# Patient Record
Sex: Female | Born: 1935 | Race: White | Hispanic: No | Smoking: Never smoker
Health system: Southern US, Academic
[De-identification: ages and names within clinical notes are randomized; demographics above are authoritative.]

## PROBLEM LIST (undated history)

## (undated) DIAGNOSIS — E669 Obesity, unspecified: Secondary | ICD-10-CM

## (undated) DIAGNOSIS — E119 Type 2 diabetes mellitus without complications: Secondary | ICD-10-CM

## (undated) DIAGNOSIS — I4891 Unspecified atrial fibrillation: Secondary | ICD-10-CM

## (undated) DIAGNOSIS — M129 Arthropathy, unspecified: Secondary | ICD-10-CM

## (undated) DIAGNOSIS — H409 Unspecified glaucoma: Secondary | ICD-10-CM

## (undated) DIAGNOSIS — M25561 Pain in right knee: Secondary | ICD-10-CM

## (undated) DIAGNOSIS — Z973 Presence of spectacles and contact lenses: Secondary | ICD-10-CM

## (undated) DIAGNOSIS — H9190 Unspecified hearing loss, unspecified ear: Secondary | ICD-10-CM

## (undated) DIAGNOSIS — M1990 Unspecified osteoarthritis, unspecified site: Secondary | ICD-10-CM

## (undated) DIAGNOSIS — I639 Cerebral infarction, unspecified: Secondary | ICD-10-CM

## (undated) DIAGNOSIS — I10 Essential (primary) hypertension: Secondary | ICD-10-CM

## (undated) DIAGNOSIS — IMO0002 Reserved for concepts with insufficient information to code with codable children: Secondary | ICD-10-CM

## (undated) DIAGNOSIS — Z96659 Presence of unspecified artificial knee joint: Secondary | ICD-10-CM

## (undated) DIAGNOSIS — T8459XA Infection and inflammatory reaction due to other internal joint prosthesis, initial encounter: Secondary | ICD-10-CM

## (undated) HISTORY — DX: Infection and inflammatory reaction due to other internal joint prosthesis, initial encounter: T84.59XA

## (undated) HISTORY — PX: JOINT REPLACEMENT: SHX530

## (undated) HISTORY — DX: Presence of unspecified artificial knee joint: Z96.659

## (undated) HISTORY — PX: FRACTURE SURGERY: SHX138

## (undated) HISTORY — PX: HX HYSTERECTOMY: SHX81

## (undated) HISTORY — PX: HX HIP REPLACEMENT: SHX124

## (undated) HISTORY — PX: HX KNEE REPLACMENT: SHX125

## (undated) HISTORY — PX: HX VEIN STRIPPING: SHX48

---

## 2012-07-08 HISTORY — PX: ORIF FEMUR DECOMPRESSION: SHX220

## 2013-05-31 ENCOUNTER — Emergency Department (HOSPITAL_BASED_OUTPATIENT_CLINIC_OR_DEPARTMENT_OTHER): Payer: Medicare Other

## 2013-05-31 ENCOUNTER — Inpatient Hospital Stay (HOSPITAL_BASED_OUTPATIENT_CLINIC_OR_DEPARTMENT_OTHER)
Admission: EM | Admit: 2013-05-31 | Discharge: 2013-06-09 | DRG: 481 | Disposition: A | Discharge status: Skilled Nursing Facility | Payer: Medicare Other | Attending: Orthopaedic Surgery | Admitting: Orthopaedic Surgery

## 2013-05-31 ENCOUNTER — Encounter (HOSPITAL_BASED_OUTPATIENT_CLINIC_OR_DEPARTMENT_OTHER): Payer: Self-pay

## 2013-05-31 DIAGNOSIS — D62 Acute posthemorrhagic anemia: Secondary | ICD-10-CM | POA: Diagnosis not present

## 2013-05-31 DIAGNOSIS — M9701XA Periprosthetic fracture around internal prosthetic right hip joint, initial encounter: Secondary | ICD-10-CM | POA: Diagnosis present

## 2013-05-31 DIAGNOSIS — I4891 Unspecified atrial fibrillation: Secondary | ICD-10-CM | POA: Diagnosis present

## 2013-05-31 DIAGNOSIS — Z96659 Presence of unspecified artificial knee joint: Secondary | ICD-10-CM

## 2013-05-31 DIAGNOSIS — Z96649 Presence of unspecified artificial hip joint: Secondary | ICD-10-CM

## 2013-05-31 DIAGNOSIS — Y9229 Other specified public building as the place of occurrence of the external cause: Secondary | ICD-10-CM

## 2013-05-31 DIAGNOSIS — I69998 Other sequelae following unspecified cerebrovascular disease: Secondary | ICD-10-CM

## 2013-05-31 DIAGNOSIS — T84029A Dislocation of unspecified internal joint prosthesis, initial encounter: Secondary | ICD-10-CM | POA: Diagnosis present

## 2013-05-31 DIAGNOSIS — H409 Unspecified glaucoma: Secondary | ICD-10-CM | POA: Diagnosis present

## 2013-05-31 DIAGNOSIS — R29898 Other symptoms and signs involving the musculoskeletal system: Secondary | ICD-10-CM | POA: Diagnosis present

## 2013-05-31 DIAGNOSIS — M129 Arthropathy, unspecified: Secondary | ICD-10-CM | POA: Diagnosis present

## 2013-05-31 DIAGNOSIS — Z6831 Body mass index (BMI) 31.0-31.9, adult: Secondary | ICD-10-CM

## 2013-05-31 DIAGNOSIS — Z23 Encounter for immunization: Secondary | ICD-10-CM

## 2013-05-31 DIAGNOSIS — E119 Type 2 diabetes mellitus without complications: Secondary | ICD-10-CM | POA: Diagnosis present

## 2013-05-31 DIAGNOSIS — Z794 Long term (current) use of insulin: Secondary | ICD-10-CM

## 2013-05-31 DIAGNOSIS — Z7901 Long term (current) use of anticoagulants: Secondary | ICD-10-CM

## 2013-05-31 HISTORY — DX: Obesity, unspecified: E66.9

## 2013-05-31 HISTORY — DX: Unspecified atrial fibrillation (CMS HCC): I48.91

## 2013-05-31 HISTORY — DX: Reserved for concepts with insufficient information to code with codable children: IMO0002

## 2013-05-31 HISTORY — DX: Presence of spectacles and contact lenses: Z97.3

## 2013-05-31 HISTORY — DX: Arthropathy, unspecified: M12.9

## 2013-05-31 HISTORY — DX: Cerebral infarction, unspecified (CMS HCC): I63.9

## 2013-05-31 HISTORY — DX: Unspecified glaucoma: H40.9

## 2013-05-31 HISTORY — DX: Unspecified hearing loss, unspecified ear: H91.90

## 2013-05-31 HISTORY — DX: Unspecified osteoarthritis, unspecified site: M19.90

## 2013-05-31 HISTORY — DX: Essential (primary) hypertension: I10

## 2013-05-31 HISTORY — DX: Type 2 diabetes mellitus without complications (CMS HCC): E11.9

## 2013-05-31 HISTORY — DX: Pain in right knee: M25.561

## 2013-05-31 MED ORDER — MORPHINE 4 MG/ML INJECTION SYRINGE
4.00 mg | INJECTION | INTRAMUSCULAR | Status: DC | PRN
Start: 2013-05-31 — End: 2013-06-09

## 2013-05-31 MED ORDER — SODIUM CHLORIDE 0.9 % (FLUSH) INJECTION SYRINGE
10.00 mL | INJECTION | Freq: Three times a day (TID) | INTRAMUSCULAR | Status: DC
Start: 2013-06-01 — End: 2013-06-09
  Administered 2013-06-01: 0 mL via INTRAVENOUS
  Administered 2013-06-01: 10 mL via INTRAVENOUS
  Administered 2013-06-01 – 2013-06-02 (×5): 0 mL via INTRAVENOUS
  Administered 2013-06-03 (×2): 10 mL via INTRAVENOUS
  Administered 2013-06-03 – 2013-06-04 (×2): 0 mL via INTRAVENOUS
  Administered 2013-06-04 – 2013-06-07 (×10): 10 mL via INTRAVENOUS
  Administered 2013-06-07: 0 mL via INTRAVENOUS
  Administered 2013-06-08 – 2013-06-09 (×4): 10 mL via INTRAVENOUS

## 2013-05-31 MED ORDER — SODIUM CHLORIDE 0.45 % INTRAVENOUS SOLUTION
INTRAVENOUS | Status: DC
Start: 2013-06-01 — End: 2013-06-04

## 2013-05-31 MED ORDER — OXYCODONE-ACETAMINOPHEN 5 MG-325 MG TABLET
1.00 | ORAL_TABLET | ORAL | Status: DC | PRN
Start: 2013-05-31 — End: 2013-06-09
  Administered 2013-06-01 – 2013-06-09 (×13): 1 via ORAL
  Filled 2013-05-31 (×13): qty 1

## 2013-05-31 MED ORDER — NITROGLYCERIN 0.4 MG SUBLINGUAL TABLET
0.40 mg | SUBLINGUAL_TABLET | SUBLINGUAL | Status: DC | PRN
Start: 2013-05-31 — End: 2013-06-09

## 2013-05-31 MED ORDER — TRAVOPROST 0.004 % EYE DROPS
1.00 [drp] | Freq: Every evening | OPHTHALMIC | Status: DC
Start: 2013-06-01 — End: 2013-06-03
  Administered 2013-06-01: 1 [drp] via OPHTHALMIC
  Administered 2013-06-02: 0 [drp] via OPHTHALMIC
  Filled 2013-05-31: qty 2.5

## 2013-05-31 MED ORDER — SODIUM CHLORIDE 0.9 % (FLUSH) INJECTION SYRINGE
10.0000 mL | INJECTION | Freq: Three times a day (TID) | INTRAMUSCULAR | Status: DC
Start: 2013-06-01 — End: 2013-06-01

## 2013-05-31 MED ORDER — ACETAMINOPHEN 325 MG TABLET
650.00 mg | ORAL_TABLET | Freq: Four times a day (QID) | ORAL | Status: DC | PRN
Start: 2013-05-31 — End: 2013-06-09

## 2013-05-31 MED ORDER — GLIPIZIDE 5 MG TABLET - EAST
5.00 mg | ORAL_TABLET | Freq: Every morning | ORAL | Status: DC
Start: 2013-06-01 — End: 2013-06-01

## 2013-05-31 MED ORDER — MORPHINE 4 MG/ML INJECTION SYRINGE
4.00 mg | INJECTION | INTRAMUSCULAR | Status: DC | PRN
Start: 2013-05-31 — End: 2013-06-01
  Administered 2013-06-01 (×2): 4 mg via INTRAVENOUS
  Filled 2013-05-31 (×3): qty 1

## 2013-05-31 MED ORDER — ATENOLOL 50 MG TABLET
50.0000 mg | ORAL_TABLET | Freq: Every day | ORAL | Status: DC
Start: 2013-06-01 — End: 2013-06-09
  Administered 2013-06-01 – 2013-06-09 (×9): 50 mg via ORAL
  Filled 2013-05-31 (×9): qty 1

## 2013-05-31 NOTE — ED Provider Notes (Signed)
 Janus Ozell RAMAN, MD  [facility]  Emergency Department Visit Note    Date:  05/31/2013  Primary care provider:  Pcp Not In System  Means of arrival:  ambulance  History obtained from: patient and husband   History limited by: none    Chief Complaint:  Fall     HISTORY OF PRESENT ILLNESS     Bridget Hall, date of birth 06/17/1925, is a 79 y.o. female who presents to the Emergency Department via EMS post ground level fall. The patient's husband states the patient slipped and fell when in their hotel bathroom. He states she had bilateral knee replacements and believes she may have dislocated her right knee. The patient states that when she fell her right leg, from the knee down, was at a 90 degree angle away from her body. She states she is now experiencing pain to her right knee that she rates as 10/10. She denies any hip pain, and states she did not hit her head and had no loss of consciousness.     REVIEW OF SYSTEMS     The pertinent positive and negative symptoms are as per HPI. All other systems reviewed and are negative.     PATIENT HISTORY     Past Medical History:  Past Medical History   Diagnosis Date   . A-fib    . Diabetes mellitus        Past Surgical History:  Past Surgical History   Procedure Laterality Date   . Hx hip replacement Right    . Hx knee replacment Bilateral    . Hx hysterectomy         Family History:  No family history of acute illness pertaining to the current visit given at this time.     Social History:  History   Substance Use Topics   . Smoking status: Never Smoker    . Smokeless tobacco: Not on file   . Alcohol Use: No     History   Drug Use No       Medications:  Previous Medications    No medications on file       Allergies:  No Known Allergies    PHYSICAL EXAM     Vitals:  Filed Vitals:    05/31/13 2231   BP: 126/88   Pulse: 82   Temp: 36.4 C (97.6 F)   Resp: 18   SpO2: 95%       Pulse ox  95% on None (Room Air) interpreted by me as: Normal    Constitutional:  The patient is alert and oriented to person, place, and time. Well-developed and well-nourished.  HENT: Atraumatic, normocephalic head. Mucous membranes moist. TM's clear, Nares unremarkable. Oropharynx shows no erythema or exudate.   Eyes: Pupils equal and round, reactive to light. No scleral icterus. Normal conjunctiva. Extraocular movements are intact.  Neck: Supple, non-tender, no nuchal rigidity, no adenopathy.   Lungs: Clear to auscultation bilaterally. Symmetric and equal expansion. No respiratory distress or retractions.  Cardiovascular: Heart is S1-S2 regular rate and regular rhythm without murmur click or rub.  Abdomen:  Soft, non-distended. No tenderness to palpation without evidence of rebound or guarding. No pulsatile masses. No organomegaly.   Genitourinary: No CVA tenderness.  Extremities: No clubbing, cyanosis, or edema. Pulses 2+, capillary refill <2 seconds. Right leg is shorter than the left with an obvious deformity at the knee.   Spine: No midline or paraspinal muscle tenderness to palpation. No step-off.  Skin: Warm and dry. No cyanosis, jaundice, rash or lesion.  Neurologic: Alert and oriented x3. Normal facial symmetry and speech, Normal upper and lower extremity strength, and grossly normal sensation.     DIFFERENTIAL DIAGNOSES     1. Knee dislocation   2. Knee fracture   3. Prosthetic malfunction   4. Knee contusion      DIAGNOSTIC STUDIES     Labs:    Results for orders placed during the hospital encounter of 05/31/13   CBC       Result Value Range    WBC 11.9 (*) 4.0 - 11.0 K/uL    RBC 4.92  4.00 - 5.10 M/uL    HGB 15.8 (*) 12.0 - 15.5 g/dL    HCT 50.3 (*) 63.9 - 45.0 %    MCV 101.0 (*) 82.0 - 97.0 fL    MCH 32.1  28.0 - 34.0 pg    MCHC 31.9 (*) 33.0 - 37.0 g/dL    RDW 87.2  88.9 - 86.9 %    PLATELET COUNT 284  150 - 400 K/uL    MPV 6.5 (*) 7.0 - 9.4 fL    PMN % 84.8 (*) 43.0 - 76.0 %    LYMPHOCYTE % 10.3 (*) 15.0 - 43.0 %    MONOCYTE % 3.9 (*) 4.8 - 12.0 %    EOSINOPHIL % 0.4  0.0 - 5.2  %    BASOPHILS % 0.5  0.0 - 1.4 %    PMN # 10.10 (*) 1.50 - 6.50 K/uL    LYMPHOCYTE # 1.23  0.70 - 3.20 K/uL    MONOCYTE # 0.47  0.20 - 0.90 K/uL    EOSINOPHIL # 0.05  0.00 - 0.50 K/uL    BASOPHIL # 0.06  0.00 - 0.10 K/uL   COMPREHENSIVE METABOLIC PROFILE - [facility] ONLY       Result Value Range    GLUCOSE 124 (*) 70 - 110 mg/dL    BUN 27 (*) 6 - 22 mg/dL    CREATININE 9.03  9.46 - 1.00 mg/dL    ESTIMATED GLOMERULAR FILTRATION RATE 55 (*) >60 ml/min    SODIUM 144  136 - 145 mmol/L    POTASSIUM 4.3  3.5 - 5.0 mmol/L    CHLORIDE 108  101 - 111 mmol/L    CARBON DIOXIDE 28  22 - 32 mmol/L    CALCIUM 9.4  8.5 - 10.5 mg/dL    TOTAL PROTEIN 5.9 (*) 6.0 - 8.0 g/dL    ALBUMIN 3.6  3.2 - 5.0 g/dL    BILIRUBIN, TOTAL 1.7 (*) 0.0 - 1.3 mg/dL    AST (SGOT) 20  0 - 45 IU/L    ALT (SGPT) 16  0 - 55 IU/L    ALKALINE PHOSPHATASE 102  35 - 120 IU/L   POC TROPONIN I BEDSIDE - [facility] ONLY       Result Value Range    TROPONIN I BEDSIDE - [facility] ONLY <0.05  <0.05 ng/mL   PT/INR       Result Value Range    PROTHROMBIN TIME 36.8 (*) 9.8 - 11.0 sec    INR NORMALIZED 3.36     APTT,THERAPEUTIC - [facility] ONLY       Result Value Range    THERAPEUTIC APTT 36.3 (*) 45.6 - 74.8 sec    HEPARIN DOSE UNK      HEPARIN-LAST DOSE DATE UNK      TIME HEPARIN UNK       Labs  reviewed and interpreted by me.     Radiology:    XR KNEE RIGHT AP, LAT & BOTH OBLIQUES, 4-VIEWS: Acute fractures.   Interpreted by me.    XR CHEST AP PORTABLE, 1+VIEW: No obvious infiltrate.   Interpreted by me.     EKG:  12 lead EKG interpreted by me shows atrial fibrillation rhythm, rate of 98 bpm, normal axis, normal interval, nonspecific ST and T wave abnormalities.     ED PROGRESS NOTE / MEDICAL DECISION MAKING     Orders Placed This Encounter   . XR KNEE RIGHT AP, LAT & BOTH OBLIQUES ([facility] ROUTINE KNEE EXAM)   . XR CHEST AP PORTABLE (If patient condition warrants)   . CBC   . COMPREHENSIVE METABOLIC PROFILE - [facility] ONLY   . POCT TROPONIN I BEDSIDE - [facility] ONLY   . PT/INR   . APTT,  Therapeutic   . ECG 12-LEAD (Take to provider with a brief history)   . INSERT & MAINTAIN PERIPHERAL IV ACCESS   . INSERT & MAINTAIN PERIPHERAL IV ACCESS   . NS flush syringe       Right knee x-ray ordered.    10:38 PM - Initial evaluation completed at this time. Above diagnostic studies ordered.     10:59 PM - I have reviewed the patient's x-ray. Dr. Arvell (Orthopedist) paged.     11:10 PM - I discussed the patient's case and above findings with Dr. Arvell (Orthopedist) who has agreed to admit the patient for further evaluation and treatment of her symptoms. Labs, EKG, and chest x-ray ordered.     11:18 PM - I explained the results of the patient's x-ray and my conversation with Dr. Arvell with the patient and her husband. They understood and are agreeable with the admission. All of their questions have been answered to their satisfaction.       Pre-Disposition Vitals:  Filed Vitals:    05/31/13 2231   BP: 126/88   Pulse: 82   Temp: 36.4 C (97.6 F)   Resp: 18   SpO2: 95%       CLINICAL IMPRESSION     Encounter Diagnosis   Name Primary?   . Femur fracture, right Yes     DISPOSITION/PLAN     Admitted          Condition at Disposition: Fair        SCRIBE ATTESTATION STATEMENT  I Ray Chandler, SCRIBE scribed for Janus Ozell RAMAN, MD on 05/31/2013 at 10:35 PM.     Documentation assistance provided for Janus Ozell RAMAN, MD  by Ray Chandler, SCRIBE. Information recorded by the scribe was done at my direction and has been reviewed and validated by me Janus Ozell RAMAN, MD.

## 2013-05-31 NOTE — ED Nurses Note (Signed)
Report to [facility] on ortho Pt to be transported to room

## 2013-05-31 NOTE — ED Nurses Note (Signed)
EMS states pt was GLF on tile floor in the bathroom, denies LOC. Pt has bilateral knee replacements and the R knee is the knee that is in pain and has some deformity compared to the L knee. Pt was given 150cc of NS and 50 mcg of Fentanyl en route. Pt denies any hip pain.

## 2013-06-01 ENCOUNTER — Encounter (HOSPITAL_BASED_OUTPATIENT_CLINIC_OR_DEPARTMENT_OTHER): Payer: Self-pay

## 2013-06-01 LAB — PT/INR
INR NORMALIZED: 1.77
INR NORMALIZED: 3.36
PROTHROMBIN TIME: 18.8 s (ref 9.8–11.0)
PROTHROMBIN TIME: 36.8 s — ABNORMAL HIGH (ref 9.8–11.0)

## 2013-06-01 LAB — CBC
BASOPHIL #: 0.06 K/uL (ref 0.00–0.10)
BASOPHILS %: 0.5 % (ref 0.0–1.4)
EOSINOPHIL #: 0.05 K/uL (ref 0.00–0.50)
EOSINOPHIL %: 0.4 % (ref 0.0–5.2)
HCT: 49.6 % — ABNORMAL HIGH (ref 36.0–45.0)
HGB: 15.8 g/dL — ABNORMAL HIGH (ref 12.0–15.5)
LYMPHOCYTE #: 1.23 K/uL (ref 0.70–3.20)
LYMPHOCYTE %: 10.3 % — ABNORMAL LOW (ref 15.0–43.0)
MCH: 32.1 pg (ref 28.0–34.0)
MCHC: 31.9 g/dL — ABNORMAL LOW (ref 33.0–37.0)
MCV: 101 fL — ABNORMAL HIGH (ref 82.0–97.0)
MONOCYTE #: 0.47 K/uL (ref 0.20–0.90)
MONOCYTE %: 3.9 % — ABNORMAL LOW (ref 4.8–12.0)
MPV: 6.5 fL — ABNORMAL LOW (ref 7.0–9.4)
PLATELET COUNT: 284 10*3/uL (ref 150–400)
PMN #: 10.1 K/uL — ABNORMAL HIGH (ref 1.50–6.50)
PMN %: 84.8 % — ABNORMAL HIGH (ref 43.0–76.0)
RBC: 4.92 M/uL (ref 4.00–5.10)
RDW: 12.7 % (ref 11.0–13.0)
WBC: 11.9 10*3/uL — ABNORMAL HIGH (ref 4.0–11.0)

## 2013-06-01 LAB — ZZAPTT, THERAPUTIC: THERAPEUTIC APTT: 36.3 s — ABNORMAL LOW (ref 45.6–74.8)

## 2013-06-01 LAB — COMPREHENSIVE METABOLIC PROFILE - BMC/JMC ONLY
ALBUMIN: 3.6 g/dL (ref 3.2–5.0)
ALKALINE PHOSPHATASE: 102 IU/L (ref 35–120)
ALT (SGPT): 16 IU/L (ref 0–55)
AST (SGOT): 20 IU/L (ref 0–45)
BILIRUBIN, TOTAL: 1.7 mg/dL — ABNORMAL HIGH (ref 0.0–1.3)
BUN: 27 mg/dL — ABNORMAL HIGH (ref 6–22)
CALCIUM: 9.4 mg/dL (ref 8.5–10.5)
CARBON DIOXIDE: 28 mmol/L (ref 22–32)
CHLORIDE: 108 mmol/L (ref 101–111)
CREATININE: 0.96 mg/dL (ref 0.53–1.00)
ESTIMATED GLOMERULAR FILTRATION RATE: 55 mL/min — ABNORMAL LOW (ref >60)
GLUCOSE: 124 mg/dL — ABNORMAL HIGH (ref 70–110)
POTASSIUM: 4.3 mmol/L (ref 3.5–5.0)
SODIUM: 144 mmol/L (ref 136–145)
TOTAL PROTEIN: 5.9 g/dL — ABNORMAL LOW (ref 6.0–8.0)

## 2013-06-01 LAB — PERFORM POC FINGERSTICK GLUCOSE
BLD GLUCOSE POCT: 129 mg/dL — ABNORMAL HIGH (ref 60–100)
BLD GLUCOSE POCT: 124 mg/dL — ABNORMAL HIGH (ref 60–100)

## 2013-06-01 LAB — HGA1C (HEMOGLOBIN A1C WITH EST AVG GLUCOSE)
ESTIMATED AVERAGE GLUCOSE: 114 mg/dL — ABNORMAL HIGH (ref 70–110)
GLYCOHEMOGLOBIN: 5.6 % (ref 4.0–6.0)

## 2013-06-01 LAB — POC TROPONIN I BEDSIDE - BMC ONLY: TROPONIN I BEDSIDE - CITY ONLY: <0.05 ng/mL (ref <0.05)

## 2013-06-01 LAB — APTT,THERAPEUTIC

## 2013-06-01 MED ORDER — PHYTONADIONE (VITAMIN K1) 10 MG/ML INJECTION SOLUTION
10.0000 mg | Freq: Once | INTRAVENOUS | Status: AC
Start: 2013-06-01 — End: 2013-06-01
  Administered 2013-06-01: 10 mg via INTRAVENOUS
  Filled 2013-06-01: qty 1

## 2013-06-01 MED ORDER — INSULIN GLULISINE (U-100) 100 UNIT/ML SUBCUTANEOUS SOLUTION
5.00 [IU] | Freq: Three times a day (TID) | SUBCUTANEOUS | Status: DC
Start: 2013-06-01 — End: 2013-06-09
  Administered 2013-06-01 (×2): 5 [IU] via SUBCUTANEOUS
  Administered 2013-06-02 – 2013-06-03 (×4): 0 [IU] via SUBCUTANEOUS
  Administered 2013-06-03: 5 [IU] via SUBCUTANEOUS
  Administered 2013-06-03: 0 [IU] via SUBCUTANEOUS
  Administered 2013-06-04 (×3): 5 [IU] via SUBCUTANEOUS
  Administered 2013-06-05 (×2): 0 [IU] via SUBCUTANEOUS
  Administered 2013-06-05 – 2013-06-06 (×2): 5 [IU] via SUBCUTANEOUS
  Administered 2013-06-06: 0 [IU] via SUBCUTANEOUS
  Administered 2013-06-06 – 2013-06-07 (×2): 5 [IU] via SUBCUTANEOUS
  Administered 2013-06-07: 0 [IU] via SUBCUTANEOUS
  Administered 2013-06-07 – 2013-06-08 (×2): 5 [IU] via SUBCUTANEOUS
  Administered 2013-06-08 (×2): 0 [IU] via SUBCUTANEOUS
  Administered 2013-06-09: 5 [IU] via SUBCUTANEOUS
  Filled 2013-06-01 (×27): qty 5

## 2013-06-01 MED ORDER — MORPHINE 4 MG/ML INJECTION SYRINGE
4.00 mg | INJECTION | INTRAMUSCULAR | Status: DC | PRN
Start: 2013-06-01 — End: 2013-06-09
  Administered 2013-06-01 – 2013-06-05 (×6): 4 mg via INTRAVENOUS
  Filled 2013-06-01 (×6): qty 1

## 2013-06-01 MED ORDER — INSULIN GLARGINE (U-100) 100 UNIT/ML SUBCUTANEOUS SOLUTION
15.00 [IU] | Freq: Two times a day (BID) | SUBCUTANEOUS | Status: DC
Start: 2013-06-01 — End: 2013-06-09
  Administered 2013-06-01: 15 [IU] via SUBCUTANEOUS
  Administered 2013-06-01 – 2013-06-02 (×2): 0 [IU] via SUBCUTANEOUS
  Administered 2013-06-02 – 2013-06-05 (×6): 15 [IU] via SUBCUTANEOUS
  Administered 2013-06-05 – 2013-06-06 (×2): 0 [IU] via SUBCUTANEOUS
  Administered 2013-06-06: 15 [IU] via SUBCUTANEOUS
  Administered 2013-06-07: 0 [IU] via SUBCUTANEOUS
  Administered 2013-06-07: 15 [IU] via SUBCUTANEOUS
  Administered 2013-06-08: 0 [IU] via SUBCUTANEOUS
  Administered 2013-06-08 – 2013-06-09 (×2): 15 [IU] via SUBCUTANEOUS
  Filled 2013-06-01 (×17): qty 15

## 2013-06-01 MED ADMIN — sodium chloride 0.9 % (flush) injection syringe: 0 mL | INTRAVENOUS

## 2013-06-01 NOTE — Ortho Tech (Signed)
 Inserted foley cath per doc order on 06/01/13 at 0230. Patient tolerated well.

## 2013-06-01 NOTE — CDI REVIEW (Signed)
[facility] - Initial Review     Working DRG 1:  561  Dx:  Displaced Knee Prosthesis [phone], Afib 42731, Coumadin Tox [id number], DM 25000    Bun 27, Creat 0.96, GFR 55

## 2013-06-01 NOTE — Nurses Notes (Signed)
 Pt arrived on floor via stretcher from ED. VSS.  Duwaine Gaskins, RN

## 2013-06-01 NOTE — Care Plan (Signed)
Problem: General Plan of Care(Adult,OB)  Goal: Plan of Care Review(Adult,OB)  The patient and/or their representative will communicate an understanding of their plan of care   Outcome: Ongoing (see interventions/notes)  Pt verbalizes understanding of plan of care thus far. Has been medicated for pain per MD order. IV infusing w/no difficulties. F/C patent. Bucks traction 7lbs placed to RLE. Neurovascularly intact. Possible surgical intervention today pending INR results. Pt resting comfortably at this time. Will continue to monitor.  Bobbye Riggs, RN

## 2013-06-01 NOTE — Nurses Notes (Signed)
 Pt home medications sent to pharmacy.  Duwaine Gaskins, RN

## 2013-06-01 NOTE — Consults (Signed)
[facility]  [place], [place] [address]    MEDICINE CONSULT    [place], [place], 79 y.o. female  Date of Admission:  05/31/2013  Date of service: 06/01/2013  Date of Birth:  23-Jun-1925    Hospital Day:  LOS: 1 day     Service: Orthopedics  Requesting MD: Dr. Margaretha Sheffield    Information Obtained from: patient  Chief Complaint:  Right knee pain    Assessment/Recommendations:     1.  Right knee pain:  Xray shows hardware with acute fracture.  INR > 3. Would hold surgical intervention until INR < 1.5.  No active cardiopulmonary disease.  Medium risk for intermediate procedure.    2.  Atrial fibrillation anticoagulated with coumadin:  Continue rate control with atenolol.  Hold coumadin.  Will give vitamin k 10 mg x 1     3.  DMII:  Follow accuchecks.  Hold glipizide will treat with insulin while inpatient    4.  Disposition:  Will follow this patient with you, thank you for allowing me to consult on this pleasant patient    HPI/Discussion:  Bridget Hall is a 79 y.o., White female who presents with fall in the bathroom.  The patient states that she was in the bathroom and the floor was slippery and she fell.  One leg, her right leg, went out from under her.   She states she has most of her pain in her right knee.  She denies any dizziness or loss of consciousness; she denies hitting her head.  She denies chest pain, shortness of breath, nausea, vomiting, denies fever or chills.  She states that she has never broken any bones in the past before, but has had 2 knee replacements and 1 hip replacement, and has had no adverse reactions to anesthesia.  Has past medical history significant for diabetes and atrial fibrillation; is anticoagulated with Coumadin; she has no other complaints at this time.          Past Medical History   Diagnosis Date    A-fib     Diabetes mellitus     HTN (hypertension)     CVA (cerebrovascular accident)     Arthropathy, unspecified, site unspecified      Past  Surgical History   Procedure Laterality Date    Hx hip replacement Right     Hx knee replacment Bilateral     Hx hysterectomy     Medications Prior to Admission    Outpatient Medications    atenolol (TENORMIN) 50 mg Oral Tablet    Take 50 mg by mouth Once a day    glipiZIDE (GLUCOTROL) 5 mg Oral Tablet    Take 5 mg by mouth Every morning before breakfast Take 30 minutes before meals    travoprost (TRAVATAN) 0.004 % Ophthalmic Drops    Instill 1 Drop into both eyes Every night    warfarin (COUMADIN) 1 mg Oral Tablet    Take 1 mg by mouth Every evening    Warfarin (COUMADIN) 4 mg Oral Tablet    Take 4 mg by mouth Every evening        Current Facility-Administered Medications:  1/2 NS premix infusion  Intravenous Continuous   acetaminophen (TYLENOL) tablet 650 mg Oral Q6H PRN   atenolol (TENORMIN) tablet 50 mg Oral Daily   glipiZIDE (GLUCOTROL) tablet 5 mg Oral Daily before Breakfast   morphine 4 mg/mL injection 4 mg Intravenous Q5 Min PRN   morphine 4 mg/mL injection  4 mg Intravenous Q4H PRN   nitroglycerin (NITROSTAT) sublingual tablet 0.4 mg Sublingual Q5 Min PRN   NS flush syringe 10 mL Intravenous Q8H   NS flush syringe 10 mL Intravenous Q8HRS   oxyCODONE-acetaminophen (PERCOCET) 5-325mg  per tablet 1 Tab Oral Q4H PRN   travoprost (TRAVATAN) 0.004% ophthalmic solution 1 Drop Both Eyes NIGHTLY   No Known Allergies  Family History  Family History   Problem Relation Age of Onset    No Known Problems Mother     No Known Problems Father        Social History  History   Substance Use Topics    Smoking status: Never Smoker     Smokeless tobacco: Not on file    Alcohol Use: No        ROS:   Other than ROS in the HPI, all other systems were negative.  10 systems reviewed    EXAM:    GENERAL:  The patient lying in bed in no acute distress.  Well-developed,  well-nourished.  Elderly WF    Eyes:  Pupils equal, round, reactive to light.   atraumatic.    NECK:  Supple.  No signs of jugular venous  distention.    LYMPHATICS:  No cervical or axillary lymphadenopathy palpated in either  respective chain.    Respiratory:  Clear to auscultation bilaterally.  Normal effort on inspiration.    Cardiovascular:  S1, S2, regular rate and rhythm, no murmurs, rubs, or gallops.   2+ pulses bilateral upper and lower extremities.    GI:  Obese, soft, nontender, nondistended, positive bowel sounds.    Musculoskeletal :  No clubbing, cyanosis, or edema.  Full ROM in all extremities    NEUROLOGICAL:  Cranial nerves II-XII grossly intact.  No focal deficits.    PSYCHIATRIC:  The patient is alert and oriented x 3, with normal affect.    SKIN:  Intact.  No signs of rash.      Labs:    Lab Results for Last 24 Hours:    Results for orders placed during the hospital encounter of 05/31/13 (from the past 24 hour(s))   CBC       Result Value Range    WBC 11.9 (*) 4.0 - 11.0 K/uL    RBC 4.92  4.00 - 5.10 M/uL    HGB 15.8 (*) 12.0 - 15.5 g/dL    HCT 16.1 (*) 09.6 - 45.0 %    MCV 101.0 (*) 82.0 - 97.0 fL    MCH 32.1  28.0 - 34.0 pg    MCHC 31.9 (*) 33.0 - 37.0 g/dL    RDW 04.5  40.9 - 81.1 %    PLATELET COUNT 284  150 - 400 K/uL    MPV 6.5 (*) 7.0 - 9.4 fL    PMN % 84.8 (*) 43.0 - 76.0 %    LYMPHOCYTE % 10.3 (*) 15.0 - 43.0 %    MONOCYTE % 3.9 (*) 4.8 - 12.0 %    EOSINOPHIL % 0.4  0.0 - 5.2 %    BASOPHILS % 0.5  0.0 - 1.4 %    PMN # 10.10 (*) 1.50 - 6.50 K/uL    LYMPHOCYTE # 1.23  0.70 - 3.20 K/uL    MONOCYTE # 0.47  0.20 - 0.90 K/uL    EOSINOPHIL # 0.05  0.00 - 0.50 K/uL    BASOPHIL # 0.06  0.00 - 0.10 K/uL   COMPREHENSIVE METABOLIC PROFILE - [facility] ONLY  Result Value Range    GLUCOSE 124 (*) 70 - 110 mg/dL    BUN 27 (*) 6 - 22 mg/dL    CREATININE 5.40  9.81 - 1.00 mg/dL    ESTIMATED GLOMERULAR FILTRATION RATE 55 (*) >60 ml/min    SODIUM 144  136 - 145 mmol/L    POTASSIUM 4.3  3.5 - 5.0 mmol/L    CHLORIDE 108  101 - 111 mmol/L    CARBON DIOXIDE 28  22 - 32 mmol/L    CALCIUM 9.4  8.5 - 10.5 mg/dL    TOTAL PROTEIN 5.9 (*) 6.0 - 8.0  g/dL    ALBUMIN 3.6  3.2 - 5.0 g/dL    BILIRUBIN, TOTAL 1.7 (*) 0.0 - 1.3 mg/dL    AST (SGOT) 20  0 - 45 IU/L    ALT (SGPT) 16  0 - 55 IU/L    ALKALINE PHOSPHATASE 102  35 - 120 IU/L   POC TROPONIN I BEDSIDE - [facility] ONLY       Result Value Range    TROPONIN I BEDSIDE - [facility] ONLY <0.05  <0.05 ng/mL   PT/INR       Result Value Range    PROTHROMBIN TIME 36.8 (*) 9.8 - 11.0 sec    INR NORMALIZED 3.36     APTT,THERAPEUTIC - [facility] ONLY       Result Value Range    THERAPEUTIC APTT 36.3 (*) 45.6 - 74.8 sec    HEPARIN DOSE UNK      HEPARIN-LAST DOSE DATE UNK      TIME HEPARIN UNK         Imaging Studies:  CXR:  Direct visualization of the image on 06/01/2013 on [facility] PACS showed  normal lung fields   Other:  Right knee, Direct visualization of the image on 06/01/2013 on [facility] PACS showed:  Acute fracture

## 2013-06-01 NOTE — Progress Notes (Signed)
Dr. Willa Rough evaluated patient this am.  For elevated INR, not ready for surgery today.  Vit K given. Likely will be ready for surgery for tomorrow.

## 2013-06-01 NOTE — Care Management Notes (Signed)
 SS note: PAS completed and faxed along with capacity to Dr. Arvell for possible SNF care.

## 2013-06-01 NOTE — Care Management Notes (Signed)
 06/01/13 1500   Assessment Detail   Assessment Type Admission   Date of Care Management Update 06/01/13   Care Management  Plan   Discharge Planning Status initial meeting   Discharge Needs Assessment   Equipment Currently Used at Home walker, standard   Transportation Available car;family or friend will provide   Referral Information   Admission Type inpatient   Arrived From emergency department   Address Verified verified-no changes   Insurance Verified verified-no change   Source of Information Family (specify)   Living Environment   Lives With child(ren), adult   Living Arrangements house   Quality of Family Relationships supportive   Home Safety   Feels Safe Living in Home yes   Home Assessment: No Problems Identified     The patient lives with her son in a one story home in [place] .  They were traveling to [place]  for the holidays, and the patient fell in the hotel room.  The patient has a wheelchair and a walker at her home in [place].  She also has a walker with her in the car.  She is unable to drive, but her son assists her with transportation.  Spoke with the patient's son, Bridget Hall re: discharge plans and at this time we are unsure what she is going to need.  We anticipate that she might need SNF after discharge prior to a long car ride back home.  Provided the patient's son with a list of local SNF that might be required.  He was appreciative, and plans to research them a little more.  Will continue to follow with discharge planning.

## 2013-06-01 NOTE — Nurses Notes (Signed)
Son, Earl Many, is staying at the [facility] and has requested to be called at this room if he can not be reached by cell.  Bobbye Riggs, RN

## 2013-06-02 ENCOUNTER — Inpatient Hospital Stay (HOSPITAL_BASED_OUTPATIENT_CLINIC_OR_DEPARTMENT_OTHER): Payer: Medicare Other

## 2013-06-02 ENCOUNTER — Encounter (HOSPITAL_BASED_OUTPATIENT_CLINIC_OR_DEPARTMENT_OTHER)
Admission: EM | Disposition: A | Discharge status: Skilled Nursing Facility | Payer: Self-pay | Source: Home / Self Care | Attending: Orthopaedic Surgery

## 2013-06-02 ENCOUNTER — Encounter (HOSPITAL_BASED_OUTPATIENT_CLINIC_OR_DEPARTMENT_OTHER): Payer: Self-pay

## 2013-06-02 DIAGNOSIS — M9701XA Periprosthetic fracture around internal prosthetic right hip joint, initial encounter: Secondary | ICD-10-CM | POA: Diagnosis present

## 2013-06-02 LAB — PT/INR
INR NORMALIZED: 1.15
PROTHROMBIN TIME: 12 s (ref 9.8–11.0)

## 2013-06-02 LAB — TYPE AND SCREEN - BMC/JMC ONLY
ABO/RH(D): O POS
ANTIBODY SCREEN: NEGATIVE

## 2013-06-02 LAB — PERFORM POC FINGERSTICK GLUCOSE
BLD GLUCOSE POCT: 106 mg/dL — ABNORMAL HIGH (ref 60–100)
BLD GLUCOSE POCT: 117 mg/dL — ABNORMAL HIGH (ref 60–100)
BLD GLUCOSE POCT: 110 mg/dL — ABNORMAL HIGH (ref 60–100)

## 2013-06-02 SURGERY — OPEN REDUCTION INTERNAL FIXATION FRACTURE FEMUR
Anesthesia: General | Laterality: Right | Wound class: Clean Wound: Uninfected operative wounds in which no inflammation occurred

## 2013-06-02 MED ORDER — TRAVOPROST 0.004 % EYE DROPS
1.00 [drp] | Freq: Every evening | OPHTHALMIC | Status: DC
Start: 2013-06-02 — End: 2013-06-09
  Administered 2013-06-02: 0 [drp] via OPHTHALMIC
  Administered 2013-06-03 – 2013-06-08 (×6): 1 [drp] via OPHTHALMIC
  Filled 2013-06-02: qty 2.5

## 2013-06-02 MED ORDER — LACTATED RINGERS INTRAVENOUS SOLUTION
INTRAVENOUS | Status: DC
Start: 2013-06-02 — End: 2013-06-02

## 2013-06-02 MED ORDER — CEFAZOLIN 1 GRAM/50 ML IN DEXTROSE (ISO-OSMOTIC) INTRAVENOUS PIGGYBACK
1.00 g | INJECTION | Freq: Three times a day (TID) | INTRAVENOUS | Status: AC
Start: 2013-06-03 — End: 2013-06-03
  Administered 2013-06-03 (×2): 1 g via INTRAVENOUS
  Filled 2013-06-02 (×2): qty 50

## 2013-06-02 MED ORDER — PHENYLEPHRINE 10 MG/ML INJECTION SOLUTION
INTRAMUSCULAR | Status: AC
Start: 2013-06-02 — End: 2013-06-02
  Filled 2013-06-02: qty 1

## 2013-06-02 MED ORDER — ONDANSETRON HCL (PF) 4 MG/2 ML INJECTION SOLUTION
4.00 mg | Freq: Four times a day (QID) | INTRAMUSCULAR | Status: DC | PRN
Start: 2013-06-02 — End: 2013-06-09

## 2013-06-02 MED ORDER — ACETAMINOPHEN 1,000 MG/100 ML (10 MG/ML) INTRAVENOUS SOLUTION
1000.0000 mg | Freq: Once | INTRAVENOUS | Status: DC | PRN
Start: 2013-06-02 — End: 2013-06-02

## 2013-06-02 MED ORDER — SODIUM CHLORIDE 0.9 % IRRIGATION SOLUTION
500.00 mL | Freq: Once | Status: DC
Start: 2013-06-02 — End: 2013-06-02
  Administered 2013-06-02: 500 mL

## 2013-06-02 MED ORDER — HYDROMORPHONE 1 MG/ML INJECTION WRAPPER
0.5000 mg | INJECTION | INTRAMUSCULAR | Status: DC | PRN
Start: 2013-06-02 — End: 2013-06-02

## 2013-06-02 MED ORDER — PROPOFOL 10 MG/ML IV BOLUS
INJECTION | INTRAVENOUS | Status: AC
Start: 2013-06-02 — End: 2013-06-02
  Filled 2013-06-02: qty 50

## 2013-06-02 MED ORDER — ATENOLOL 50 MG TABLET
50.0000 mg | ORAL_TABLET | Freq: Every day | ORAL | Status: DC
Start: 2013-06-02 — End: 2013-06-02
  Filled 2013-06-02 (×3): qty 1

## 2013-06-02 MED ORDER — GLIPIZIDE 5 MG TABLET - EAST
5.00 mg | ORAL_TABLET | Freq: Every morning | ORAL | Status: DC
Start: 2013-06-03 — End: 2013-06-03
  Administered 2013-06-03: 5 mg via ORAL
  Filled 2013-06-02 (×3): qty 1

## 2013-06-02 MED ORDER — ONDANSETRON HCL (PF) 4 MG/2 ML INJECTION SOLUTION
4.0000 mg | Freq: Once | INTRAMUSCULAR | Status: DC | PRN
Start: 2013-06-02 — End: 2013-06-02

## 2013-06-02 SURGICAL SUPPLY — 29 items
4.5 CORTICAL SCREW (Screw) ×4 IMPLANT
BIT DRILL 5.5IN 3.2MM QUICK RELEASE STRL LF  DISP (SURGICAL CUTTING SUPPLIES) ×1 IMPLANT
COBALT CHROME CABLE (Cable) ×2 IMPLANT
CONV USE 65308 - DRAPE REINF FAN FOLD 77X56IN CNVRT ASTND LF  STRL DISP SURG TBRN 42X25IN (PROTECTIVE PRODUCTS/GARMENTS) ×1 IMPLANT
CONV USE ITEM 337902 - KIT SURG LRG STUP NONST DISP LF (CUSTOM TRAYS & PACK) ×1 IMPLANT
COVER REINF BCK LF  DISP 90X44IN TBL CNVRT STRL POLY (EQUIPMENT MINOR) ×1 IMPLANT
DRAPE ADH INCS FLXB FILM 13X13IN MED STRDRP LF  STRL DISP SURG PLASTIC CLR (PROTECTIVE PRODUCTS/GARMENTS) ×1 IMPLANT
DRAPE CARM POLY STRAP MBL XRY 72X42IN LF  EQP (DRAPE/PACKS/SHEETS/OR TOWEL) ×1 IMPLANT
DRAPE CORD HLD TAB ABS REINF F ENESTRATE 128.5X87IN XTRMT 2.5 (PROTECTIVE PRODUCTS/GARMENTS) ×1 IMPLANT
DRAPE REINF FAN FOLD 77X56IN CNVRT ASTND LF  STRL DISP SURG TBRN 42X25IN (PROTECTIVE PRODUCTS/GARMENTS) ×1
DRESSING XEROFOAM 4 X 4 25/BX 8884433500 ST FOIL PK (WOUND CARE SUPPLY) ×1 IMPLANT
DRESSING XEROFORM 5X9 50/BX 8884431605 (WOUND CARE SUPPLY) ×1 IMPLANT
Dynamic Comprpression Cable Plate (Plate) ×1 IMPLANT
ELECTRODE ESURG BLADE PNCL 10FT VLAB STRL SS DISP RCKR SWH HEX LOCK CORD HLSTR LF  ACPT 3/32IN STD (CAUTERY SUPPLIES) ×1 IMPLANT
GLOVE SURG 7.5 LTX ORTHO PWDR BEAD CUF ANSLP STRL BRN 12IN (GLOVES AND ACCESSORIES) ×3 IMPLANT
GLOVE SURG 8 LTX 3 PLN MLD PWD R BEAD CUF ANSLP STRL BRN 12IN (GLOVES AND ACCESSORIES) ×5 IMPLANT
GLOVE SURG TRIFLEX LATEX 8.0 2D7255 40PR/BX STRL PWD (GLOVES AND ACCESSORIES) ×1 IMPLANT
GOWN SURG XL AAMI L4 IMPRV REI NF BRTHBL STRL LF DISP CNVRT (PROTECTIVE PRODUCTS/GARMENTS) ×3 IMPLANT
PAD ABD CURITY 7 1/2 X 8 7197D 18EA/BX 12BX/CS (WOUND CARE SUPPLY) ×2 IMPLANT
SOL IRRG 0.9% NACL 500ML PLASTIC PR BTL ISTNC N-PYRG STRL LF (SOLUTIONS) ×2 IMPLANT
SPONGE GAUZE STRL 4 X 4IN TUB_6939 1280/CS (WOUND CARE SUPPLY) ×1 IMPLANT
SPONGE LAP 18X18IN STRL (WOUND CARE SUPPLY) ×1 IMPLANT
STAPLER SKIN SIGNET 35W 054006 DISP 6EA/BX (ENDOSCOPIC SUPPLIES) ×1 IMPLANT
STKNT ORTHO 48X12IN IMPRV PUL TAB HOLLOW LF  STRL (ORTHOPEDICS (NOT IMPLANTS)) ×1 IMPLANT
SUTURE 0 CT1 COAT VICRYL 27IN VIOL BRD ABS (SUTURE/WOUND CLOSURE) ×4 IMPLANT
SUTURE 2-0 CT1 VICRYL 36IN VIOL BRD COAT ABS (SUTURE/WOUND CLOSURE) ×3 IMPLANT
SUTURE 3-0 PS-1 ETHILON 18.0I_N BLK NYLON MONOF NYL N/ABSB (SUTURE/WOUND CLOSURE) ×2 IMPLANT
TRAY SKIN SCRUB 8IN VNYL COTTON 6 WNG 6 SPONGE STICK 2 TIP APPL DRY STRL LF (KITS & TRAYS (DISPOSABLE)) IMPLANT
cobalt chrome cable (Cable) ×3 IMPLANT

## 2013-06-02 NOTE — Nurses Notes (Signed)
Pt taken down for surgery at this time.

## 2013-06-02 NOTE — Care Management Notes (Signed)
SS: Spoke with Michelle Piper, patient's son, concerning possible placement. Michelle Piper and his brother are still exploring options for their mother due to one residing in [place] and the other in [place]. He understands that it will be beneficial for his mother but he wants to make a thoughtful decision since no one resides in [place]. I related that myself or Shauna, CM would make contact with him to follow up.

## 2013-06-02 NOTE — Care Management Notes (Signed)
SS: Spoke with patient concerning possible nursing home. Patient related that she had not previously met with Shauna, CM concerning possible placements. Patient expressed that she'd like to have her surgery, be here a few days afterwards, and then return to [place]. Informed patient that I would consult with Brayton Caves, LSW since patient seemed confused of possibly doing SNF rehab.

## 2013-06-02 NOTE — Care Management Notes (Signed)
SS note: PAS submitted.

## 2013-06-02 NOTE — Nurses Notes (Signed)
Anesthesia start time: 1352  Pre op antibiotic: Ancef 1gm IVPB @ 1352  Anesthesia end time: 1740  Pt transferred to PACU @ 1735. Pt alert, awake and oriented. Denies pain. Ice pack to right outer thigh.   Report called to [facility], RN @ [phone].

## 2013-06-02 NOTE — Brief Op Note (Signed)
[facility]  [place], [place] [address]                                                                   BRIEF OPERATIVE NOTE    Patient Name: [facility] Number: [id number]  Date of Service: 06/02/2013   Date of Birth: Nov 07, 1924      Pre-Operative Diagnosis : PERIPROSTHETIC  R FEMUR FX    Post-Operative Diagnosis: SAME    Procedure(s)/Description:  Procedure(s):  OPEN REDUCTION INTERNAL FIXATION FRACTURE FEMUR    Findings: BADLY COMMINUTED    Attending Surgeon: Alveta Heimlich, MD    Estimated Blood Loss:  900 CC    Drains:  0                Complications:  0           Disposition: PACU - hemodynamically stable.           Condition: stable    Patient is at increased risk for surgical bleeding:  No    Patient is on postop antibiotic regimen greater than 24 hours:  No

## 2013-06-02 NOTE — Care Plan (Signed)
 Problem: Fractured Hip (Adult)  Prevent and manage potential problems including:1. acute cognitive dysfunction leading to delirium/acute confusion2. acute pain3. bleeding/hematoma4. constipation5. embolism leading to tissue ischemia/infarction6. fluid imbalance7. functional deficit/self-care deficit8. infection leading to sepsis9. pneumonia10. situational response11. skin breakdown12. undernutrition13. voiding dysfunction   Goal: Potential Problems (Fractured Hip)  Signs and symptoms of listed potential problems will be absent or manageable (reference Fractured Hip (Adult) CPG)   Outcome: Ongoing (see interventions/notes)  Pt has full sensation in all extremities; Neurovascular checks intact; lungs clear, HR irregular, BP stable; No c/o NV, HA, SOB or pain at this time. Large bulky dressing to the right knee is clean/dry/intact. Knee immobilizer in place. SCD to the LLE. IV fluids infusing; foley catheter in place, patent and secured. Will monitor.

## 2013-06-02 NOTE — Care Plan (Signed)
Problem: General Plan of Care(Adult,OB)  Goal: Plan of Care Review(Adult,OB)  The patient and/or their representative will communicate an understanding of their plan of care   Outcome: Ongoing (see interventions/notes)  Right femur fracture, 7 lbs bucks traction removed and reapplied, skin dry and intact. Pain a 5/10 medicated with Morphine IV. LFA IV #22 infusing 1/2 NS @ 30 ml/hr. Foley to drainage clear/yellow. Confused to place but able to be reoriented.

## 2013-06-02 NOTE — Nurses Notes (Signed)
Informed Dr. Tomi Likens of pt's BS of 110, no new orders.

## 2013-06-02 NOTE — Progress Notes (Signed)
[facility]  [place], [place] [address]    IP PROGRESS NOTE      Elner, Seifert  Date of Admission:  05/31/2013  Date of Birth:  Oct 27, 1924  Date of Service:  06/02/2013    Chief Complaint: feels ok.  Subjective: denies pain now.  Confused. Not oriented to time.  No fever.    Vital Signs:  Temp (24hrs) Max:36.8 C (98.2 F)      Temperature: 36.8 C (98.2 F)  BP (Non-Invasive): 102/65 mmHg  Heart Rate: 83  Respiratory Rate: 16  Pain Score (Numeric, Faces): 5  SpO2-1: 96 %    Current Medications:    Current Facility-Administered Medications:  1/2 NS premix infusion  Intravenous Continuous   acetaminophen (TYLENOL) tablet 650 mg Oral Q6H PRN   atenolol (TENORMIN) tablet 50 mg Oral Daily   insulin glargine (LANTUS) 100 units/mL injection 15 Units Subcutaneous 2x/day   insulin glulisine (APIDRA) 100 units/mL injection 5 Units Subcutaneous 3x/day AC   morphine 4 mg/mL injection 4 mg Intravenous Q5 Min PRN   morphine 4 mg/mL injection 4 mg Intravenous Q2H PRN   nitroglycerin (NITROSTAT) sublingual tablet 0.4 mg Sublingual Q5 Min PRN   NS flush syringe 10 mL Intravenous Q8HRS   oxyCODONE-acetaminophen (PERCOCET) 5-325mg  per tablet 1 Tab Oral Q4H PRN   travoprost (TRAVATAN) 0.004% ophthalmic solution 1 Drop Both Eyes NIGHTLY       Today's Physical Exam:  General: appears in good health. No distress.   Eyes: Pupils equal and round, reactive to light and accomodation.   HENT:Head atraumatic and normocephalic   Neck: No JVD or thyromegaly or lymphadenopathy   Lungs: Clear to auscultation bilaterally.   Cardiovascular: Iregular rate and rhythm, no murmur,   Abdomen: Soft, non-tender, Bowel sounds normal, No hepatosplenomegaly   Extremities: extremities normal, atraumatic, no cyanosis or edema   Skin: Skin warm and dry   Neurologic: Grossly normal   Lymphatics: No lymphadenopathy   Psychiatric:  Unable to assess.        I/O:  I/O last 24 hours:    Intake/Output Summary (Last 24 hours) at  06/02/13 1011  Last data filed at 06/02/13 0600   Gross per 24 hour   Intake  902.1 ml   Output    600 ml   Net  302.1 ml     I/O current shift:         Labs  Please indicate ordered or reviewed)  Reviewed:   Lab Results for Last 24 Hours:    Results for orders placed during the hospital encounter of 05/31/13 (from the past 24 hour(s))   POCT FINGERSTICK GLUCOSE       Result Value Range    BLD GLUCOSE POCT 125 (*) 60 - 100 mg/dL   UEA5W (HEMOGLOBIN U9W WITH EST AVG GLUCOSE)       Result Value Range    GLYCOHEMOGLOBIN 5.6  4.0 - 6.0 %    ESTIMATED AVERAGE GLUCOSE 114 (*) 70 - 110 mg/dL   PT/INR       Result Value Range    PROTHROMBIN TIME 18.8 (*) 9.8 - 11.0 sec    INR NORMALIZED 1.77     POCT FINGERSTICK GLUCOSE       Result Value Range    BLD GLUCOSE POCT 115 (*) 60 - 100 mg/dL   POCT FINGERSTICK GLUCOSE       Result Value Range    BLD GLUCOSE POCT 124 (*) 60 - 100 mg/dL  PT/INR       Result Value Range    PROTHROMBIN TIME 12.0 (*) 9.8 - 11.0 sec    INR NORMALIZED 1.15     POCT FINGERSTICK GLUCOSE       Result Value Range    BLD GLUCOSE POCT 106 (*) 60 - 100 mg/dL       Radiology Tests (Please indicate ordered or reviewed)  Reviewed: N/A    Problem List:  Active Hospital Problems   (*Primary Problem)    Diagnosis    *Displacement of internal right knee prosthesis    Atrial fibrillation    Type II or unspecified type diabetes mellitus without mention of complication, not stated as uncontrolled    Coumadin toxicity       Assessment/ Plan:   1. Right knee pain: Xray shows hardware with acute fracture. INR 1.1 today. Medically stable for surgical intervention. No active cardiopulmonary disease. Medium risk for intermediate procedure.   2. Atrial fibrillation anticoagulated with coumadin:  Rate controlled with atenolol. Hold coumadin. Restart coumadin tomorrow.   3. DMII: Follow accuchecks. Cont Lantus with pre meal apidra.   4. Glaucoma. Cont travatan eye drops.  Cont current care.  Disposition: rehab placement in  2-3 days.

## 2013-06-02 NOTE — Consults (Signed)
 [facility]  [place], [place] [address]      ANESTHESIA PRE-OP EVALUATION    MRN:  [id number]  Bridget Hall  79 y.o.  Sex: female     Date of Service: 06/02/2013    Surgeon: Clotilde):  Arvell Norleen LABOR, MD    Scheduled Procedure:  Procedure(s):  OPEN REDUCTION INTERNAL FIXATION FRACTURE FEMUR    Diagnosis/Pertinant HPI: R FEMUR FX     Weight: 92.1 kg (203 lb 0.7 oz)  Height: 170.2 cm (5' 7)  Pain Score (Numeric, Faces): 5  Filed Vitals:    06/01/13 2017 06/02/13 0029 06/02/13 0406 06/02/13 0750   BP: 105/62 142/82 104/65 102/65   Pulse: 88 85 97 83   Temp: 36.4 C (97.5 F) 36.4 C (97.5 F) 36.7 C (98.1 F) 36.8 C (98.2 F)   Resp: 16 16 16 16    SpO2: 96% 95% 97% 96%       ALLERGY:  No Known Allergies    Medications Prior to Admission    Outpatient Medications    atenolol  (TENORMIN ) 50 mg Oral Tablet    Take 50 mg by mouth Once a day    glipiZIDE  (GLUCOTROL ) 5 mg Oral Tablet    Take 5 mg by mouth Every morning before breakfast Take 30 minutes before meals    travoprost  (TRAVATAN ) 0.004 % Ophthalmic Drops    Instill 1 Drop into both eyes Every night    warfarin (COUMADIN) 1 mg Oral Tablet    Take 1 mg by mouth Every evening Take 1.5mg  every evening for a total of 5.5mg  every evening    Warfarin (COUMADIN) 4 mg Oral Tablet    Take 4 mg by mouth Every evening    warfarin (COUMADIN) 1 mg Oral Tablet    Take 1 mg by mouth Every evening          Current Facility-Administered Medications:  1/2 NS premix infusion  Intravenous Continuous   acetaminophen  (TYLENOL ) tablet 650 mg Oral Q6H PRN   atenolol  (TENORMIN ) tablet 50 mg Oral Daily   insulin  glargine (LANTUS ) 100 units/mL injection 15 Units Subcutaneous 2x/day   insulin  glulisine (APIDRA ) 100 units/mL injection 5 Units Subcutaneous 3x/day AC   morphine  4 mg/mL injection 4 mg Intravenous Q5 Min PRN   morphine  4 mg/mL injection 4 mg Intravenous Q2H PRN   nitroglycerin  (NITROSTAT ) sublingual tablet 0.4 mg Sublingual Q5 Min PRN   NS  flush syringe 10 mL Intravenous Q8HRS   oxyCODONE -acetaminophen  (PERCOCET) 5-325mg  per tablet 1 Tab Oral Q4H PRN   travoprost  (TRAVATAN ) 0.004% ophthalmic solution 1 Drop Both Eyes NIGHTLY       Past Medical History   Diagnosis Date   . A-fib    . Diabetes mellitus    . HTN (hypertension)    . Arthropathy, unspecified, site unspecified    . Atrial fibrillation      chronic anti coagulant therapy   . Arthritis    . Glaucoma    . Hypertension    . CVA (cerebrovascular accident) Past     no residua deficits   . Hearing loss    . Wears glasses    . Obesity    . Type 2 diabetes mellitus      NIDDM   . Knee fracture, right    . Knee pain, right      S/P ground level fall-dislacement of internal right knee prosthesis       Past Surgical History   Procedure Laterality Date   .  Hx knee replacment Bilateral Past   . Hx hysterectomy     . Hx hip replacement Right Past   . Hx vein stripping Bilateral Past     Legs           Prior Anesthesia Difficulties:  no    Familial Anesthesia Difficulties: no    Social History     Occupational History   . Not on file.     Social History Main Topics   . Smoking status: Never Smoker    . Smokeless tobacco: Never Used   . Alcohol Use: No   . Drug Use: No   . Sexual Activity: Not on file     Other Topics Concern   . Uses Walker Yes       Labs:   BMP:    Lab Results   Component Value Date    SODIUM 144 05/31/2013    POTASSIUM 4.3 05/31/2013    CHLORIDE 108 05/31/2013    CO2 28 05/31/2013    BUN 27* 05/31/2013    CREATININE 0.96 05/31/2013    GLUCOSECJ 124* 05/31/2013    GFR 55* 05/31/2013    CALCIUM 9.4 05/31/2013     CBC:    Lab Results   Component Value Date    WBC 11.9* 05/31/2013    HGB 15.8* 05/31/2013    HCT 49.6* 05/31/2013    PLTCNT 284 05/31/2013      Platelets:    Lab Results   Component Value Date    PLTCNT 284 05/31/2013     Coags:  Lab Results   Component Value Date    PROTHROMTME 12.0* 06/02/2013    INRNORM 1.15 06/02/2013           EKG: Afib rate 98 nsst  Medicine note reviewed  cleared  Exercise Tolerance:greater than 4 mets no    ANESTHESIA DAY OF SURGERY EVALUATION      NPO:took beta blocker.  Clear liquid breakfast  Airway: II (soft palate, uvula, fauces visible)  Dentition: ok    Cardiovascular: regular rate and rhythm    Respiratory: Clear to auscultation bilaterally.        ASA Status: 3  Proposed Anesthesia: SAB GA backup .  Risk heart problems discussed        Pre-Induction Evaluation and informed consent including risks, benefits, and alternatives. Patient was seen and evaluated immediately prior to the induction of anesthesia.

## 2013-06-02 NOTE — Nurses Notes (Signed)
Pt arrived from PACU to room 413B. Pt alert and oriented. Sensation intact. VSS. Family present, call bell in reach. Will assess and monitor.   Milus Height, RN

## 2013-06-02 NOTE — Care Management Notes (Signed)
SS: Attempted contact with patient's son, Michelle Piper. Contact information was left for a return call.

## 2013-06-03 LAB — PERFORM POC FINGERSTICK GLUCOSE
BLD GLUCOSE POCT: 121 mg/dL — ABNORMAL HIGH (ref 60–100)
BLD GLUCOSE POCT: 96 mg/dL (ref 60–100)
BLD GLUCOSE POCT: 104 mg/dL — ABNORMAL HIGH (ref 60–100)

## 2013-06-03 LAB — CBC
BASOPHILS %: 0.2 % (ref 0.0–1.4)
EOSINOPHIL #: 0.05 K/uL (ref 0.00–0.50)
EOSINOPHIL %: 0.6 % (ref 0.0–5.2)
HCT: 33.2 % — AB (ref 36.0–45.0)
HGB: 11.1 g/dL — AB (ref 12.0–15.5)
LYMPHOCYTE #: 1.45 10*3/uL (ref 0.70–3.20)
MCH: 34 pg (ref 28.0–34.0)
MCHC: 33.3 g/dL (ref 33.0–37.0)
MCV: 102 fL — ABNORMAL HIGH (ref 82.0–97.0)
MONOCYTE #: 0.56 K/uL (ref 0.20–0.90)
MONOCYTE %: 5.8 % (ref 4.8–12.0)
MPV: 6.6 fL — ABNORMAL LOW (ref 7.0–9.4)
PLATELET COUNT: 224 K/uL (ref 150–400)
PMN #: 7.56 K/uL — ABNORMAL HIGH (ref 1.50–6.50)
RBC: 3.26 M/uL — ABNORMAL LOW (ref 4.00–5.10)
RDW: 12.3 % (ref 11.0–13.0)
WBC: 9.6 10*3/uL (ref 4.0–11.0)

## 2013-06-03 LAB — PT/INR
INR NORMALIZED: 1.05
PROTHROMBIN TIME: 10.9 s (ref 9.8–11.0)

## 2013-06-03 MED ORDER — WARFARIN 5 MG TABLET
5.00 mg | ORAL_TABLET | Freq: Every evening | ORAL | Status: DC
Start: 2013-06-03 — End: 2013-06-07
  Administered 2013-06-03 – 2013-06-06 (×4): 5 mg via ORAL
  Filled 2013-06-03 (×4): qty 1

## 2013-06-03 MED ORDER — ENOXAPARIN 40 MG/0.4 ML SUB-Q SYRINGE - EAST
40.0000 mg | INJECTION | Freq: Every day | SUBCUTANEOUS | Status: DC
Start: 2013-06-03 — End: 2013-06-09
  Administered 2013-06-03 – 2013-06-09 (×7): 40 mg via SUBCUTANEOUS
  Filled 2013-06-03 (×7): qty 0.4

## 2013-06-03 MED ORDER — CYCLOBENZAPRINE 10 MG TABLET
10.00 mg | ORAL_TABLET | Freq: Three times a day (TID) | ORAL | Status: DC | PRN
Start: 2013-06-03 — End: 2013-06-09
  Filled 2013-06-03: qty 1

## 2013-06-03 NOTE — Progress Notes (Signed)
Afebrile. Moderate pain when moved. Right foot NV intact.  Hct 33.2. PT  10.9. Will start on Lovenox until PT is in therapeutic range.  Will begin rehab tomorrow when pain is a little better and the therapists are back.

## 2013-06-03 NOTE — Care Plan (Signed)
Problem: General Plan of Care(Adult,OB)  Goal: Plan of Care Review(Adult,OB)  The patient and/or their representative will communicate an understanding of their plan of care   Outcome: Ongoing (see interventions/notes)  Pt seemed to rest comfortably for majority of shift. Pt reports pain level of 0/10. Pt on bedrest, knee immobilizer in place. PT/OT has not evaluated yet to provide nursing with recommendations. Pt is alert and oriented to person and place but is disoriented to time and situation and was having some confusion as to why she was in the hospital. Neurovascularly intact. Family at bedside at this time. Call bell within reach. Will continue to monitor for remainder of shift.   Harland Dingwall, RN        Problem: Fall/Trauma/Injury Risk (Adult, Obstetrics)  Goal: Absence of Trauma/Injury/Falls  Patient will demonstrate the desired outcomes.   Outcome: Ongoing (see interventions/notes)  Fall risk protocol in place at this time. Pt aware to call for assistance when needing to ambulate.        Problem: Fractured Hip (Adult)  Prevent and manage potential problems including:1. acute cognitive dysfunction leading to delirium/acute confusion2. acute pain3. bleeding/hematoma4. constipation5. embolism leading to tissue ischemia/infarction6. fluid imbalance7. functional deficit/self-care deficit8. infection leading to sepsis9. pneumonia10. situational response11. skin breakdown12. undernutrition13. voiding dysfunction   Goal: Potential Problems (Fractured Hip)  Signs and symptoms of listed potential problems will be absent or manageable (reference Fractured Hip (Adult) CPG)   Outcome: Ongoing (see interventions/notes)  Pt reports pain level of 0 today. Pt on bedrest. Right lower extremity neurovascularly intact. Ice pack in place. Knee immobilizer in place with ACE bandage, 4x4 and ABD dressing.    Problem: Pressure Ulcer Risk (Using Braden Scale) (Adult, Obstetrics, Pediatric)  Goal: Skin Integrity  Patient will  demonstrate the desired outcomes.   Outcome: Ongoing (see interventions/notes)  No evidence of skin breakdown upon assessment.

## 2013-06-03 NOTE — Progress Notes (Signed)
[facility]  [place], [place] [address]    IP PROGRESS NOTE      Bridget Hall, Bridget Hall  Date of Admission:  05/31/2013  Date of Birth:  Oct 29, 1924  Date of Service:  06/03/2013    Chief Complaint: feels ok.  Subjective: denies pain now.  No confusion today.  No fever.    Vital Signs:  Temp (24hrs) Max:37 C (98.6 F)      Temperature: 36.9 C (98.4 F)  BP (Non-Invasive): 90/43 mmHg  MAP (Non-Invasive): 71 mmHG  Heart Rate: 97  Respiratory Rate: 16  Pain Score (Numeric, Faces): 0  SpO2-1: 96 %    Current Medications:    Current Facility-Administered Medications:  1/2 NS premix infusion  Intravenous Continuous   acetaminophen (TYLENOL) tablet 650 mg Oral Q6H PRN   atenolol (TENORMIN) tablet 50 mg Oral Daily   insulin glargine (LANTUS) 100 units/mL injection 15 Units Subcutaneous 2x/day   insulin glulisine (APIDRA) 100 units/mL injection 5 Units Subcutaneous 3x/day AC   morphine 4 mg/mL injection 4 mg Intravenous Q5 Min PRN   morphine 4 mg/mL injection 4 mg Intravenous Q2H PRN   nitroglycerin (NITROSTAT) sublingual tablet 0.4 mg Sublingual Q5 Min PRN   NS flush syringe 10 mL Intravenous Q8HRS   ondansetron (ZOFRAN) 2 mg/mL injection 4 mg Intravenous Q6H PRN   oxyCODONE-acetaminophen (PERCOCET) 5-325mg  per tablet 1 Tab Oral Q4H PRN   travoprost (TRAVATAN) 0.004% ophthalmic solution 1 Drop Both Eyes NIGHTLY   warfarin (COUMADIN) tablet 5 mg Oral NIGHTLY       Today's Physical Exam:  General: appears in good health. No distress.   Eyes: Pupils equal and round, reactive to light and accomodation.   HENT:Head atraumatic and normocephalic   Neck: No JVD or thyromegaly or lymphadenopathy   Lungs: Clear to auscultation bilaterally.   Cardiovascular: Iregular rate and rhythm, no murmur,   Abdomen: Soft, non-tender, Bowel sounds normal, No hepatosplenomegaly   Extremities:  no cyanosis or edema. Lt knee dressing+   Skin: Skin warm and dry   Neurologic: Grossly normal   Lymphatics: No lymphadenopathy      Psychiatric:  Mood and affect normal.        I/O:  I/O last 24 hours:      Intake/Output Summary (Last 24 hours) at 06/03/13 1342  Last data filed at 06/03/13 1300   Gross per 24 hour   Intake 4138.4 ml   Output   2075 ml   Net 2063.4 ml     I/O current shift:         Labs  Please indicate ordered or reviewed)  Reviewed:   Lab Results for Last 24 Hours:    Results for orders placed during the hospital encounter of 05/31/13 (from the past 24 hour(s))   POCT FINGERSTICK GLUCOSE       Result Value Range    BLD GLUCOSE POCT 113 (*) 60 - 100 mg/dL   PT/INR       Result Value Range    PROTHROMBIN TIME 10.9  9.8 - 11.0 sec    INR NORMALIZED 1.05     CBC       Result Value Range    WBC 9.6  4.0 - 11.0 K/uL    RBC 3.26 (*) 4.00 - 5.10 M/uL    HGB 11.1 (*) 12.0 - 15.5 g/dL    HCT 60.4 (*) 54.0 - 45.0 %    MCV 102.0 (*) 82.0 - 97.0 fL    MCH  34.0  28.0 - 34.0 pg    MCHC 33.3  33.0 - 37.0 g/dL    RDW 16.1  09.6 - 04.5 %    PLATELET COUNT 224 (*) 150 - 400 K/uL    MPV 6.6 (*) 7.0 - 9.4 fL    PMN % 78.5 (*) 43.0 - 76.0 %    LYMPHOCYTE % 15.0  15.0 - 43.0 %    MONOCYTE % 5.8  4.8 - 12.0 %    EOSINOPHIL % 0.6  0.0 - 5.2 %    BASOPHILS % 0.2  0.0 - 1.4 %    PMN # 7.56 (*) 1.50 - 6.50 K/uL    LYMPHOCYTE # 1.45  0.70 - 3.20 K/uL    MONOCYTE # 0.56  0.20 - 0.90 K/uL    EOSINOPHIL # 0.05  0.00 - 0.50 K/uL    BASOPHIL # 0.02  0.00 - 0.10 K/uL   POCT FINGERSTICK GLUCOSE       Result Value Range    BLD GLUCOSE POCT 121 (*) 60 - 100 mg/dL   POCT FINGERSTICK GLUCOSE       Result Value Range    BLD GLUCOSE POCT 68  60 - 100 mg/dL   POCT FINGERSTICK GLUCOSE       Result Value Range    BLD GLUCOSE POCT 96  60 - 100 mg/dL       Radiology Tests (Please indicate ordered or reviewed)  Reviewed: N/A    Problem List:  Active Hospital Problems   (*Primary Problem)    Diagnosis    *Displacement of internal right knee prosthesis    Periprosthetic fracture around internal prosthetic right hip joint    Atrial fibrillation    Type II or unspecified  type diabetes mellitus without mention of complication, not stated as uncontrolled    Coumadin toxicity       Assessment/ Plan:   1. Right knee pain: S/p ORIF fracture femur 11/26 by Dr. Margaretha Sheffield.   2. Atrial fibrillation.  Coumadin restarted. Rate controlled with atenolol.   3. DMII: controlled with Lantus with pre meal apidra.   4. Glaucoma. Cont travatan eye drops.  Cont current care.  Disposition: rehab placement in 2-3 days.

## 2013-06-03 NOTE — Care Plan (Signed)
 Problem: General Plan of Care(Adult,OB)  Goal: Plan of Care Review(Adult,OB)  The patient and/or their representative will communicate an understanding of their plan of care   Outcome: Ongoing (see interventions/notes)  S/p right femur ORIF 4x4, zeroform, ABDs, ace wrap dressing with knee immobilizer. Neurovascularly intact.  Medicated for pain of a 5/10 on the numeric scale with Morphine  IV. Orientated to person.

## 2013-06-04 LAB — CBC
BASOPHIL #: 0.02 10*3/uL (ref 0.00–0.10)
BASOPHILS %: 0.2 % (ref 0.0–1.4)
EOSINOPHIL #: 0.11 K/uL (ref 0.00–0.50)
EOSINOPHIL %: 1.3 % (ref 0.0–5.2)
HCT: 30 % — ABNORMAL LOW (ref 36.0–45.0)
HGB: 10.2 g/dL — ABNORMAL LOW (ref 12.0–15.5)
LYMPHOCYTE #: 1.37 K/uL (ref 0.70–3.20)
LYMPHOCYTE %: 16.3 % (ref 15.0–43.0)
MCH: 34.6 pg — ABNORMAL HIGH (ref 28.0–34.0)
MCHC: 34 g/dL (ref 33.0–37.0)
MCV: 102 fL — ABNORMAL HIGH (ref 82.0–97.0)
MONOCYTE #: 0.64 K/uL (ref 0.20–0.90)
MONOCYTE %: 7.6 % (ref 4.8–12.0)
MPV: 7 fL (ref 7.0–9.4)
PLATELET COUNT: 212 10*3/uL (ref 150–400)
PMN #: 6.27 K/uL (ref 1.50–6.50)
PMN %: 74.6 % (ref 43.0–76.0)
RBC: 2.95 M/uL — ABNORMAL LOW (ref 4.00–5.10)
RDW: 12.1 % (ref 11.0–13.0)
WBC: 8.4 K/uL (ref 4.0–11.0)

## 2013-06-04 LAB — BASIC METABOLIC PROFILE - BMC/JMC ONLY
BUN: 14 mg/dL — AB (ref 6–22)
CALCIUM: 8.5 mg/dL (ref 8.5–10.5)
CARBON DIOXIDE: 29 mmol/L (ref 22–32)
CHLORIDE: 101 mmol/L (ref 101–111)
CREATININE: 0.71 mg/dL (ref 0.53–1.00)
ESTIMATED GLOMERULAR FILTRATION RATE: >60 mL/min (ref >60)
GLUCOSE: 93 mg/dL (ref 70–110)
POTASSIUM: 4 mmol/L (ref 3.5–5.0)
SODIUM: 135 mmol/L — ABNORMAL LOW (ref 136–145)

## 2013-06-04 LAB — PT/INR
INR NORMALIZED: 1.06
PROTHROMBIN TIME: 11.1 s — ABNORMAL HIGH (ref 9.8–11.0)

## 2013-06-04 LAB — PERFORM POC FINGERSTICK GLUCOSE
BLD GLUCOSE POCT: 104 mg/dL — ABNORMAL HIGH (ref 60–100)
BLD GLUCOSE POCT: 125 mg/dL — ABNORMAL HIGH (ref 60–100)

## 2013-06-04 MED ORDER — POLYSACCHARIDE IRON COMPLEX 150 MG IRON CAPSULE
150.0000 mg | ORAL_CAPSULE | Freq: Every day | ORAL | Status: DC
  Administered 2013-06-04 – 2013-06-09 (×6): 150 mg via ORAL
  Filled 2013-06-04 (×7): qty 1

## 2013-06-04 NOTE — Progress Notes (Signed)
[facility]  [place], [place] [address]    IP PROGRESS NOTE      Bridget Hall, Bridget Hall  Date of Admission:  05/31/2013  Date of Birth:  September 22, 1924  Date of Service:  06/04/2013    Chief Complaint: feels fine.  Subjective:  No knee pain at rest.  No new complaints.      Vital Signs:  Temp (24hrs) Max:36.9 C (98.4 F)      Temperature: 36.5 C (97.7 F)  BP (Non-Invasive): 104/54 mmHg  MAP (Non-Invasive): 71 mmHG  Heart Rate: 95  Respiratory Rate: 16  Pain Score (Numeric, Faces): 0  SpO2-1: 95 %    Current Medications:    Current Facility-Administered Medications:  1/2 NS premix infusion  Intravenous Continuous   acetaminophen (TYLENOL) tablet 650 mg Oral Q6H PRN   atenolol (TENORMIN) tablet 50 mg Oral Daily   cyclobenzaprine (FLEXERIL) tablet 10 mg Oral 3x/day PRN   enoxaparin (LOVENOX) 40 mg/0.4 mL SubQ injection 40 mg Subcutaneous Daily   insulin glargine (LANTUS) 100 units/mL injection 15 Units Subcutaneous 2x/day   insulin glulisine (APIDRA) 100 units/mL injection 5 Units Subcutaneous 3x/day AC   morphine 4 mg/mL injection 4 mg Intravenous Q5 Min PRN   morphine 4 mg/mL injection 4 mg Intravenous Q2H PRN   nitroglycerin (NITROSTAT) sublingual tablet 0.4 mg Sublingual Q5 Min PRN   NS flush syringe 10 mL Intravenous Q8HRS   ondansetron (ZOFRAN) 2 mg/mL injection 4 mg Intravenous Q6H PRN   oxyCODONE-acetaminophen (PERCOCET) 5-325mg  per tablet 1 Tab Oral Q4H PRN   travoprost (TRAVATAN) 0.004% ophthalmic solution 1 Drop Both Eyes NIGHTLY   warfarin (COUMADIN) tablet 5 mg Oral NIGHTLY       Today's Physical Exam:  General: appears in good health. No distress.   Eyes: Pupils equal and round, reactive to light and accomodation.   HENT:Head atraumatic and normocephalic   Neck: No JVD or thyromegaly or lymphadenopathy   Lungs: Clear to auscultation bilaterally.   Cardiovascular: Iregular rate and rhythm, no murmur,   Abdomen: Soft, non-tender, Bowel sounds normal, No hepatosplenomegaly      Extremities:  no cyanosis or edema. Lt knee dressing+   Skin: Skin warm and dry   Neurologic: Grossly normal   Lymphatics: No lymphadenopathy   Psychiatric:  Mood and affect normal.        I/O:  I/O last 24 hours:      Intake/Output Summary (Last 24 hours) at 06/04/13 1232  Last data filed at 06/04/13 1104   Gross per 24 hour   Intake  537.3 ml   Output   2050 ml   Net -1512.7 ml     I/O current shift:  11/28 0800 - 11/28 1559  In: 0   Out: 1400 [Urine:1400]      Labs  Please indicate ordered or reviewed)  Reviewed:   Lab Results for Last 24 Hours:    Results for orders placed during the hospital encounter of 05/31/13 (from the past 24 hour(s))   POCT FINGERSTICK GLUCOSE       Result Value Range    BLD GLUCOSE POCT 100  60 - 100 mg/dL   POCT FINGERSTICK GLUCOSE       Result Value Range    BLD GLUCOSE POCT 104 (*) 60 - 100 mg/dL   PT/INR       Result Value Range    PROTHROMBIN TIME 11.1 (*) 9.8 - 11.0 sec    INR NORMALIZED 1.06     CBC  Result Value Range    WBC 8.4  4.0 - 11.0 K/uL    RBC 2.95 (*) 4.00 - 5.10 M/uL    HGB 10.2 (*) 12.0 - 15.5 g/dL    HCT 16.1 (*) 09.6 - 45.0 %    MCV 102.0 (*) 82.0 - 97.0 fL    MCH 34.6 (*) 28.0 - 34.0 pg    MCHC 34.0  33.0 - 37.0 g/dL    RDW 04.5  40.9 - 81.1 %    PLATELET COUNT 212  150 - 400 K/uL    MPV 7.0  7.0 - 9.4 fL    PMN % 74.6  43.0 - 76.0 %    LYMPHOCYTE % 16.3  15.0 - 43.0 %    MONOCYTE % 7.6  4.8 - 12.0 %    EOSINOPHIL % 1.3  0.0 - 5.2 %    BASOPHILS % 0.2  0.0 - 1.4 %    PMN # 6.27  1.50 - 6.50 K/uL    LYMPHOCYTE # 1.37  0.70 - 3.20 K/uL    MONOCYTE # 0.64  0.20 - 0.90 K/uL    EOSINOPHIL # 0.11  0.00 - 0.50 K/uL    BASOPHIL # 0.02  0.00 - 0.10 K/uL   BASIC METABOLIC PROFILE - [facility] ONLY       Result Value Range    GLUCOSE 93  70 - 110 mg/dL    BUN 14 (*) 6 - 22 mg/dL    CREATININE 9.14  7.82 - 1.00 mg/dL    ESTIMATED GLOMERULAR FILTRATION RATE >60  >60 ml/min    SODIUM 135 (*) 136 - 145 mmol/L    POTASSIUM 4.0  3.5 - 5.0 mmol/L    CHLORIDE 101  101 - 111  mmol/L    CARBON DIOXIDE 29  22 - 32 mmol/L    CALCIUM 8.5  8.5 - 10.5 mg/dL   POCT FINGERSTICK GLUCOSE       Result Value Range    BLD GLUCOSE POCT 97  60 - 100 mg/dL   POCT FINGERSTICK GLUCOSE       Result Value Range    BLD GLUCOSE POCT 104 (*) 60 - 100 mg/dL       Radiology Tests (Please indicate ordered or reviewed)  Reviewed: N/A    Problem List:  Active Hospital Problems   (*Primary Problem)    Diagnosis    *Displacement of internal right knee prosthesis    Periprosthetic fracture around internal prosthetic right hip joint    Atrial fibrillation    Type II or unspecified type diabetes mellitus without mention of complication, not stated as uncontrolled    Coumadin toxicity       Assessment/ Plan:   1. Right knee pain: S/p ORIF fracture femur 11/26 by Dr. Margaretha Sheffield.   2. Atrial fibrillation.  Cont coumadin. Rate controlled with atenolol.   3. DMII: controlled with Lantus with pre meal apidra.   4. Glaucoma. Cont travatan eye drops.  5. Post op acute blood loss anemia. Expected. Start iron.  Cont current care.  Disposition: rehab placement in 2-3 days.

## 2013-06-04 NOTE — Care Management Notes (Signed)
SS: Myself and Tabitha, LSW met with patient and her son Michelle Piper. Michelle Piper prefers to go by [facility] and will be referenced to as [employer] throughout notes. Mick provided a list of 5 facilities in [place] that the family would like to have SNF referrals sent to. Mick related that his sister in law, Yamili Lichtenwalner, has family members that have a conversion [employer] Niece that has a seat that turns into a bed and that the family will be transporting the patient to [facility]. Patient and Fara Olden both understand that a projected release date of Monday is expected and that they need to make appropriate arrangements to have patient picked up that day. Mick related he understood and would be in contact with his brother and sister in law this evening. Mick provided a contact number of [phone] for himself and a number of [phone] for his sister in law Deb.

## 2013-06-04 NOTE — Care Plan (Signed)
Problem: General Plan of Care(Adult,OB)  Goal: Plan of Care Review(Adult,OB)  The patient and/or their representative will communicate an understanding of their plan of care   Outcome: Ongoing (see interventions/notes)  Plan of Care discussed with patient and patients son, Discussed medications, diet, activity level and use of call light for assistance. Rails up x2, call light in reach, see previous shift assessment and notes.

## 2013-06-04 NOTE — PT Treatment (Signed)
 [facility]  [place], [place] [address]    Physical Therapy Progress Note      Subjective:  Patient complains of pain in the operative leg this afternoon.  Willing to get up with us .  Very fearful of falling .  Did not remember being up in the morning.   Numeric pain scale:  8out of 10      Areas of functional mobility addressed:25 minutes        Supine - Sit:  Moderate assist, 1 person assist and 2 person assist     Sit - Supine:  Maximum assist and 2 person assist     Sit - stand:  Maximum assist and 2 person         Balance while sitting:  Unsteady - no physical help     Balance while standing: Partial physical support     Comments:  Patient required mod assist of 1-2 to get to the side of the bed.  Stood with the FWW and max assist of 2.  Patient was able to bear some weight ton the operative leg but reported that it was very painful.  Patient was able to take a step forwards and backwards with max assist of 2 and the FWW.  Patient reported great pain with this.  Again she is fearful of falling.  Required max assist of 2 to return to supine.      Bed alarm reset:  Yes    Assessment:  Patient continues to require mod to max assist of 2 for all activities.  Patient is fearful of falling as well in a good bit of pain.  She will benefit from continued PT.  Recommend a SNF stay post DC    Plan:  Continue PT per plan of care    PT Recommendations for Nursing:  No new reocommendations  PT Recommendations for Patient/Family::  No new recommendations      Nena Polka, PT

## 2013-06-04 NOTE — Care Management Notes (Signed)
SS: Per Mick's request, I attempted contact with Bridget Hall at [phone]. 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 I related that the patient is interested in placement in [place] following her discharge from [facility]. I provided contact information for a return call. I related that referrals have been sent to several facilities in [place] at this time.

## 2013-06-04 NOTE — Care Management Notes (Signed)
SS: [facility]idget [facility] (unable to accept patient).

## 2013-06-04 NOTE — Care Management Notes (Signed)
SS: [facility] (unable to accept patient).

## 2013-06-04 NOTE — Care Plan (Signed)
 Problem: General Plan of Care(Adult,OB)  Goal: Plan of Care Review(Adult,OB)  The patient and/or their representative will communicate an understanding of their plan of care   Outcome: Ongoing (see interventions/notes)  S/p ORIF of right femur displaced knee replacement. Ace wrap dry and intact with knee immobilizer. Neurovascularly intact. SCD on left leg.

## 2013-06-04 NOTE — Progress Notes (Signed)
Afebrile. In good spirits. Much more comfortable. Able to stand with PT.  Hct 30. Will remove splint tomorrow and try to begin some knee motion. Discharge plans discussed with family.

## 2013-06-04 NOTE — Care Plan (Signed)
Problem: General Plan of Care(Adult,OB)  Goal: Plan of Care Review(Adult,OB)  The patient and/or their representative will communicate an understanding of their plan of care   Outcome: Ongoing (see interventions/notes)  Assessment   Assessment: Patient presents with impaired mobility. Patient required max assist of 1-2 for bed mobility and transfers were with max assist of 2. She was not able to take any steps this date. Dizziness was not an issue. Very fatigue post gait. Recommend continued PT. Also recommend a SNF stay post DC   Rehab potential: good   Goals:   Learning goals: Exercise, Gait and Transfers/mobility   Physical therapy SHORT TERM goals:   1. Supine <> sit mod assist of 1   2. Sit to stand mod assist of 1   3. Bed to chair mod assist of 1   4. Gait atleast 50 feet with mod assist of 1 and FWW   The above is to be completed by 06/12/13   Physical therapy LONG TERM goals:   1. Supine <> sit min assist of 1   2. Sit to stand min assist of 1   3. Bed to chair min assist of 1   4. Gait atleast 100 feet with min assist of 1 and FWW   The above is to be completed by 06/19/13   Patient and/or family Goals/Expectations: Patient wants to go to a nursing home for rehab closer to [place]:   Plan:   Patient to be seen: PT 1-2 times per day   PT to see patient ZOX:WRUE training, Transfer training, Therapeutic exercises and Assistive devices training   PT Recommendations for Nursing: Encourage patient to pump her ankles   PT Recommendations for Patient/Family:: instructed patient to perform ankle pumps.  Hedy Jacob, PT

## 2013-06-04 NOTE — Care Management Notes (Signed)
SS: SNF referral was sent to [facility] ([place], [place]), [facility] ([place], [place]), [facility] ([place], [place]), [facility] ([place], [place]), and [facility] [place], [place]) per patient and son, Mick's request.

## 2013-06-04 NOTE — PT Evaluation (Signed)
 [facility]  [place], [place] [address]  Rehabilitation Services  Physical Therapy Initial Evaluation        Patient Name: Bridget Hall  Date of Birth: 1925/03/21  Height: 170.2 cm (5' 7)  Weight: 92.1 kg (203 lb 0.7 oz)  Room/Bed: 413/B  Payor: MEDICARE / Plan: MEDICARE PART A AND B / Product Type: Medicare /     Date/Time of Admission: 05/31/2013 10:26 PM  Admitting Diagnosis:  R FEMUR FX    Bridget Hall is a 79 y.o., female, admitted to the hospital on 05/31/13 with right femur fracture.  Underwent ORIF of the fracture on 06/02/13    Past Medical Hx:    Past Medical History   Diagnosis Date   . A-fib    . Diabetes mellitus    . HTN (hypertension)    . Arthropathy, unspecified, site unspecified    . Atrial fibrillation      chronic anti coagulant therapy   . Arthritis    . Glaucoma    . Hypertension    . CVA (cerebrovascular accident) Past     no residua deficits   . Hearing loss    . Wears glasses    . Obesity    . Type 2 diabetes mellitus      NIDDM   . Knee fracture, right    . Knee pain, right      S/P ground level fall-dislacement of internal right knee prosthesis     Past Surgical Hx:    Past Surgical History   Procedure Laterality Date   . Hx knee replacment Bilateral Past   . Hx hysterectomy     . Hx hip replacement Right Past   . Hx vein stripping Bilateral Past     Legs     Past Social Hx:       History     Social History Narrative   . No narrative on file         Patient Information     Precautions:  None noted      Barriers to learning:  None        Weight bearing status;      Location:  Right lower extremity      Weight bearing status:  Weight bearing as tolerated      Prior level of function     Usual living arrangement:lives with her son     Type of dwellling:  One story home     Physical barriers in the home environment:  2-4 stairs     Previously independent with:  Mobility/ambulation       Assistive devices used at home:  rollator     Comments:  Patient  lives with her son in a 1 story home with 2 steps to enter.  Patient uses a rollator for gait activities.  She has assistance as necessary for her ADL  Subjective:     Numeric pain scale:  6out of 10     Comments:  Patient agreeable to work with us .   Complains of pain in the operative leg/  She did not report any dizziness with being upright.  Did report fatigue post gait activities.    Objective:     Cognition:  Person, Place, Time and Situation     Communication ability:  good     Range of Motion     RUE ROM WNL?  yes     LUE ROM WNL?  yes  RLE ROM WNL?  Knee immobilizer in place--good ankle motion     Strength     LLE ROM WNL?  yes     RUE strength WNL?  Grossly 4 out of 5     LUE strength WNL?grossly 4 out of 5     RLE strength WNL?  Quads grossly 3- out of 5--good ankle strength     LLE strength WNL?  Grossly 4 out of 5          Sensation:  Intact      Functional Ability       Supine - Sit:  Maximum assist and 1 person assist     Sit - Supine:  Maximum assist and 2 person assist     Sitting balance:  Good      Sit - stand:  Maximum assist and 2 person     Stand balance:  Fair         Comments:  Patient required max assist of 1 to get to the side of the bed.  Stood with max assist of 2 and the FWW.  Patient was able to bear only minimal weight on the operative leg.  She was able to stand x 2.  Patient did not take any steps at this time.  Returned to bed with max assist of 2.    Assessment     Assessment:  Patient presents with impaired mobility.  Patient required max assist of 1-2 for bed mobility and transfers were with max assist of 2.  She was not able to take any steps this date.  Dizziness was not an issue.  Very fatigue post gait.  Recommend continued PT.  Also recommend a SNF stay post DC       Rehab potential:  good    Goals:     Learning goals:  Exercise, Gait and Transfers/mobility     Physical therapy SHORT TERM goals:    1. Supine <> sit mod assist of 1  2. Sit to stand mod assist of 1  3. Bed  to chair mod assist of 1  4. Gait atleast 50 feet with mod assist of 1 and FWW  The above is to be completed by 06/12/13     Physical therapy LONG TERM goals:    1. Supine <> sit min assist of 1  2. Sit to stand min assist of 1  3. Bed to chair min assist of 1  4. Gait atleast 100 feet with min assist of 1 and FWW  The above is to be completed by 06/19/13  Patient and/or family Goals/Expectations:  Patient wants to go to a nursing home for rehab closer to [place]:     Plan:       Patient to be seen: PT 1-2 times per day     PT to see patient qnm:Hjpu training, Transfer training, Therapeutic exercises and Assistive devices training    PT Recommendations for Nursing:  Encourage patient to pump her ankles  PT Recommendations for Patient/Family::  instructed patient to perform ankle pumps.    Additional information:     Anticipated rehab needs at discharge:  Skilled nursing facility     Patient has been advised of PT diagnosis, goals and plan:  Yes     Patient/family has given verbal consent for eval, treatment: and plan of care Yes    Is the patient appropriate for skilled PT at this time?  yes    Bridget Hall, PT

## 2013-06-05 LAB — CBC
BASOPHIL #: 0.01 10*3/uL (ref 0.00–0.10)
EOSINOPHIL #: 0.15 K/uL (ref 0.00–0.50)
HCT: 29 % — ABNORMAL LOW (ref 36.0–45.0)
HGB: 10.2 g/dL — ABNORMAL LOW (ref 12.0–15.5)
LYMPHOCYTE #: 1.39 K/uL (ref 0.70–3.20)
MCH: 35.9 pg — ABNORMAL HIGH (ref 28.0–34.0)
MCHC: 35.4 g/dL (ref 33.0–37.0)
MCV: 102 fL — ABNORMAL HIGH (ref 82.0–97.0)
MONOCYTE #: 0.56 K/uL (ref 0.20–0.90)
MPV: 6.6 fL — ABNORMAL LOW (ref 7.0–9.4)
PLATELET COUNT: 228 10*3/uL (ref 150–400)
RBC: 2.85 M/uL — ABNORMAL LOW (ref 4.00–5.10)
RDW: 12.1 % (ref 11.0–13.0)
WBC: 7.5 10*3/uL (ref 4.0–11.0)

## 2013-06-05 LAB — PT/INR
INR NORMALIZED: 1.03
PROTHROMBIN TIME: 10.7 s (ref 9.8–11.0)

## 2013-06-05 LAB — PERFORM POC FINGERSTICK GLUCOSE: BLD GLUCOSE POCT: 160 mg/dL — ABNORMAL HIGH (ref 60–100)

## 2013-06-05 MED ORDER — PNEUMOCOCCAL 23 POLYVALENT VACCINE 25 MCG/0.5 ML INJECTION SOLUTION
0.5000 mL | Freq: Once | INTRAMUSCULAR | Status: AC
Start: 2013-06-06 — End: 2013-06-06
  Administered 2013-06-06: 0.5 mL via INTRAMUSCULAR
  Filled 2013-06-05: qty 0.5

## 2013-06-05 MED ORDER — DOCUSATE SODIUM 100 MG CAPSULE
100.00 mg | ORAL_CAPSULE | Freq: Two times a day (BID) | ORAL | Status: DC
Start: 2013-06-05 — End: 2013-06-09
  Administered 2013-06-05 – 2013-06-08 (×7): 100 mg via ORAL
  Administered 2013-06-08: 0 mg via ORAL
  Administered 2013-06-09: 100 mg via ORAL
  Filled 2013-06-05 (×9): qty 1

## 2013-06-05 MED ORDER — BISACODYL 5 MG TABLET,DELAYED RELEASE
10.00 mg | DELAYED_RELEASE_TABLET | Freq: Every evening | ORAL | Status: DC | PRN
Start: 2013-06-05 — End: 2013-06-09
  Administered 2013-06-05: 10 mg via ORAL
  Filled 2013-06-05 (×2): qty 2

## 2013-06-05 MED ORDER — BISACODYL 10 MG RECTAL SUPPOSITORY
10.00 mg | Freq: Once | RECTAL | Status: AC | PRN
Start: 2013-06-05 — End: 2013-06-06
  Administered 2013-06-06: 10 mg via RECTAL
  Filled 2013-06-05: qty 1

## 2013-06-05 NOTE — PT Treatment (Signed)
[facility]  [place], [place] [address]    Physical Therapy Progress Note      Subjective: Pt. agreeable to return back to bed. New orders received from Dr. Margaretha Sheffield to initiate CPM at 0-30 degrees flexion.        Areas of functional mobility addressed: 15 minutes     Sit - Supine:  Maximum assist, 2 person assist, Requires assist with trunk and Requires assist with lower extremities     Sit - stand:  Maximum assist and 2 person     Bed - Chair/ Chair - bed:  Maximum assist and 2 person     Balance while sitting:  Able to maintain position     Balance while standing: Full physical support     Comments:  Knee immobilizer in place RLE. Pt. sitting up in chair when PT arrived. Sit to stand performed at rw with Max A of 2. Pt. able to take 2-3 short and very labored steps back to bed with Max A of 2 with WBAT RLE. Pt. returned back to supine with Max A of 2. Knee immobilizer removed for CPM, which was placed on pt. at 0-30 degrees, per MD orders. Good tolerance noted.       Assessment: Max A of 2 required for all mobility attempts. CPM set at 0-30 degrees flexion tolerated well. Pt. will continue to be progressed as tolerated.     Plan:  Continue PT per plan of care    PT Recommendations for Nursing: Assist of 2 required for transfers and gait using rw   PT Recommendations for Patient/Family: Pt. to request assist from nursing staff when OOB activities are attempted.     Annabell Sabal, PT

## 2013-06-05 NOTE — Progress Notes (Signed)
 [facility]  [place], [place] [address]    IP PROGRESS NOTE      Shericka, Johnstone  Date of Admission:  05/31/2013  Date of Birth:  Oct 23, 1924  Date of Service:  06/05/2013    Chief Complaint: Patient was admitted for Peri-prosthetic femoral fracture of right knee S/P Surgery.      Subjective:  Patient reports feeling better today. Denies any CP or SOB. She also reports that her pain is better controlled S/P surgery. Her son is at bedside.     Vital Signs:  Temp (24hrs) Max:36.9 C (98.4 F)      Temperature: 36.6 C (97.9 F)  BP (Non-Invasive): 125/69 mmHg  MAP (Non-Invasive): 71 mmHG  Heart Rate: 79  Respiratory Rate: 18  Pain Score (Numeric, Faces): 0  SpO2-1: 95 %    Current Medications:    Current Facility-Administered Medications:  acetaminophen  (TYLENOL ) tablet 650 mg Oral Q6H PRN   atenolol  (TENORMIN ) tablet 50 mg Oral Daily   bisacodyl  (DULCOLAX) enteric coated tablet 10 mg Oral HS PRN   bisacodyl  (DULCOLAX) rectal suppository 10 mg Rectal Once PRN   cyclobenzaprine  (FLEXERIL ) tablet 10 mg Oral 3x/day PRN   docusate sodium  (COLACE) capsule 100 mg Oral 2x/day   enoxaparin  (LOVENOX ) 40 mg/0.4 mL SubQ injection 40 mg Subcutaneous Daily   insulin  glargine (LANTUS ) 100 units/mL injection 15 Units Subcutaneous 2x/day   insulin  glulisine (APIDRA ) 100 units/mL injection 5 Units Subcutaneous 3x/day AC   morphine  4 mg/mL injection 4 mg Intravenous Q5 Min PRN   morphine  4 mg/mL injection 4 mg Intravenous Q2H PRN   nitroglycerin  (NITROSTAT ) sublingual tablet 0.4 mg Sublingual Q5 Min PRN   NS flush syringe 10 mL Intravenous Q8HRS   ondansetron  (ZOFRAN ) 2 mg/mL injection 4 mg Intravenous Q6H PRN   oxyCODONE -acetaminophen  (PERCOCET) 5-325mg  per tablet 1 Tab Oral Q4H PRN   polysaccharide iron  complex capsule 150 mg Oral Daily   travoprost  (TRAVATAN ) 0.004% ophthalmic solution 1 Drop Both Eyes NIGHTLY   warfarin (COUMADIN) tablet 5 mg Oral NIGHTLY       Today's Physical Exam:  GENERAL:  Patient is alert, awake and oriented X 3. In no cardio respiratory distress. Elderly WF.  HEENT: PERRLA, EOMI.   NECK: Supple, NO JVD.   HEART: S1+, S2+, rate controlled and rhythm irregular. No murmurs appreciated.   LUNGS: Bilateral breath sounds fair, no crackles or wheezing.   ABDOMEN: Soft, NT, ND with NABS.   EXTREMITIES: Right knee in a cast. No dema, pedal pulses palpable.   NEURO: Motor exam is non focal.   SKIN: No rashes or bruises.   PSYCH: Pleasant with no signs of depression.      I/O:  I/O last 24 hours:    Intake/Output Summary (Last 24 hours) at 06/05/13 1343  Last data filed at 06/05/13 1300   Gross per 24 hour   Intake    240 ml   Output   1350 ml   Net  -1110 ml     I/O current shift:  11/29 0800 - 11/29 1559  In: 120 [P.O.:120]  Out: -     Nutrition/Residuals:       Labs  -  reviewed  Reviewed:   Lab Results for Last 24 Hours:    Results for orders placed during the hospital encounter of 05/31/13 (from the past 24 hour(s))   POCT FINGERSTICK GLUCOSE       Result Value Range    BLD GLUCOSE POCT 104 (*) 60 -  100 mg/dL   POCT FINGERSTICK GLUCOSE       Result Value Range    BLD GLUCOSE POCT 125 (*) 60 - 100 mg/dL   PT/INR       Result Value Range    PROTHROMBIN TIME 10.7  9.8 - 11.0 sec    INR NORMALIZED 1.03     CBC       Result Value Range    WBC 7.5  4.0 - 11.0 K/uL    RBC 2.85 (*) 4.00 - 5.10 M/uL    HGB 10.2 (*) 12.0 - 15.5 g/dL    HCT 70.9 (*) 63.9 - 45.0 %    MCV 102.0 (*) 82.0 - 97.0 fL    MCH 35.9 (*) 28.0 - 34.0 pg    MCHC 35.4  33.0 - 37.0 g/dL    RDW 87.8  88.9 - 86.9 %    PLATELET COUNT 228  150 - 400 K/uL    MPV 6.6 (*) 7.0 - 9.4 fL    PMN % 71.7  43.0 - 76.0 %    LYMPHOCYTE % 18.6  15.0 - 43.0 %    MONOCYTE % 7.5  4.8 - 12.0 %    EOSINOPHIL % 2.1  0.0 - 5.2 %    BASOPHILS % 0.1  0.0 - 1.4 %    PMN # 5.34  1.50 - 6.50 K/uL    LYMPHOCYTE # 1.39  0.70 - 3.20 K/uL    MONOCYTE # 0.56  0.20 - 0.90 K/uL    EOSINOPHIL # 0.15  0.00 - 0.50 K/uL    BASOPHIL # 0.01  0.00 - 0.10 K/uL   POCT  FINGERSTICK GLUCOSE       Result Value Range    BLD GLUCOSE POCT 107 (*) 60 - 100 mg/dL   POCT FINGERSTICK GLUCOSE       Result Value Range    BLD GLUCOSE POCT 177 (*) 60 - 100 mg/dL     Ordered:      Diagnostic Tests -  Reviewed    Radiology Tests -  Reviewed    Problem List:  Active Hospital Problems   (*Primary Problem)    Diagnosis   . *Displacement of internal right knee prosthesis   . Periprosthetic fracture around internal prosthetic right hip joint   . Atrial fibrillation   . Type II or unspecified type diabetes mellitus without mention of complication, not stated as uncontrolled   . Coumadin toxicity       Assessment/ Plan:     Right knee pain from Peri-prosthetic fracture - S/P ORIF for fracture of the femur on 11/26 by Dr. Arvell.   Atrial fibrillation. Cont coumadin - rate controlled on Atenolol .   Type II Diabetes Mellitus - blood sugars are now controlled with Lantus  with pre meal Apidra  coverage.   Glaucoma - on Travatan  eye drops.  Acute blood loss anemia - post-operatively - follow Serial labs.  PLAN:  Disposition: To Rehab a few days.  Patient is from [place], [place] and son is also not from here - son reports that his brother's wife is a Engineer, civil (consulting) in [place]  - CM may need to co-ordinate her care.  Patient and son were updated at bedside.

## 2013-06-05 NOTE — Care Plan (Signed)
Problem: General Plan of Care(Adult,OB)  Goal: Plan of Care Review(Adult,OB)  The patient and/or their representative will communicate an understanding of their plan of care   Outcome: Ongoing (see interventions/notes)  Assessment: Max A of 2 required for all mobility attempts. CPM set at 0-30 degrees flexion tolerated well. Pt. will continue to be progressed as tolerated.   Plan: Continue PT per plan of care   PT Recommendations for Nursing: Assist of 2 required for transfers and gait using rw   PT Recommendations for Patient/Family: Pt. to request assist from nursing staff when OOB activities are attempted.

## 2013-06-05 NOTE — Care Plan (Signed)
Problem: General Plan of Care(Adult,OB)  Goal: Plan of Care Review(Adult,OB)  The patient and/or their representative will communicate an understanding of their plan of care   Outcome: Ongoing (see interventions/notes)  Pt needs reinforcement concerning plan of care. Has intermittent confusion. Awaiting snf placement in [place] near family members.  Bobbye Riggs, RN        Problem: Fall/Trauma/Injury Risk (Adult, Obstetrics)  Goal: Absence of Trauma/Injury/Falls  Patient will demonstrate the desired outcomes.   Outcome: Ongoing (see interventions/notes)  No falls this shift. Bed alarm set. No attempts on getting up alone.  Bobbye Riggs, RN        Problem: Fractured Hip (Adult)  Prevent and manage potential problems including:1. acute cognitive dysfunction leading to delirium/acute confusion2. acute pain3. bleeding/hematoma4. constipation5. embolism leading to tissue ischemia/infarction6. fluid imbalance7. functional deficit/self-care deficit8. infection leading to sepsis9. pneumonia10. situational response11. skin breakdown12. undernutrition13. voiding dysfunction   Goal: Potential Problems (Fractured Hip)  Signs and symptoms of listed potential problems will be absent or manageable (reference Fractured Hip (Adult) CPG)   Outcome: Ongoing (see interventions/notes)  RLE fx repaired 11/26. Dressing dry&intact. Knee immobilizer in place. Neurovascularly intact, no numbness/tingling present. Medicated for pain per MD order. For continued PT in am.  Bobbye Riggs, RN        Problem: Pressure Ulcer Risk (Using Braden Scale) (Adult, Obstetrics, Pediatric)  Goal: Skin Integrity  Patient will demonstrate the desired outcomes.   Outcome: Ongoing (see interventions/notes)  No skin breakdown noted at this time. Turned & repositioned for comfort. Educated on importance of repositioning. Bilateral heels floating.   Bobbye Riggs, RN

## 2013-06-05 NOTE — Care Plan (Signed)
Problem: General Plan of Care(Adult,OB)  Goal: Plan of Care Review(Adult,OB)  The patient and/or their representative will communicate an understanding of their plan of care   Outcome: Ongoing (see interventions/notes)  Assessment: Pt. reported increased pain through RLE with WB'ing activities, but was able to take 3-4 short and labored steps with rw to bedside chair with Mod A of 2. SNF recommended for continued rehab. post d/c.   Plan: Continue PT per plan of care   PT Recommendations for Nursing: Assist of 2 required for transfers and gait using rw   PT Recommendations for Patient/Family: Pt. to request assist from nursing staff when OOB activities are attempted.

## 2013-06-05 NOTE — Progress Notes (Signed)
Afebrile. Cheerful. Walking still very difficult and still painful.  Wound looks good. Hct 29.0. This is postoperative blood loss anemia.  Will try CPM.

## 2013-06-05 NOTE — Care Plan (Signed)
Problem: Fall/Trauma/Injury Risk (Adult, Obstetrics)  Goal: Absence of Trauma/Injury/Falls  Patient will demonstrate the desired outcomes.   Outcome: Ongoing (see interventions/notes)  Patient has not attempted to get out of bed without assistance; call bell within reach; patient aware of how to use call bell; no falls noted.    Problem: Fractured Hip (Adult)  Prevent and manage potential problems including:1. acute cognitive dysfunction leading to delirium/acute confusion2. acute pain3. bleeding/hematoma4. constipation5. embolism leading to tissue ischemia/infarction6. fluid imbalance7. functional deficit/self-care deficit8. infection leading to sepsis9. pneumonia10. situational response11. skin breakdown12. undernutrition13. voiding dysfunction   Goal: Potential Problems (Fractured Hip)  Signs and symptoms of listed potential problems will be absent or manageable (reference Fractured Hip (Adult) CPG)   Outcome: Ongoing (see interventions/notes)  Patient with CPM on this afternoon and removed at 1700; patient tolerating without difficulty.

## 2013-06-05 NOTE — PT Treatment (Signed)
[facility]  [place], [place] [address]    Physical Therapy Progress Note      Subjective: "I don't think I can take that brace off until the doctor comes."  PT staff spoke with RN who stated that MD wants knee immobilizer on RLE when pt. is OOB.       Areas of functional mobility addressed: 15 minutes     Assistive devices used with bed mobility:  Sheet     Supine - Sit:  Maximum assist, 2 person assist, Requires assist with trunk and Requires assist with lower extremities     Sit - stand:  Maximum assist and 2 person     Bed - Chair/ Chair - bed:  Moderate assist and 2 person     Balance while sitting:  Able to maintain position     Balance while standing: Partial physical support     Comments:  Knee immobilizer in place RLE. Supine to sit performed with Max A of 2 using sheet to assist pt. to edge of bed. Pt. denied any dizziness in sitting position. Sit to stand performed at rw with Max A of 2 and pt. was able to take 3-4 short and labored steps to bedside chair with WBAT RLE with Mod A of 2. Pt. left up in chair with call bell placed within pt's reach.         Bed alarm reset:  No- Pt. left up in chair with call bell placed within pt's reach.      Assessment: Pt. reported increased pain through RLE with WB'ing activities, but was able to take 3-4 short and labored steps with rw to bedside chair with Mod A of 2. SNF recommended for continued rehab. post d/c.    Plan:  Continue PT per plan of care    PT Recommendations for Nursing: Assist of 2 required for transfers and gait using rw  PT Recommendations for Patient/Family: Pt. to request assist from nursing staff when OOB activities are attempted.     Annabell Sabal, PT

## 2013-06-05 NOTE — Care Plan (Signed)
 Problem: General Plan of Care(Adult,OB)  Goal: Plan of Care Review(Adult,OB)  The patient and/or their representative will communicate an understanding of their plan of care   Outcome: Ongoing (see interventions/notes)  Pt cheerful and doing well, reports no pain today. Working with PT/OT with mild difficulty. See fractured hip notes regarding neurovascular assessments. Plan is to be discharged to a skilled facility in [place] , unsure of when at this point. Pt verbalizes understanding of plan of care. Resting comfortably at this time with family at bedside. Call bell within reach, will continue to monitor for remainder of shift.  Joesph Glatter, RN        Problem: Fall/Trauma/Injury Risk (Adult, Obstetrics)  Goal: Absence of Trauma/Injury/Falls  Patient will demonstrate the desired outcomes.   Outcome: Ongoing (see interventions/notes)  Fall risk protocol in place at this time. Pt aware to call for assistance when needing to ambulate.        Problem: Fractured Hip (Adult)  Prevent and manage potential problems including:1. acute cognitive dysfunction leading to delirium/acute confusion2. acute pain3. bleeding/hematoma4. constipation5. embolism leading to tissue ischemia/infarction6. fluid imbalance7. functional deficit/self-care deficit8. infection leading to sepsis9. pneumonia10. situational response11. skin breakdown12. undernutrition13. voiding dysfunction   Goal: Potential Problems (Fractured Hip)  Signs and symptoms of listed potential problems will be absent or manageable (reference Fractured Hip (Adult) CPG)   Outcome: Ongoing (see interventions/notes)  Pt ambulating/working with PT/OT today with mild difficulty. FWW in pt room. Pt reports no pain today. Will use 2 assists for pt transfer and ambulation with walker per PT/OT recommendations. Neurovascularly intact.  R dorsal pulse weak but palpable. SCD's placed on left leg. MD in today and changed dressing. Knee immobilizer off at this time but can  be used prn. Pt using CPM at this time.     Problem: Pressure Ulcer Risk (Using Braden Scale) (Adult, Obstetrics, Pediatric)  Goal: Skin Integrity  Patient will demonstrate the desired outcomes.   Outcome: Ongoing (see interventions/notes)  No evidence of skin breakdown upon assessment.

## 2013-06-06 LAB — PERFORM POC FINGERSTICK GLUCOSE
BLD GLUCOSE POCT: 104 mg/dL — ABNORMAL HIGH (ref 60–100)
BLD GLUCOSE POCT: 107 mg/dL — ABNORMAL HIGH (ref 60–100)

## 2013-06-06 LAB — COMPREHENSIVE METABOLIC PROFILE - BMC/JMC ONLY
ALBUMIN: 2.2 g/dL — AB (ref 3.2–5.0)
ALKALINE PHOSPHATASE: 60 IU/L (ref 35–120)
ALT (SGPT): 10 IU/L (ref 0–55)
AST (SGOT): 16 IU/L (ref 0–45)
BILIRUBIN, TOTAL: 1.5 mg/dL — ABNORMAL HIGH (ref 0.0–1.3)
BUN: 14 mg/dL (ref 6–22)
CALCIUM: 8.7 mg/dL (ref 8.5–10.5)
CARBON DIOXIDE: 31 mmol/L (ref 22–32)
CHLORIDE: 108 mmol/L (ref 101–111)
CREATININE: 0.57 mg/dL (ref 0.53–1.00)
ESTIMATED GLOMERULAR FILTRATION RATE: >60 mL/min (ref >60)
GLUCOSE: 102 mg/dL (ref 70–110)
POTASSIUM: 3.7 mmol/L (ref 3.5–5.0)
SODIUM: 142 mmol/L (ref 136–145)
TOTAL PROTEIN: 4.6 g/dL — ABNORMAL LOW (ref 6.0–8.0)

## 2013-06-06 LAB — CBC
EOSINOPHIL %: 2.4 % (ref 0.0–5.2)
HCT: 29.3 % — ABNORMAL LOW (ref 36.0–45.0)
HGB: 9.9 g/dL — ABNORMAL LOW (ref 12.0–15.5)
LYMPHOCYTE %: 16.9 % (ref 15.0–43.0)
MCH: 32.2 pg (ref 28.0–34.0)
MCHC: 33.8 g/dL (ref 33.0–37.0)
MCV: 95.3 fL — AB (ref 82.0–97.0)
MONOCYTE #: 0.55 K/uL (ref 0.20–0.90)
MPV: 5.6 fL — ABNORMAL LOW (ref 7.0–9.4)
PLATELET COUNT: 251 10*3/uL (ref 150–400)
PMN #: 6.18 K/uL (ref 1.50–6.50)
PMN %: 73.1 % (ref 43.0–76.0)
RBC: 3.07 M/uL — ABNORMAL LOW (ref 4.00–5.10)
RDW: 12.5 % (ref 11.0–13.0)
WBC: 8.5 10*3/uL (ref 4.0–11.0)

## 2013-06-06 LAB — PT/INR
INR NORMALIZED: 1.06
PROTHROMBIN TIME: 11.1 s — ABNORMAL HIGH (ref 9.8–11.0)

## 2013-06-06 MED ADMIN — omeprazole 20 mg capsule,delayed release: 20 mg | ORAL | NDC 00904568461

## 2013-06-06 MED FILL — omeprazole 20 mg capsule,delayed release: 20.0000 mg | ORAL | Qty: 1 | Status: AC

## 2013-06-06 NOTE — Care Plan (Signed)
 Problem: General Plan of Care(Adult,OB)  Goal: Plan of Care Review(Adult,OB)  The patient and/or their representative will communicate an understanding of their plan of care   Outcome: Ongoing (see interventions/notes)  Pt A&O x 2. Intermittent confusion with place and situation. VSS. IV to R wrist, intact, flushes without difficulty, saline locked. Lungs clear in all fields, Heart rhythm is irregular r/t a-fib. BS positive x 4 quads. LBM today. Dressing to R knee dry and intact. Cap refill in RLE > 3 seconds, R pedal pulse weak but palpable. R pedal pulse strong. Skin cool, dry and flaky. Pt is 2 max assist with FWW when ambulating. Pt resting comfortably in between care. No complaints at this time. Plan is for pt to be D/C to SNF in [place] where she has family awaiting approval. Call bell within reach, will continue to monitor.  Joesph Glatter, RN        Problem: Fall/Trauma/Injury Risk (Adult, Obstetrics)  Goal: Absence of Trauma/Injury/Falls  Patient will demonstrate the desired outcomes.   Outcome: Ongoing (see interventions/notes)  Fall risk protocol in place at this time. Pt aware to call for assistance when needing to ambulate.        Problem: Fractured Hip (Adult)  Prevent and manage potential problems including:1. acute cognitive dysfunction leading to delirium/acute confusion2. acute pain3. bleeding/hematoma4. constipation5. embolism leading to tissue ischemia/infarction6. fluid imbalance7. functional deficit/self-care deficit8. infection leading to sepsis9. pneumonia10. situational response11. skin breakdown12. undernutrition13. voiding dysfunction   Goal: Potential Problems (Fractured Hip)  Signs and symptoms of listed potential problems will be absent or manageable (reference Fractured Hip (Adult) CPG)   Outcome: Ongoing (see interventions/notes)  Pt does not complain of any pain this shift. Resting comfortably throughout shift. Knee immobilizer not on at this time but available prn per pt comfort.  Nursing will use 2-assist with FWW when needing to ambulate/reposition per PT recommendation. See general plan of care note regarding neurovascular assessment.    Problem: Pressure Ulcer Risk (Using Braden Scale) (Adult, Obstetrics, Pediatric)  Goal: Skin Integrity  Patient will demonstrate the desired outcomes.   Outcome: Ongoing (see interventions/notes)  No evidence of skin breakdown upon assessment.

## 2013-06-06 NOTE — Progress Notes (Signed)
 Afebrile. In good spirits. Foley out. Tolerating CPM.  Hct29.3.Progress slow because of age and severity of fracture.   Family and case management working on finding SNF close to her son's home in [place] .  Continue rehab.SABRA

## 2013-06-06 NOTE — Nurses Notes (Signed)
 D/C foley catheter. Pt tolerated well. Emptied 200 cc of tea-colored, clear urine.

## 2013-06-06 NOTE — Progress Notes (Signed)
 [facility]  [place], [place] [address]    IP PROGRESS NOTE      Bridget Hall, Bridget Hall  Date of Admission:  05/31/2013  Date of Birth:  Mar 05, 1925  Date of Service:  06/06/2013    Chief Complaint: Patient was admitted for Peri-prosthetic femoral fracture of right knee S/P Surgery.      Subjective:  Patient reports feeling better today. Denies any CP or SOB. She also reports that her pain is better controlled S/P surgery. Her son is at bedside.     Vital Signs:  Temp (24hrs) Max:36.9 C (98.5 F)      Temperature: 36.4 C (97.5 F)  BP (Non-Invasive): 93/70 mmHg  MAP (Non-Invasive): 71 mmHG  Heart Rate: 84  Respiratory Rate: 18  Pain Score (Numeric, Faces): 8  SpO2-1: 97 %    Current Medications:    Current Facility-Administered Medications:  acetaminophen  (TYLENOL ) tablet 650 mg Oral Q6H PRN   atenolol  (TENORMIN ) tablet 50 mg Oral Daily   bisacodyl  (DULCOLAX) enteric coated tablet 10 mg Oral HS PRN   cyclobenzaprine  (FLEXERIL ) tablet 10 mg Oral 3x/day PRN   docusate sodium  (COLACE) capsule 100 mg Oral 2x/day   enoxaparin  (LOVENOX ) 40 mg/0.4 mL SubQ injection 40 mg Subcutaneous Daily   insulin  glargine (LANTUS ) 100 units/mL injection 15 Units Subcutaneous 2x/day   insulin  glulisine (APIDRA ) 100 units/mL injection 5 Units Subcutaneous 3x/day AC   morphine  4 mg/mL injection 4 mg Intravenous Q5 Min PRN   morphine  4 mg/mL injection 4 mg Intravenous Q2H PRN   nitroglycerin  (NITROSTAT ) sublingual tablet 0.4 mg Sublingual Q5 Min PRN   NS flush syringe 10 mL Intravenous Q8HRS   ondansetron  (ZOFRAN ) 2 mg/mL injection 4 mg Intravenous Q6H PRN   oxyCODONE -acetaminophen  (PERCOCET) 5-325mg  per tablet 1 Tab Oral Q4H PRN   polysaccharide iron  complex capsule 150 mg Oral Daily   travoprost  (TRAVATAN ) 0.004% ophthalmic solution 1 Drop Both Eyes NIGHTLY   warfarin (COUMADIN) tablet 5 mg Oral NIGHTLY       Today's Physical Exam:  GENERAL: Patient is alert, awake and oriented X 3. In no cardio respiratory  distress. Elderly WF.  HEENT: PERRLA, EOMI.   NECK: Supple, NO JVD.   HEART: S1+, S2+, rate controlled and rhythm irregular. No murmurs appreciated.   LUNGS: Bilateral breath sounds fair, no crackles or wheezing.   ABDOMEN: Soft, NT, ND with NABS.   EXTREMITIES: Right knee in a cast. No dema, pedal pulses palpable.   NEURO: Motor exam is non focal.   SKIN: No rashes or bruises.   PSYCH: Pleasant with no signs of depression.      I/O:  I/O last 24 hours:      Intake/Output Summary (Last 24 hours) at 06/06/13 1848  Last data filed at 06/06/13 1253   Gross per 24 hour   Intake    120 ml   Output    550 ml   Net   -430 ml     I/O current shift:       Nutrition/Residuals:       Labs  -  reviewed  Reviewed:   Lab Results for Last 24 Hours:    Results for orders placed during the hospital encounter of 05/31/13 (from the past 24 hour(s))   POCT FINGERSTICK GLUCOSE       Result Value Range    BLD GLUCOSE POCT 160 (*) 60 - 100 mg/dL   PT/INR       Result Value Range  PROTHROMBIN TIME 11.1 (*) 9.8 - 11.0 sec    INR NORMALIZED 1.06     CBC       Result Value Range    WBC 8.5  4.0 - 11.0 K/uL    RBC 3.07 (*) 4.00 - 5.10 M/uL    HGB 9.9 (*) 12.0 - 15.5 g/dL    HCT 70.6 (*) 63.9 - 45.0 %    MCV 95.3 (*) 82.0 - 97.0 fL    MCH 32.2  28.0 - 34.0 pg    MCHC 33.8  33.0 - 37.0 g/dL    RDW 87.4  88.9 - 86.9 %    PLATELET COUNT 251  150 - 400 K/uL    MPV 5.6 (*) 7.0 - 9.4 fL    PMN % 73.1  43.0 - 76.0 %    LYMPHOCYTE % 16.9  15.0 - 43.0 %    MONOCYTE % 6.6  4.8 - 12.0 %    EOSINOPHIL % 2.4  0.0 - 5.2 %    BASOPHILS % 1.1  0.0 - 1.4 %    PMN # 6.18  1.50 - 6.50 K/uL    LYMPHOCYTE # 1.43  0.70 - 3.20 K/uL    MONOCYTE # 0.55  0.20 - 0.90 K/uL    EOSINOPHIL # 0.20  0.00 - 0.50 K/uL    BASOPHIL # 0.09  0.00 - 0.10 K/uL   COMPREHENSIVE METABOLIC PROFILE - [facility] ONLY       Result Value Range    GLUCOSE 102  70 - 110 mg/dL    BUN 14  6 - 22 mg/dL    CREATININE 9.42  9.46 - 1.00 mg/dL    ESTIMATED GLOMERULAR FILTRATION RATE >60  >60 ml/min     SODIUM 142  136 - 145 mmol/L    POTASSIUM 3.7  3.5 - 5.0 mmol/L    CHLORIDE 108  101 - 111 mmol/L    CARBON DIOXIDE 31  22 - 32 mmol/L    CALCIUM 8.7  8.5 - 10.5 mg/dL    TOTAL PROTEIN 4.6 (*) 6.0 - 8.0 g/dL    ALBUMIN 2.2 (*) 3.2 - 5.0 g/dL    BILIRUBIN, TOTAL 1.5 (*) 0.0 - 1.3 mg/dL    AST (SGOT) 16  0 - 45 IU/L    ALT (SGPT) 10 (*) 0 - 55 IU/L    ALKALINE PHOSPHATASE 60  35 - 120 IU/L   POCT FINGERSTICK GLUCOSE       Result Value Range    BLD GLUCOSE POCT 104 (*) 60 - 100 mg/dL   POCT FINGERSTICK GLUCOSE       Result Value Range    BLD GLUCOSE POCT 107 (*) 60 - 100 mg/dL   POCT FINGERSTICK GLUCOSE       Result Value Range    BLD GLUCOSE POCT 74  60 - 100 mg/dL     Ordered:      Diagnostic Tests -  Reviewed    Radiology Tests -  Reviewed    Problem List:  Active Hospital Problems   (*Primary Problem)    Diagnosis   . *Displacement of internal right knee prosthesis   . Periprosthetic fracture around internal prosthetic right hip joint   . Atrial fibrillation   . Type II or unspecified type diabetes mellitus without mention of complication, not stated as uncontrolled   . Coumadin toxicity       Assessment/ Plan:     Right knee pain from Peri-prosthetic fracture - S/P  ORIF for fracture of the femur on 11/26 by Dr. Arvell.   Atrial fibrillation. Continue coumadin - rate controlled on Atenolol . INR is lower.   Type II Diabetes Mellitus - blood sugars are now controlled with Lantus  with pre meal Apidra  coverage.   Glaucoma - on Travatan  eye drops.  Acute blood loss anemia - post-operatively - follow Serial labs.  PLAN:  Disposition: To Rehab a few days.  Patient is from [place], [place] and son is also not from here - son reports that his brother's wife is a Engineer, civil (consulting) in [place]  - CM to co-ordinate her care.  Patient and son were updated at bedside.   On Lovenox  for DVT prophylaxis - as INR is not therapeutic yet.   Will add Omeprazole  for GI prophylaxis.

## 2013-06-06 NOTE — Nurses Notes (Signed)
Patient assisted to [facility] with 2 person assist.. Patient voided 350cc urine and had a BM... Assisted back to bed, rails up, call light in reach.Marland KitchenMarland Kitchen

## 2013-06-07 ENCOUNTER — Inpatient Hospital Stay (HOSPITAL_BASED_OUTPATIENT_CLINIC_OR_DEPARTMENT_OTHER): Payer: Medicare Other

## 2013-06-07 LAB — PERFORM POC FINGERSTICK GLUCOSE
BLD GLUCOSE POCT: 90 mg/dL (ref 60–100)
BLD GLUCOSE POCT: 119 mg/dL — ABNORMAL HIGH (ref 60–100)

## 2013-06-07 LAB — PT/INR
INR NORMALIZED: 1.09
PROTHROMBIN TIME: 11.4 s — ABNORMAL HIGH (ref 9.8–11.0)

## 2013-06-07 LAB — COMPREHENSIVE METABOLIC PROFILE - BMC/JMC ONLY
ALBUMIN: 2.3 g/dL — ABNORMAL LOW (ref 3.2–5.0)
ALKALINE PHOSPHATASE: 70 IU/L (ref 35–120)
ALT (SGPT): 12 IU/L (ref 0–55)
AST (SGOT): 15 IU/L (ref 0–45)
BILIRUBIN, TOTAL: 1.8 mg/dL — ABNORMAL HIGH (ref 0.0–1.3)
BUN: 14 mg/dL (ref 6–22)
CALCIUM: 9 mg/dL (ref 8.5–10.5)
CARBON DIOXIDE: 28 mmol/L (ref 22–32)
CHLORIDE: 102 mmol/L (ref 101–111)
CREATININE: 0.65 mg/dL (ref 0.53–1.00)
ESTIMATED GLOMERULAR FILTRATION RATE: >60 mL/min (ref >60)
GLUCOSE: 94 mg/dL (ref 70–110)
POTASSIUM: 4.1 mmol/L (ref 3.5–5.0)
SODIUM: 135 mmol/L — ABNORMAL LOW (ref 136–145)
TOTAL PROTEIN: 4.5 g/dL — ABNORMAL LOW (ref 6.0–8.0)

## 2013-06-07 LAB — CBC
BASOPHIL #: 0.05 K/uL (ref 0.00–0.10)
BASOPHILS %: 0.6 % (ref 0.0–1.4)
EOSINOPHIL #: 0.18 K/uL (ref 0.00–0.50)
EOSINOPHIL %: 2.1 % (ref 0.0–5.2)
HCT: 28.6 % — ABNORMAL LOW (ref 36.0–45.0)
HGB: 9.8 g/dL — ABNORMAL LOW (ref 12.0–15.5)
LYMPHOCYTE #: 1.22 10*3/uL (ref 0.70–3.20)
LYMPHOCYTE %: 14 % — ABNORMAL LOW (ref 15.0–43.0)
MCH: 34.8 pg — ABNORMAL HIGH (ref 28.0–34.0)
MCHC: 34.4 g/dL (ref 33.0–37.0)
MCV: 101 fL (ref 82.0–97.0)
MONOCYTE #: 0.6 10*3/uL (ref 0.20–0.90)
MONOCYTE %: 6.9 % (ref 4.8–12.0)
MPV: 6 fL — ABNORMAL LOW (ref 7.0–9.4)
PLATELET COUNT: 307 10*3/uL (ref 150–400)
PMN #: 6.64 K/uL — ABNORMAL HIGH (ref 1.50–6.50)
PMN %: 76.4 % — ABNORMAL HIGH (ref 43.0–76.0)
RBC: 2.82 M/uL — ABNORMAL LOW (ref 4.00–5.10)
RDW: 12 % (ref 11.0–13.0)
WBC: 8.7 10*3/uL (ref 4.0–11.0)

## 2013-06-07 MED ORDER — WARFARIN 7.5 MG TABLET
7.50 mg | ORAL_TABLET | Freq: Every evening | ORAL | Status: DC
Start: 2013-06-07 — End: 2013-06-09
  Administered 2013-06-07 – 2013-06-08 (×2): 7.5 mg via ORAL
  Filled 2013-06-07 (×2): qty 1

## 2013-06-07 NOTE — PT Treatment (Signed)
[facility]  [place], [place] [address]    Physical Therapy Progress Note      Subjective:  Patient willing to work with [place].  States that she is very fearful of falling.  Does report pain in the operative knee with mobility.   Numeric pain scale:  7out of 10      Areas of gait training assessed:  15 minutes      Ambulation surface:  Level and In patient's room     Ambulation distance:  5 feet     Ambulation ability:  Minimum assist and 2 person     Assistive devices:  Rolling walker       Ambulation tolerance:  Fair     Comments: patient was able to ambulate the above distance with min assist of 2.  Patient did not bear much weight on the operative leg.  Did require constant cueing to for the proper progression of her feet and the proper use of the walker.  Brace was used for gait.  However, the brace is not fitting well. It slid down during gait as I could not get the straps tight enough.  I would recommend a TROM if she must continue to wear it.  Nursing was notified of this    Areas of functional mobility addressed: 10 minutes        Supine - Sit:  Maximum assist and 1 person assist     Sit - Supine:  Up in chair post gait     Sit - stand:  Minimum assist, Moderate assist and 2 person           Balance while sitting:  Unsteady - no physical help     Balance while standing: Partial physical support     Comments:  Patient was able to get to the side of the bed with max assist of 1.  Stood with mod to max assist of 2 and the FWW.  Patient up in a chair post gait.          Assessment:  Patient is making progress  She was able to walk this date a short distance.  Patient is very fearful of falling.  Recommend continued PT as well as a SNF stay post DC.    Plan:  Continue PT per plan of care    PT Recommendations for Nursing:  Use the bedside commode with assist of 2  PT Recommendations for Patient/Family::   Call for help to use the bedside commode      Hedy Jacob, PT

## 2013-06-07 NOTE — Nurses Notes (Signed)
 ASSUMED CARE OF PATIENT - NO SIGNIFICANT CHANGES SINCE PREVIOUS ASSESSMENT. CURRENTLY RESTING COMFORTABLY IN BED AT THIS TIME.     NO C/O OF PAIN OR DISCOMFORT AT THIS TIME - WILL CONTINUE TO MONITOR AND FOLLOW TREATMENT PLAN.    VSS - THAT WERE TAKEN BY [facility]

## 2013-06-07 NOTE — Care Management Notes (Signed)
SS: Updates sent to 3 facilities ([facility], [facility], and [facility]).

## 2013-06-07 NOTE — CDI REVIEW (Signed)
 [facility] - Follow Up Review     Working DRG:  481  Dx:  Displaced Knee Prosthesis [phone], Afib [id number], Coumadin Tox [id number], DM 25000. Fx [phone], PostOp Blood Loss Anemia 2851    Proc:  ORIF Femur 7935

## 2013-06-07 NOTE — Nurses Notes (Signed)
Patient stated feet were cold and starting to hurt. Applied grip socks, another blanket and propped feet with pillow.

## 2013-06-07 NOTE — Care Plan (Signed)
Problem: General Plan of Care(Adult,OB)  Goal: Plan of Care Review(Adult,OB)  The patient and/or their representative will communicate an understanding of their plan of care   Outcome: Ongoing (see interventions/notes)  Assessment: Patient is making progress. She was actually able to ambulate this afternoon with the FWW. Patient does become very fatigued and is very fearful of falling. recommend continued PT as well as a SNF stay post DC   Plan: Continue PT per plan of care   PT Recommendations for Nursing: No new recommendations   Recommendations for Patient/Family:: No new recommendations   Hedy Jacob, PT

## 2013-06-07 NOTE — Care Management Notes (Signed)
 SS: [facility] (unable to accept patient).

## 2013-06-07 NOTE — Progress Notes (Signed)
Still cheerful. Afebrile. Making a little progress with PT.   Hct 28.6. Prothrombin coming up very slowly. Continue rehab.

## 2013-06-07 NOTE — Care Plan (Signed)
Problem: General Plan of Care(Adult,OB)  Goal: Plan of Care Review(Adult,OB)  The patient and/or their representative will communicate an understanding of their plan of care   Outcome: Ongoing (see interventions/notes)  Patient has some confusion, but is also alert and cooperative.  She used her call bell to call for bedpan assist, however PT recommends she use the bedside commode with 2 person assist.  Brace in room is not fitting or sliding with activity.  Medicated with pill for #9 pain, with relief.  Dressing is dry and intact with only a scant drainage.

## 2013-06-07 NOTE — Care Management Notes (Signed)
 SS: Updates to [facility].

## 2013-06-07 NOTE — Nurses Notes (Signed)
Patient requested bed pan. Voided in bed due to bed pan placement complications. Immediately followed with bed bath and linen change.

## 2013-06-07 NOTE — Care Management Notes (Signed)
Contacted Vee with [facility].  She indicated they have a facility in [place], [place], which has a female bed opening.  Referral has been sent and she is reviewing. Family has been advised accordingly.

## 2013-06-07 NOTE — Care Management Notes (Signed)
SS: Frederico Hamman contacted me concerning my phone call on Friday. She is from [facility], [place] and related that she may be able to assist in getting the patient to a [facility] facility. She had been contacted through the family to assist. Delice Bison will contact [facility] in hopes of finding out if patient can be accepted at their facility. I will send updates in [employer] for [facility].

## 2013-06-07 NOTE — PT Treatment (Signed)
[facility]  [place], [place] [address]    Physical Therapy Progress Note      Subjective:  Patient was willing to work with [place].  Does become slightly anxious about being upright.  Fearful of falling.  Complained of being very fatigue post gait activities.  "That's hard work!"  I did speak with Dr. Margaretha Sheffield about a TROM.  He will be checking the x ray and then let us know if we are to put one on.   Numeric pain scale:  7out of 10      Areas of gait training assessed:  15 minutes      Ambulation surface:  Level and In patient's room     Ambulation distance:  20     Ambulation ability:  Minimum assist, Moderate assist and 2 person     Assistive devices:  Rolling walker       Ambulation tolerance:  Fair     Comments: patient was able to ambulate the above distance.  Initially she required min assist of 2 for gait activities.  However about 5 feet before reaching the bed she was requiring mod assist of 2 as she was very fatigued.  She did also require cueing for the correct progression of her feet and for proper use of the walker during the session.  Able to bear weight on the operative leg.  The knee immobilizer was used but it did slip when upright.  I did speak with Dr. Margaretha Sheffield about it.    Areas of functional mobility addressed: 15 minutes         Supine - Sit:  Moderate assist and 1 person assist     Sit - Supine:  Moderate assist and 2 person assist     Sit - stand:  Minimum assist, Moderate assist and 2 person         Balance while sitting:  Unsteady - no physical help     Balance while standing: Partial physical support     Comments:  Patient was able to get to the side of the bed with mod assist of 1.  Stood with the FWW and min to mod assist of 2.  Returned to bed post gait with mod assist of 2.  Patient complained of pain with getting in and OOB.    CPM was placed on patient 0-45 degrees as per Dr. Magdalene Molly.  Tolerated this very well.        Assessment:  Patient is making progress.   She was actually able to ambulate this afternoon with the FWW.  Patient does become very fatigued and is very fearful of falling.  recommend continued PT as well as a SNF stay post DC    Plan:  Continue PT per plan of care    PT Recommendations for Nursing:  No new recommendations  Recommendations for Patient/Family::  No new recommendations    Hedy Jacob, PT

## 2013-06-07 NOTE — Progress Notes (Signed)
[facility]  [place], [place] [address]    IP PROGRESS NOTE      Cing, [place]  Date of Admission:  05/31/2013  Date of Birth:  [birth year]  Date of Service:  06/07/2013    Chief Complaint: no fever, cough.  I feel ok.  Subjective:  Knee pain better now.  No new complaints.      Vital Signs:  Temp (24hrs) Max:36.8 C (98.2 F)      Temperature: 36.8 C (98.2 F)  BP (Non-Invasive): 120/70 mmHg  MAP (Non-Invasive): 71 mmHG  Heart Rate: 92  Respiratory Rate: 20  Pain Score (Numeric, Faces): 4  SpO2-1: 96 %    Current Medications:    Current Facility-Administered Medications:  acetaminophen (TYLENOL) tablet 650 mg Oral Q6H PRN   atenolol (TENORMIN) tablet 50 mg Oral Daily   bisacodyl (DULCOLAX) enteric coated tablet 10 mg Oral HS PRN   cyclobenzaprine (FLEXERIL) tablet 10 mg Oral 3x/day PRN   docusate sodium (COLACE) capsule 100 mg Oral 2x/day   enoxaparin (LOVENOX) 40 mg/0.4 mL SubQ injection 40 mg Subcutaneous Daily   insulin glargine (LANTUS) 100 units/mL injection 15 Units Subcutaneous 2x/day   insulin glulisine (APIDRA) 100 units/mL injection 5 Units Subcutaneous 3x/day AC   morphine 4 mg/mL injection 4 mg Intravenous Q5 Min PRN   morphine 4 mg/mL injection 4 mg Intravenous Q2H PRN   nitroglycerin (NITROSTAT) sublingual tablet 0.4 mg Sublingual Q5 Min PRN   NS flush syringe 10 mL Intravenous Q8HRS   omeprazole (PRILOSEC) capsule 20 mg Oral Daily before Breakfast   ondansetron (ZOFRAN) 2 mg/mL injection 4 mg Intravenous Q6H PRN   oxyCODONE-acetaminophen (PERCOCET) 5-325mg  per tablet 1 Tab Oral Q4H PRN   polysaccharide iron complex capsule 150 mg Oral Daily   travoprost (TRAVATAN) 0.004% ophthalmic solution 1 Drop Both Eyes NIGHTLY   warfarin (COUMADIN) tablet 7.5 mg Oral NIGHTLY       Today's Physical Exam:  General: appears in good health. No distress.   Eyes: Pupils equal and round, reactive to light and accomodation.   HENT:Head atraumatic and normocephalic   Neck: No JVD or  thyromegaly or lymphadenopathy   Lungs: Clear to auscultation bilaterally.   Cardiovascular: Iregular rate and rhythm, no murmur,   Abdomen: Soft, non-tender, Bowel sounds normal, No hepatosplenomegaly   Extremities:  no cyanosis or edema. Lt knee dressing+   Skin: Skin warm and dry   Neurologic: Grossly normal   Lymphatics: No lymphadenopathy   Psychiatric:  Mood and affect normal.        I/O:  I/O last 24 hours:      Intake/Output Summary (Last 24 hours) at 06/07/13 1035  Last data filed at 06/07/13 0900   Gross per 24 hour   Intake    480 ml   Output    800 ml   Net   -320 ml     I/O current shift:  12/01 0800 - 12/01 1559  In: 360 [P.O.:360]  Out: -       Labs  Please indicate ordered or reviewed)  Reviewed:   Lab Results for Last 24 Hours:    Results for orders placed during the hospital encounter of 05/31/13 (from the past 24 hour(s))   POCT FINGERSTICK GLUCOSE       Result Value Range    BLD GLUCOSE POCT 107 (*) 60 - 100 mg/dL   POCT FINGERSTICK GLUCOSE       Result Value Range    BLD GLUCOSE  POCT 74  60 - 100 mg/dL   POCT FINGERSTICK GLUCOSE       Result Value Range    BLD GLUCOSE POCT 106 (*) 60 - 100 mg/dL   PT/INR       Result Value Range    PROTHROMBIN TIME 11.4 (*) 9.8 - 11.0 sec    INR NORMALIZED 1.09     CBC       Result Value Range    WBC 8.7  4.0 - 11.0 K/uL    RBC 2.82 (*) 4.00 - 5.10 M/uL    HGB 9.8 (*) 12.0 - 15.5 g/dL    HCT 16.1 (*) 09.6 - 45.0 %    MCV 101.0 (*) 82.0 - 97.0 fL    MCH 34.8 (*) 28.0 - 34.0 pg    MCHC 34.4  33.0 - 37.0 g/dL    RDW 04.5  40.9 - 81.1 %    PLATELET COUNT 307 (*) 150 - 400 K/uL    MPV 6.0 (*) 7.0 - 9.4 fL    PMN % 76.4 (*) 43.0 - 76.0 %    LYMPHOCYTE % 14.0 (*) 15.0 - 43.0 %    MONOCYTE % 6.9  4.8 - 12.0 %    EOSINOPHIL % 2.1  0.0 - 5.2 %    BASOPHILS % 0.6  0.0 - 1.4 %    PMN # 6.64 (*) 1.50 - 6.50 K/uL    LYMPHOCYTE # 1.22  0.70 - 3.20 K/uL    MONOCYTE # 0.60  0.20 - 0.90 K/uL    EOSINOPHIL # 0.18  0.00 - 0.50 K/uL    BASOPHIL # 0.05  0.00 - 0.10 K/uL      COMPREHENSIVE METABOLIC PROFILE - [facility] ONLY       Result Value Range    GLUCOSE 94  70 - 110 mg/dL    BUN 14  6 - 22 mg/dL    CREATININE 9.14  7.82 - 1.00 mg/dL    ESTIMATED GLOMERULAR FILTRATION RATE >60  >60 ml/min    SODIUM 135 (*) 136 - 145 mmol/L    POTASSIUM 4.1  3.5 - 5.0 mmol/L    CHLORIDE 102  101 - 111 mmol/L    CARBON DIOXIDE 28  22 - 32 mmol/L    CALCIUM 9.0  8.5 - 10.5 mg/dL    TOTAL PROTEIN 4.5 (*) 6.0 - 8.0 g/dL    ALBUMIN 2.3 (*) 3.2 - 5.0 g/dL    BILIRUBIN, TOTAL 1.8 (*) 0.0 - 1.3 mg/dL    AST (SGOT) 15  0 - 45 IU/L    ALT (SGPT) 12  0 - 55 IU/L    ALKALINE PHOSPHATASE 70  35 - 120 IU/L   POCT FINGERSTICK GLUCOSE       Result Value Range    BLD GLUCOSE POCT 110 (*) 60 - 100 mg/dL       Radiology Tests (Please indicate ordered or reviewed)  Reviewed: N/A    Problem List:  Active Hospital Problems   (*Primary Problem)    Diagnosis    *Displacement of internal right knee prosthesis    Periprosthetic fracture around internal prosthetic right hip joint    Atrial fibrillation    Type II or unspecified type diabetes mellitus without mention of complication, not stated as uncontrolled    Coumadin toxicity       Assessment/ Plan:   1. Right knee pain: S/p ORIF fracture femur 11/26 by Dr. Margaretha Sheffield.   2. Atrial fibrillation.  Cont  coumadin. Rate controlled with atenolol. INR still low. Increase coumadin dose to 7.5 mg daily.  3. DMII: controlled with Lantus with pre meal apidra.   4. Glaucoma. Cont travatan eye drops.  5. Post op acute blood loss anemia. Expected. Cont iron.  Cont current care.  Await rehab placement in [place].

## 2013-06-07 NOTE — Care Management Notes (Signed)
Advised pts. Son we need to have transportation available. As [facility] has offered a bed.

## 2013-06-07 NOTE — Care Management Notes (Signed)
 SS: Left a message for [place], CM concerning [facility] making contact.

## 2013-06-07 NOTE — Care Management Notes (Signed)
I have spoken with the son, Fara Olden.  He was advised we would need to look locally for a SNF facility if we do not receive any offers from the facilities of his choice in [place].  He was also reminded the expenses to [facility] for transport would be OOP expenses, that Medicare would not pay for the transport.  He is to telephone a relative and get back to me within a couple of hours.

## 2013-06-07 NOTE — Care Plan (Signed)
Problem: General Plan of Care(Adult,OB)  Goal: Plan of Care Review(Adult,OB)  The patient and/or their representative will communicate an understanding of their plan of care   Outcome: Ongoing (see interventions/notes)  Assessment: Patient is making progress She was able to walk this date a short distance. Patient is very fearful of falling. Recommend continued PT as well as a SNF stay post DC.   Plan: Continue PT per plan of care   PT Recommendations for Nursing: Use the bedside commode with assist of 2   PT Recommendations for Patient/Family:: Call for help to use the bedside commode   Hedy Jacob, PT

## 2013-06-07 NOTE — Care Management Notes (Signed)
 SS: Tammie from Admissions at [facility] contacted me to relate that patient's information has been re-evaluated and they would be willing to accept patient for placement. Tammie [phone], requested some follow up information. I forwarded message to [place], [place]. Albino and I also discussed this and [place], CM has been working with the family on placement.

## 2013-06-07 NOTE — Nurses Notes (Addendum)
 Patient stated leg was starting to hurt from sitting in chair. Helped her back to bed.Note was done under my supervision.  CANDIE Harsh, RN  Clinical [employer] JRTI

## 2013-06-08 LAB — PERFORM POC FINGERSTICK GLUCOSE: BLD GLUCOSE POCT: 130 mg/dL — ABNORMAL HIGH (ref 60–100)

## 2013-06-08 LAB — PT/INR
INR NORMALIZED: 1.25
PROTHROMBIN TIME: 13.1 s — ABNORMAL HIGH (ref 9.8–11.0)

## 2013-06-08 MED ADMIN — omeprazole 20 mg capsule,delayed release: 20 mg | ORAL | NDC 00904568461

## 2013-06-08 NOTE — PT Treatment (Signed)
[facility]  [place], [place] [address]    Physical Therapy Progress Note      Subjective:  Patient up in a chair by nursing.  New order was received for a TROM brace 0-30.  States she is fatigued from sitting up in the chair.   Numeric pain scale:  7out of 10    Areas of functional mobility addressed: 30 minutes          Supine - Sit:  Up in the chair by nursing     Sit - Supine:  Moderate assist and 2 person assist     Sit - stand:  Moderate assist and 2 person     Bed - Chair/ Chair - bed:  Minimum assist, Moderate assist and 2 person       Balance while sitting:  Able to maintain position     Balance while standing: Unsteady - no physical help     Comments:  Patient presented up in the chair with her knee immobilizer on.  Stood from the chair with mod assist of 2 and the FWW.    Transferred back to the bed with min to mod assist of 2 and the FWW.  Patient became very anxious with being upright and required verbal encouragement .  Once in bed she was fitted with the TROM brace 0-30 degrees flexion.  The fit was good.  Patient did report pain with movement of the leg during the fit.  She had a moderate amount of bloody drainage--nursing was able to observe it.  The area was reinforced with an ABD pad.  Patient stated that the brace felt more comfortable than the knee immobilizer.       Assessment:  Patient was very fatigued from being up in the chair.  The brace was a good fit and should work better than the knee immobilizer.  Recommend continued PT.  Feel she will need a SNF placement post DC.  There is some concern about how she will get to [place].  If the Doctor approves of going by car I do believe we would be able to transfer her into the car with only minimal difficulty.    Plan:  Continue PT per plan of care    PT Recommendations for Nursing:  No further recommendations  PT Recommendations for Patient/Family::  No further recommendations      Hedy Jacob, PT

## 2013-06-08 NOTE — OR Surgeon (Signed)
 [facility]                                  [place], [place] [address]                                     [phone]                           OPERATIVE SUMMARY    PATIENT NAME: Bridget Hall, Bridget Hall [facility] [id number]:[id number]  DATE OF SERVICE:06/02/2013  DATE OF BIRTH: Jul 04, 1925    PREOPERATIVE DIAGNOSIS:  Comminuted periprosthetic fracture of the distal right femur.    POSTOPERATIVE DIAGNOSIS:  Comminuted periprosthetic fracture of the distal right femur.    NAME OF PROCEDURE:  Open reduction and internal fixation of the periprosthetic fracture of the right distal femur with a cable plate.    SURGEON:  Dr. Norleen Chang    ANESTHESIA:  Spinal.    INDICATIONS FOR PROCEDURE:  The patient is an 79 year old lady who had, within the last 2-3 years, a right total hip arthroplasty performed followed by exchange of the femoral component to treat a periprosthetic fracture of the femur followed by a right total knee arthroplasty.  There was only about a 4-inch segment of the femur that was not either covered by the femoral component or the hip replacement or below the long stem of the femoral component of the total hip arthroplasty.  The patient had fallen in the bathroom of a hotel and sustained a comminuted fracture between the components of the 2 joints.    DESCRIPTION OF PROCEDURE:  After a satisfactory level of spinal anesthesia had been achieved, the patient's right leg was gently scrubbed, prepped and draped in a sterile fashion.  No tourniquet was used.  An incision was made from the lateral aspect of the knee, up the lateral side of the right thigh and hemostasis was achieved using the Bovie.  The incision was carried through the very thick subcutaneous fat layer.  The iliotibial band was divided and the muscles were elevated off of the intermuscular septum to expose the fracture.  There was a high degree of comminution.  The  knee joint was not opened.  A long cable plate produced by [employer] was measured and bent to conform to the lateral femoral cortex.  The major fragments were reduced and held while the plate was fixed to the distal fragment and the proximal fragment with screws and this achieved lengthening and stabilization of the fracture.  Next, 3 cables were applied around the proximal fragment where screws could not be used because of the stem of the femoral component.  This gave good fixation to the distal fragment and 2 other cables were wrapped around the interval between the femoral stem and the femoral component of the hip arthroplasty.  The major fragments had been secured to the plate by screws.  This was reinforced by the cables, helping pull the comminuted fragments together.  For uncertain reasons, only one end of the cable could be inserted into the portion of the plate and so that the  most distal cable was fixed at one level, wrapped around the bone and inserted through one hole of the next point of attachment, where only one end of the cable could be inserted in these 2 areas.  This gave good fixation.  When it was felt that everything possible had been done to secure the plate, the knee was flexed and extended and it moved well.  Varus and valgus stress was applied and this looked stable as well.  The tourniquet was deflated and the wound was irrigated with a large quantity of sterile saline and hemostasis was achieved using the Bovie.  The iliotibial band was closed with 0-Vicryl.  The subcutaneous tissue was closed in 2 layers with 2-0 Vicryl.  The skin was closed with surgical staples.  A sterile compressive dressing was applied from the ankle to the thigh and a knee immobilizer was applied to the knee.  The patient was then gently moved back to an orthopedic bed and taken to the recovery room in satisfactory condition.  Sponge and needle counts were correct and estimated blood loss was 900 mL.      Norleen Chang,  MD      GI/xf/[id number]; D: 06/07/2013 21:40:40; T: 06/08/2013 15:27:04

## 2013-06-08 NOTE — Care Management Notes (Signed)
I have spoken with the pts son who has been advised discharge will likely be in the a.m.  He asked for a quote for his Bridget Hall to go to [facility] via ambulance and I contacted [employer] and obtained a quote of $2427.50  He was advised accordingly.  He wants to ask Dr. Margaretha Sheffield if his Bridget Hall can travel by private auto.

## 2013-06-08 NOTE — Care Plan (Signed)
 Problem: General Plan of Care(Adult,OB)  Goal: Plan of Care Review(Adult,OB)  The patient and/or their representative will communicate an understanding of their plan of care   Outcome: Ongoing (see interventions/notes)  Patient slept all night, was medicated with pain med x1  Was effective. Patient is alert with period of confusion. Call light within reach, able to make needs known. Bed alarm activated. Void in bedpan. Plan of care reviewed with patient, verbalized understanding. Will continue to monitor.    Problem: Fall/Trauma/Injury Risk (Adult, Obstetrics)  Goal: Absence of Trauma/Injury/Falls  Patient will demonstrate the desired outcomes.   Outcome: Ongoing (see interventions/notes)  Call light within reach, bed alarm activated. Round on frequently.    Problem: Fractured Hip (Adult)  Prevent and manage potential problems including:1. acute cognitive dysfunction leading to delirium/acute confusion2. acute pain3. bleeding/hematoma4. constipation5. embolism leading to tissue ischemia/infarction6. fluid imbalance7. functional deficit/self-care deficit8. infection leading to sepsis9. pneumonia10. situational response11. skin breakdown12. undernutrition13. voiding dysfunction   Goal: Potential Problems (Fractured Hip)  Signs and symptoms of listed potential problems will be absent or manageable (reference Fractured Hip (Adult) CPG)   Outcome: Ongoing (see interventions/notes)  Patient was medicated with pain meds X3 so far in this shift, for sharp pain in left hip surgical site. Sleeping at this time.

## 2013-06-08 NOTE — Care Plan (Signed)
 Problem: General Plan of Care(Adult,OB)  Goal: Plan of Care Review(Adult,OB)  The patient and/or their representative will communicate an understanding of their plan of care   Outcome: Ongoing (see interventions/notes)  Patient alert and oriented to person and situation.  She has confusion at times.  Patient without c/o pain most of the day yet when doing therapy, she does state pain.  Encouraged to use BSC.  Has been up in chair for bath with 1 person assist and right leg brace and walker.  Right leg dressing with newer bloody drainage, reinforced dressing.

## 2013-06-08 NOTE — Care Management Notes (Signed)
SS note: Bridget Hall from [facility] in [place] called ([phone]/fax [phone]). She requested several things which I have faxed to her. It sounds promising that they will be able to take [facility].  She is to call me back with confirmation after receiving the information I have faxed.   The address of the facility is [address], [place], [place].

## 2013-06-08 NOTE — Care Plan (Signed)
Problem: General Plan of Care(Adult,OB)  Goal: Plan of Care Review(Adult,OB)  The patient and/or their representative will communicate an understanding of their plan of care   Outcome: Ongoing (see interventions/notes)  Assessment: Patient was very fatigued from being up in the chair. The brace was a good fit and should work better than the knee immobilizer. Recommend continued PT. Feel she will need a SNF placement post DC. There is some concern about how she will get to [place]. If the Doctor approves of going by car I do believe we would be able to transfer her into the car with only minimal difficulty.   Plan: Continue PT per plan of care   PT Recommendations for Nursing: No further recommendations   PT Recommendations for Patient/Family:: No further recommendations   Hedy Jacob, PT

## 2013-06-08 NOTE — PT Treatment (Signed)
[facility]  [place], [place] [address]    Physical Therapy Progress Note      Subjective:  Patient was willing to work with [place].  Patient becomes quite anxious with gait attempts.  Also reported feeling as if both of her legs would give out during gait.   Numeric pain scale:  5out of 10      Areas of gait training assessed:  15  Minutes      Ambulation surface:  Level and In patient's room     Ambulation distance:  40     Ambulation ability:  Minimum assist, Moderate assist and 2 person     Assistive devices:  Rolling walker       Ambulation tolerance:  Fair     Comments: patient was able to ambulate the above distance with min assist of 2 and the FWW.  When she got about 5 feet from the bed she required mod assist of 2 to assist.  Patient became quite panicked--almost tearful. Required increased assistance as well as verbal encouragement that she could make it.  She did have difficulty moving the walker forward as the brace would catch on it and stop it from moving.    Areas of functional mobility addressed: 15 minutes          Supine - Sit:  Moderate assist and 1 person assist     Sit - Supine:  Moderate assist and 2 person assist     Sit - stand:  Moderate assist and 2 person         Balance while sitting:  Able to maintain position     Balance while standing: Unsteady - no physical help     Comments:  Patient required mod assist of 1 to get to the side of the bed.  Stood with mod assist of 2 and the FWW.  After gait she returned to bed with mod assist of 2.  Brace was left on patient in bed.    Assessment:  Patient was able to walk this afternoon with the TROM brace on.  She had difficulty moving the walker as the brace would block it.  Patient will benefit from continued PT.  Recommend a SNF stay post DC    Plan:  Continue PT per plan of care      Hedy Jacob, PT

## 2013-06-08 NOTE — Progress Notes (Signed)
Afebrile. In good spirits. Wound looks good.  Xray shows a little medial displacement of distal fragment but length and alignment is still good . I explained to Bridget Hall and  Her son that this could displace enough that more surgery would be required but I don't think that that will be necessary.  PT 13.1.     Aplace has been found in a SNF in [place] but apparently all of the necessary paper work has not been completed.  Transport by ambulance is quite expensive and we discussed the feasability of transport by her son's car and I think that this can be done safelt and comfortably. If the SNF will accept Bridget Hall, Bridget Hall will be ready to go tomorrow.

## 2013-06-08 NOTE — Progress Notes (Signed)
 [facility]  [place], [place] [address]    IP PROGRESS NOTE      Bridget Hall, Bridget Hall  Date of Admission:  05/31/2013  Date of Birth:  Jun 30, 1925  Date of Service:  06/08/2013    Chief Complaint:  Feels fine.  Subjective:  Knee pain better now.  No new complaints.      Vital Signs:  Temp (24hrs) Max:36.7 C (98.1 F)      Temperature: 36.6 C (97.9 F)  BP (Non-Invasive): 103/69 mmHg  MAP (Non-Invasive): 71 mmHG  Heart Rate: 85  Respiratory Rate: 18  Pain Score (Numeric, Faces): 6  SpO2-1: 97 %    Current Medications:    Current Facility-Administered Medications:  acetaminophen  (TYLENOL ) tablet 650 mg Oral Q6H PRN   atenolol  (TENORMIN ) tablet 50 mg Oral Daily   bisacodyl  (DULCOLAX) enteric coated tablet 10 mg Oral HS PRN   cyclobenzaprine  (FLEXERIL ) tablet 10 mg Oral 3x/day PRN   docusate sodium  (COLACE) capsule 100 mg Oral 2x/day   enoxaparin  (LOVENOX ) 40 mg/0.4 mL SubQ injection 40 mg Subcutaneous Daily   insulin  glargine (LANTUS ) 100 units/mL injection 15 Units Subcutaneous 2x/day   insulin  glulisine (APIDRA ) 100 units/mL injection 5 Units Subcutaneous 3x/day AC   morphine  4 mg/mL injection 4 mg Intravenous Q5 Min PRN   morphine  4 mg/mL injection 4 mg Intravenous Q2H PRN   nitroglycerin  (NITROSTAT ) sublingual tablet 0.4 mg Sublingual Q5 Min PRN   NS flush syringe 10 mL Intravenous Q8HRS   omeprazole  (PRILOSEC) capsule 20 mg Oral Daily before Breakfast   ondansetron  (ZOFRAN ) 2 mg/mL injection 4 mg Intravenous Q6H PRN   oxyCODONE -acetaminophen  (PERCOCET) 5-325mg  per tablet 1 Tab Oral Q4H PRN   polysaccharide iron  complex capsule 150 mg Oral Daily   travoprost  (TRAVATAN ) 0.004% ophthalmic solution 1 Drop Both Eyes NIGHTLY   warfarin (COUMADIN) tablet 7.5 mg Oral NIGHTLY       Today's Physical Exam:  General: appears in good health. No distress.   Eyes: Pupils equal and round, reactive to light and accomodation.   HENT:Head atraumatic and normocephalic   Neck: No JVD or thyromegaly or  lymphadenopathy   Lungs: Clear to auscultation bilaterally.   Cardiovascular: Iregular rate and rhythm, no murmur,   Abdomen: Soft, non-tender, Bowel sounds normal, No hepatosplenomegaly   Extremities:  no cyanosis or edema. Lt knee dressing+   Skin: Skin warm and dry   Neurologic: Grossly normal   Lymphatics: No lymphadenopathy   Psychiatric:  Mood and affect normal.        I/O:  I/O last 24 hours:      Intake/Output Summary (Last 24 hours) at 06/08/13 1333  Last data filed at 06/08/13 1000   Gross per 24 hour   Intake     60 ml   Output      0 ml   Net     60 ml     I/O current shift:  12/02 0800 - 12/02 1559  In: 60 [P.O.:60]  Out: -       Labs  Please indicate ordered or reviewed)  Reviewed:   Lab Results for Last 24 Hours:    Results for orders placed during the hospital encounter of 05/31/13 (from the past 24 hour(s))   POCT FINGERSTICK GLUCOSE       Result Value Range    BLD GLUCOSE POCT 118 (*) 60 - 100 mg/dL   POCT FINGERSTICK GLUCOSE       Result Value Range    BLD  GLUCOSE POCT 119 (*) 60 - 100 mg/dL   PT/INR       Result Value Range    PROTHROMBIN TIME 13.1 (*) 9.8 - 11.0 sec    INR NORMALIZED 1.25     POCT FINGERSTICK GLUCOSE       Result Value Range    BLD GLUCOSE POCT 97  60 - 100 mg/dL   POCT FINGERSTICK GLUCOSE       Result Value Range    BLD GLUCOSE POCT 72  60 - 100 mg/dL       Radiology Tests (Please indicate ordered or reviewed)  Reviewed: N/A    Problem List:  Active Hospital Problems   (*Primary Problem)    Diagnosis   . *Displacement of internal right knee prosthesis   . Periprosthetic fracture around internal prosthetic right hip joint   . Atrial fibrillation   . Type II or unspecified type diabetes mellitus without mention of complication, not stated as uncontrolled   . Coumadin toxicity       Assessment/ Plan:   1. Right knee pain: S/p ORIF fracture femur 11/26 by Dr. Arvell.   2. Atrial fibrillation.  Rate controlled with atenolol . INR still low. Increased coumadin dose to 7.5 mg daily  yesterday.  3. DMII: controlled with Lantus  with pre meal apidra .   4. Glaucoma. Cont travatan  eye drops.  5. Post op acute blood loss anemia. Expected. Cont iron .  Cont current care.  Patient is cleared for discharge for 30 days or less of rehab.

## 2013-06-09 LAB — PROTHROMBIN TIME, THERAPEUTIC
COUMADIN DOSE: 7.5
INR: 1.51
PROTHROMBIN TIME: 16 s — ABNORMAL HIGH (ref 9.8–11.0)
TIME OF LAST DOSE: 2134

## 2013-06-09 LAB — CBC
BASOPHILS %: 0.4 % (ref 0.0–1.4)
EOSINOPHIL #: 0.25 K/uL (ref 0.00–0.50)
EOSINOPHIL %: 3.9 % (ref 0.0–5.2)
HCT: 30.6 % — ABNORMAL LOW (ref 36.0–45.0)
HGB: 10.1 g/dL — ABNORMAL LOW (ref 12.0–15.5)
LYMPHOCYTE #: 1.57 K/uL (ref 0.70–3.20)
LYMPHOCYTE %: 24.8 % (ref 15.0–43.0)
MCH: 33.8 pg (ref 28.0–34.0)
MCHC: 33.1 g/dL (ref 33.0–37.0)
MCV: 102 fL — ABNORMAL HIGH (ref 82.0–97.0)
MONOCYTE #: 0.39 10*3/uL (ref 0.20–0.90)
MPV: 5.8 fL — ABNORMAL LOW (ref 7.0–9.4)
PLATELET COUNT: 408 K/uL (ref 150–400)
PMN #: 4.09 K/uL (ref 1.50–6.50)
PMN %: 64.7 % (ref 43.0–76.0)
RBC: 3 M/uL — ABNORMAL LOW (ref 4.00–5.10)
RDW: 12.4 % (ref 11.0–13.0)
WBC: 6.3 10*3/uL (ref 4.0–11.0)

## 2013-06-09 LAB — PERFORM POC FINGERSTICK GLUCOSE: BLD GLUCOSE POCT: 106 mg/dL — ABNORMAL HIGH (ref 60–100)

## 2013-06-09 MED ORDER — NITROGLYCERIN 0.4 MG SUBLINGUAL TABLET
0.40 mg | SUBLINGUAL_TABLET | SUBLINGUAL | Status: AC | PRN
Start: 2013-06-09 — End: ?

## 2013-06-09 MED ORDER — INSULIN GLARGINE (U-100) 100 UNIT/ML SUBCUTANEOUS SOLUTION
15.00 [IU] | Freq: Two times a day (BID) | SUBCUTANEOUS | Status: DC
Start: 2013-06-09 — End: 2013-06-09

## 2013-06-09 MED ORDER — CYCLOBENZAPRINE 10 MG TABLET
10.00 mg | ORAL_TABLET | Freq: Three times a day (TID) | ORAL | Status: AC | PRN
Start: 2013-06-09 — End: ?

## 2013-06-09 MED ORDER — DOCUSATE SODIUM 100 MG CAPSULE
100.00 mg | ORAL_CAPSULE | Freq: Two times a day (BID) | ORAL | Status: AC
Start: 2013-06-09 — End: ?

## 2013-06-09 MED ORDER — OMEPRAZOLE 20 MG CAPSULE,DELAYED RELEASE
20.00 mg | DELAYED_RELEASE_CAPSULE | Freq: Every morning | ORAL | Status: AC
Start: 2013-06-09 — End: ?

## 2013-06-09 MED ORDER — WARFARIN 7.5 MG TABLET
5.00 mg | ORAL_TABLET | Freq: Every evening | ORAL | Status: AC
Start: 2013-06-09 — End: ?

## 2013-06-09 MED ORDER — POLYSACCHARIDE IRON COMPLEX 150 MG IRON CAPSULE
150.0000 mg | ORAL_CAPSULE | Freq: Every day | ORAL | Status: AC
Start: 2013-06-09 — End: ?

## 2013-06-09 MED ORDER — OXYCODONE-ACETAMINOPHEN 5 MG-325 MG TABLET
1.00 | ORAL_TABLET | ORAL | Status: AC | PRN
Start: 2013-06-09 — End: ?

## 2013-06-09 MED ADMIN — omeprazole 20 mg capsule,delayed release: 20 mg | ORAL | NDC 00904568461

## 2013-06-09 NOTE — Care Management Notes (Signed)
SS note: Patient is discharged to [facility] in [place]. Called and left Tammie, admissions, a message. Await return call. Patient's son is transporting the patient by private auto. Orders and script have been faxed.  The facility's medical director will follow up on 06/11/13 by 4 p.m.  Patient is going to [facility] care.

## 2013-06-09 NOTE — PT Treatment (Signed)
[facility]  [place], [place] [address]    Physical Therapy Progress Note      Subjective: This pt was d/cd this am and needed assistance for car transfers . Nursing had applied the T-ROM     Areas of functional mobility addressed: 15 mins     Sit - stand:  Minimum assist, Moderate assist and 2 person     Bed - Chair/ Chair - bed:  Minimum assist, Moderate assist and 2 person     Car transfers:  Minimum assist, Moderate assist and 2 person     Balance while sitting:  Able to maintain position     Balance while standing: Partial physical support     Comments:  This pt was min/mod assistance of 2 people for sit to stand and use of the walker this am wbat wearing the T-ROM brace as ordered right leg. She then performed SPT from the bed to the chair min/mod assistance of 2 people and the walker. She was wheeled downstairs to her car and placed in the backseat per pt's preference needing assistance of 2 people for sit to stand and SPT from the chair to car . Assistance of 2 people was needed to get this pt positioned in the backseat for comfort .      Assessment:  Assistance was given assistance this date of 2 people for all bed mobility and transfers using the walker wbat on her right leg . She needed assistance of 2 people to get into the car this am .     Plan:  D/cd from our services   .Joice Lofts, PTA

## 2013-06-09 NOTE — Care Plan (Signed)
 Problem: General Plan of Care(Adult,OB)  Goal: Plan of Care Review(Adult,OB)  The patient and/or their representative will communicate an understanding of their plan of care   Outcome: Ongoing (see interventions/notes)  Patient is in bed, completed her bath.  She offers little complaints until she is moving out of bed etc.  States pain is #8/10.  Intermittent confusion noted, pleasant and cooperative.  Right leg brace on, dressing dry and intact.  Tech and nursing student placing foley catheter prior to transport to the nursing facility in [place] .

## 2013-06-09 NOTE — Nurses Notes (Signed)
 Report given to Minitra Downer at [facility].  She is to go to [address] [facility].  Their # is [phone].  Patient placed in son's vehicle with assistance of Physical Therapy and Tech.

## 2013-06-09 NOTE — CDI REVIEW (Signed)
[facility] - Follow Up Review     Working DRG:  481  Dx:  Displaced Knee Prosthesis [phone], Afib [id number], Coumadin Tox [id number], DM 25000. Fx [phone], PostOp Blood Loss Anemia 2851    Proc:  ORIF Femur [id number]

## 2013-06-09 NOTE — Discharge Instructions (Signed)
FULL Code        Your A1C is 5.6 = 114 Average Blood Sugar, Normal A1C is 7.0 or below  Type 2 Diabetes Mellitus, Adult  Type 2 diabetes mellitus, often simply referred to as type 2 diabetes, is a long-lasting (chronic) disease. In type 2 diabetes, the pancreas does not make enough insulin (a hormone), the cells are less responsive to the insulin that is made (insulin resistance), or both. Normally, insulin moves sugars from food into the tissue cells. The tissue cells use the sugars for energy. The lack of insulin or the lack of normal response to insulin causes excess sugars to build up in the blood instead of going into the tissue cells. As a result, high blood sugar (hyperglycemia) develops. The effect of high sugar (glucose) levels can cause many complications.  Type 2 diabetes was also previously called adult-onset diabetes but it can occur at any age.   RISK FACTORS   A person is predisposed to developing type 2 diabetes if someone in the family has the disease and also has one or more of the following primary risk factors:   Overweight.   An inactive lifestyle.   A history of consistently eating high-calorie foods.  Maintaining a normal weight and regular physical activity can reduce the chance of developing type 2 diabetes.  SYMPTOMS   A person with type 2 diabetes may not show symptoms initially. The symptoms of type 2 diabetes appear slowly. The symptoms include:   Increased thirst (polydipsia).   Increased urination (polyuria).   Increased urination during the night (nocturia).   Weight loss. This weight loss may be rapid.   Frequent, recurring infections.   Tiredness (fatigue).   Weakness.   Vision changes, such as blurred vision.   Fruity smell to your breath.   Abdominal pain.   Nausea or vomiting.   Cuts or bruises which are slow to heal.   Tingling or numbness in the hands or feet.  DIAGNOSIS  Type 2 diabetes is frequently not diagnosed until complications of diabetes are present.  Type 2 diabetes is diagnosed when symptoms or complications are present and when blood glucose levels are increased. Your blood glucose level may be checked by one or more of the following blood tests:   A fasting blood glucose test. You will not be allowed to eat for at least 8 hours before a blood sample is taken.   A random blood glucose test. Your blood glucose is checked at any time of the day regardless of when you ate.   A hemoglobin A1c blood glucose test. A hemoglobin A1c test provides information about blood glucose control over the previous 3 months.   An oral glucose tolerance test (OGTT). Your blood glucose is measured after you have not eaten (fasted) for 2 hours and then after you drink a glucose-containing beverage.  TREATMENT    You may need to take insulin or diabetes medicine daily to keep blood glucose levels in the desired range.   You will need to match insulin dosing with exercise and healthy food choices.  The treatment goal is to maintain the before meal blood sugar (preprandial glucose) level at 70 130 mg/dL.  HOME CARE INSTRUCTIONS    Have your hemoglobin A1c level checked twice a year.   Perform daily blood glucose monitoring as directed by your caregiver.   Monitor urine ketones when you are ill and as directed by your caregiver.   Take your diabetes medicine or insulin as  directed by your caregiver to maintain your blood glucose levels in the desired range.   Never run out of diabetes medicine or insulin. It is needed every day.   Adjust insulin based on your intake of carbohydrates. Carbohydrates can raise blood glucose levels but need to be included in your diet. Carbohydrates provide vitamins, minerals, and fiber which are an essential part of a healthy diet. Carbohydrates are found in fruits, vegetables, whole grains, dairy products, legumes, and foods containing added sugars.      Eat healthy foods. Alternate 3 meals with 3 snacks.   Lose weight if  overweight.   Carry a medical alert card or wear your medical alert jewelry.   Carry a 15 gram carbohydrate snack with you at all times to treat low blood glucose (hypoglycemia). Some examples of 15 gram carbohydrate snacks include:   Glucose tablets, 3 or 4     Glucose gel, 15 gram tube   Raisins, 2 tablespoons (24 grams)   Jelly beans, 6   Animal crackers, 8   Regular pop, 4 ounces (120 mL)   Gummy treats, 9   Recognize hypoglycemia. Hypoglycemia occurs with blood glucose levels of 70 mg/dL and below. The risk for hypoglycemia increases when fasting or skipping meals, during or after intense exercise, and during sleep. Hypoglycemia symptoms can include:   Tremors or shakes.   Decreased ability to concentrate.   Sweating.   Increased heart rate.   Headache.   Dry mouth.   Hunger.   Irritability.   Anxiety.   Restless sleep.   Altered speech or coordination.   Confusion.   Treat hypoglycemia promptly. If you are alert and able to safely swallow, follow the 15:15 rule:   Take 15 20 grams of rapid-acting glucose or carbohydrate. Rapid-acting options include glucose gel, glucose tablets, or 4 ounces (120 mL) of fruit juice, regular soda, or low fat milk.   Check your blood glucose level 15 minutes after taking the glucose.   Take 15 20 grams more of glucose if the repeat blood glucose level is still 70 mg/dL or below.   Eat a meal or snack within 1 hour once blood glucose levels return to normal.      Be alert to polyuria and polydipsia which are early signs of hyperglycemia. An early awareness of hyperglycemia allows for prompt treatment. Treat hyperglycemia as directed by your caregiver.   Engage in at least 150 minutes of moderate-intensity physical activity a week, spread over at least 3 days of the week or as directed by your caregiver. In addition, you should engage in resistance exercise at least 2 times a week or as directed by your caregiver.   Adjust your medicine and food  intake as needed if you start a new exercise or sport.   Follow your sick day plan at any time you are unable to eat or drink as usual.   Avoid tobacco use.   Limit alcohol intake to no more than 1 drink per day for nonpregnant women and 2 drinks per day for men. You should drink alcohol only when you are also eating food. Talk with your caregiver whether alcohol is safe for you. Tell your caregiver if you drink alcohol several times a week.   Follow up with your caregiver regularly.   Schedule an eye exam soon after the diagnosis of type 2 diabetes and then annually.   Perform daily skin and foot care. Examine your skin and feet daily for cuts, bruises, redness,  nail problems, bleeding, blisters, or sores. A foot exam by a caregiver should be done annually.   Brush your teeth and gums at least twice a day and floss at least once a day. Follow up with your dentist regularly.   Share your diabetes management plan with your workplace or school.   Stay up-to-date with immunizations.   Learn to manage stress.   Obtain ongoing diabetes education and support as needed.   Participate in, or seek rehabilitation as needed to maintain or improve independence and quality of life. Request a physical or occupational therapy referral if you are having foot or hand numbness or difficulties with grooming, dressing, eating, or physical activity.  SEEK MEDICAL CARE IF:    You are unable to eat food or drink fluids for more than 6 hours.   You have nausea and vomiting for more than 6 hours.   Your blood glucose level is over 240 mg/dL.   There is a change in mental status.   You develop an additional serious illness.   You have diarrhea for more than 6 hours.   You have been sick or have had a fever for a couple of days and are not getting better.   You have pain during any physical activity.   SEEK IMMEDIATE MEDICAL CARE IF:   You have difficulty breathing.   You have moderate to large ketone levels.  MAKE SURE  YOU:   Understand these instructions.   Will watch your condition.   Will get help right away if you are not doing well or get worse.  Document Released: 06/24/2005 Document Revised: 03/18/2012 Document Reviewed: 01/21/2012  [facility] Patient Information 2014 [place], [place].

## 2013-06-09 NOTE — Care Plan (Signed)
Problem: General Plan of Care(Adult,OB)  Goal: Plan of Care Review(Adult,OB)  The patient and/or their representative will communicate an understanding of their plan of care   Outcome: Ongoing (see interventions/notes)  Patient is sleeping, no discomfort noted at his time. Plan of care reviewed with patient, verbalized understanding. Voids in bedpan. Brace remains in right knee. Round on frequently, will continue to monitor.    Problem: Fall/Trauma/Injury Risk (Adult, Obstetrics)  Goal: Absence of Trauma/Injury/Falls  Patient will demonstrate the desired outcomes.   Outcome: Ongoing (see interventions/notes)  Patient is able to make needs known, call light within reach. Bed alarm activated.    Problem: Fractured Hip (Adult)  Prevent and manage potential problems including:1. acute cognitive dysfunction leading to delirium/acute confusion2. acute pain3. bleeding/hematoma4. constipation5. embolism leading to tissue ischemia/infarction6. fluid imbalance7. functional deficit/self-care deficit8. infection leading to sepsis9. pneumonia10. situational response11. skin breakdown12. undernutrition13. voiding dysfunction   Goal: Potential Problems (Fractured Hip)  Signs and symptoms of listed potential problems will be absent or manageable (reference Fractured Hip (Adult) CPG)   Outcome: Ongoing (see interventions/notes)  Dressing in intact right knee. Brace remains in place.    Problem: Pressure Ulcer Risk (Using Braden Scale) (Adult, Obstetrics, Pediatric)  Goal: Skin Integrity  Patient will demonstrate the desired outcomes.   Outcome: Ongoing (see interventions/notes)  Patient is sleeping, able to shift position in bed.

## 2013-06-09 NOTE — Progress Notes (Signed)
[facility]  [place], [place] [address]    IP PROGRESS NOTE      Bridget Hall, Bridget Hall  Date of Admission:  05/31/2013  Date of Birth:  1925-06-24  Date of Service:  06/09/2013    Chief Complaint:  Feels fine.  Subjective:  Knee pain better.  I am ready to go [place].  No new complaints.      Vital Signs:  Temp (24hrs) Max:36.6 C (97.9 F)      Temperature: 36.3 C (97.3 F)  BP (Non-Invasive): 92/60 mmHg  MAP (Non-Invasive): 71 mmHG  Heart Rate: 93  Respiratory Rate: 18  Pain Score (Numeric, Faces): 6  SpO2-1: 96 %    Current Medications:    Current Facility-Administered Medications:  acetaminophen (TYLENOL) tablet 650 mg Oral Q6H PRN   atenolol (TENORMIN) tablet 50 mg Oral Daily   bisacodyl (DULCOLAX) enteric coated tablet 10 mg Oral HS PRN   cyclobenzaprine (FLEXERIL) tablet 10 mg Oral 3x/day PRN   docusate sodium (COLACE) capsule 100 mg Oral 2x/day   enoxaparin (LOVENOX) 40 mg/0.4 mL SubQ injection 40 mg Subcutaneous Daily   insulin glargine (LANTUS) 100 units/mL injection 15 Units Subcutaneous 2x/day   insulin glulisine (APIDRA) 100 units/mL injection 5 Units Subcutaneous 3x/day AC   morphine 4 mg/mL injection 4 mg Intravenous Q5 Min PRN   morphine 4 mg/mL injection 4 mg Intravenous Q2H PRN   nitroglycerin (NITROSTAT) sublingual tablet 0.4 mg Sublingual Q5 Min PRN   NS flush syringe 10 mL Intravenous Q8HRS   omeprazole (PRILOSEC) capsule 20 mg Oral Daily before Breakfast   ondansetron (ZOFRAN) 2 mg/mL injection 4 mg Intravenous Q6H PRN   oxyCODONE-acetaminophen (PERCOCET) 5-325mg  per tablet 1 Tab Oral Q4H PRN   polysaccharide iron complex capsule 150 mg Oral Daily   travoprost (TRAVATAN) 0.004% ophthalmic solution 1 Drop Both Eyes NIGHTLY   warfarin (COUMADIN) tablet 7.5 mg Oral NIGHTLY       Today's Physical Exam:  General: appears in good health. No distress.   Eyes: Pupils equal and round, reactive to light and accomodation.   HENT:Head atraumatic and normocephalic   Neck: No  JVD or thyromegaly or lymphadenopathy   Lungs: Clear to auscultation bilaterally.   Cardiovascular: Iregular rate and rhythm, no murmur,   Abdomen: Soft, non-tender, Bowel sounds normal, No hepatosplenomegaly   Extremities:  no cyanosis or edema. Lt knee dressing+   Skin: Skin warm and dry   Neurologic: Grossly normal   Lymphatics: No lymphadenopathy   Psychiatric:  Mood and affect normal.        I/O:  I/O last 24 hours:      Intake/Output Summary (Last 24 hours) at 06/09/13 1037  Last data filed at 06/09/13 0900   Gross per 24 hour   Intake    440 ml   Output      0 ml   Net    440 ml     I/O current shift:  12/03 0800 - 12/03 1559  In: 120 [P.O.:120]  Out: -       Labs  Please indicate ordered or reviewed)  Reviewed:   Lab Results for Last 24 Hours:    Results for orders placed during the hospital encounter of 05/31/13 (from the past 24 hour(s))   POCT FINGERSTICK GLUCOSE       Result Value Range    BLD GLUCOSE POCT 72  60 - 100 mg/dL   POCT FINGERSTICK GLUCOSE       Result Value  Range    BLD GLUCOSE POCT 112 (*) 60 - 100 mg/dL   POCT FINGERSTICK GLUCOSE       Result Value Range    BLD GLUCOSE POCT 130 (*) 60 - 100 mg/dL   CBC       Result Value Range    WBC 6.3  4.0 - 11.0 K/uL    RBC 3.00 (*) 4.00 - 5.10 M/uL    HGB 10.1 (*) 12.0 - 15.5 g/dL    HCT 16.1 (*) 09.6 - 45.0 %    MCV 102.0 (*) 82.0 - 97.0 fL    MCH 33.8  28.0 - 34.0 pg    MCHC 33.1  33.0 - 37.0 g/dL    RDW 04.5  40.9 - 81.1 %    PLATELET COUNT 408 (*) 150 - 400 K/uL    MPV 5.8 (*) 7.0 - 9.4 fL    PMN % 64.7  43.0 - 76.0 %    LYMPHOCYTE % 24.8  15.0 - 43.0 %    MONOCYTE % 6.2  4.8 - 12.0 %    EOSINOPHIL % 3.9  0.0 - 5.2 %    BASOPHILS % 0.4  0.0 - 1.4 %    PMN # 4.09  1.50 - 6.50 K/uL    LYMPHOCYTE # 1.57  0.70 - 3.20 K/uL    MONOCYTE # 0.39  0.20 - 0.90 K/uL    EOSINOPHIL # 0.25  0.00 - 0.50 K/uL    BASOPHIL # 0.03  0.00 - 0.10 K/uL   PROTHROMBIN TIME, THERAPEUTIC - [facility] ONLY       Result Value Range    PROTHROMBIN TIME 16.0 (*) 9.8 - 11.0 sec     INR 1.51      COUMADIN DOSE 7.5      COUMADIN-LAST DOSE DATE [id number]      TIME OF LAST DOSE 2134     POCT FINGERSTICK GLUCOSE       Result Value Range    BLD GLUCOSE POCT 106 (*) 60 - 100 mg/dL       Radiology Tests (Please indicate ordered or reviewed)  Reviewed: N/A    Problem List:  Active Hospital Problems   (*Primary Problem)    Diagnosis    *Displacement of internal right knee prosthesis    Periprosthetic fracture around internal prosthetic right hip joint    Atrial fibrillation    Type II or unspecified type diabetes mellitus without mention of complication, not stated as uncontrolled    Coumadin toxicity       Assessment/ Plan:   1. Right knee pain: S/p ORIF fracture femur 11/26 by Dr. Margaretha Sheffield.   2. Atrial fibrillation.  Rate controlled with atenolol. INR improved to 1.5 cont coumadin.  3. DMII: controlled with Lantus with pre meal apidra. Stop Lantus. Restart glipizide.  4. Glaucoma. Cont travatan eye drops.  5. Post op acute blood loss anemia. Expected. Cont iron.  Patient is cleared for discharge for 30 days or less of rehab.  Medically stable for discharge.

## 2013-06-09 NOTE — Nurses Notes (Signed)
 Pt. home meds was returned to pt.'s son.

## 2013-06-09 NOTE — Care Plan (Signed)
Problem: General Plan of Care(Adult,OB)  Goal: Plan of Care Review(Adult,OB)  The patient and/or their representative will communicate an understanding of their plan of care   Outcome: Ongoing (see interventions/notes)  Patient alert and oriented. Patient was aware of everything going on around her and the situation. Patient expresses anxiety/ excitement about her trip to go to [facility] tomorrow with her son. Patient without c/o pain most of the shift, however pt requested pain medication for sleeping. Medicated x1 (see mar) for pain. Patient has been resting comfortably this shift. Patient was  up in chair for bath with 1 person assist and right leg brace and walker. Right leg dressing intact with no drainage at this time. PT is neurovascularly intact. Patient has no questions at this time.Patient instructed to call  for assistance when needed. Call bell within reach, will continue to monitor

## 2013-06-09 NOTE — Discharge Summary (Signed)
[facility]                               [place], [place] [address]                                     [phone]                                    DISCHARGE SUMMARY    PATIENT NAME: Bridget Hall, Bridget Hall [facility] BJYNWG:[id number]  DATE OF BIRTH: Sep 03, 1924    ADMISSION DATE:05/31/2013  DISCHARGE DATE:06/09/2013    ADMISSION DIAGNOSIS:  Severely comminuted periprosthetic fracture of the right femur between a total hip arthroplasty and total knee arthroplasty.    DISCHARGE DIAGNOSIS:  Severely comminuted periprosthetic fracture of the right femur between a total hip arthroplasty and total knee arthroplasty.    SECONDARY DIAGNOSES:  1.  Atrial fibrillation, anticoagulated on Coumadin.  2.  Type 2 diabetes mellitus.  3.  History of cerebrovascular accident weakening the right side.  4.  Hypertension.  5.  Postoperative blood loss anemia.    REASON FOR ADMISSION:  The patient is an 79 year old lady who has undergone a right total hip arthroplasty and bilateral total knee arthroplasties in the last 2 or 3 years and has also had a cerebrovascular accident, which is weakened her right side.  She had a periprosthetic fracture of the femur following the right total hip arthroplasty and this was revised with a femoral component with a very long stem.  She was traveling with her son from [place] to visit another son in [place] when she slipped in the hotel bathroom and experienced severe pain in the right distal thigh.  She was seen in the Emergency Department at [facility] and found to have a comminuted periprosthetic fracture of the distal femur between the stem of the hip replacement and the femoral component of the total knee arthroplasty.    PHYSICAL EXAMINATION:  She is in remarkably good spirits.  She is comfortable with Buck's traction applied and the right leg and the right foot is neurovascularly intact.   The skin is intact, but there is obvious false motion just above the knee.    IMAGING:  X-rays shows that the components of her 2 total joints are still well fixed but there is a comminuted fracture between the 2.    PROCEDURES PERFORMED:  June 02, 2013, by Dr. Margaretha Sheffield; open reduction and internal fixation of periprosthetic fracture of right distal femur.    HOSPITAL COURSE AND TREATMENT:  The patient was admitted through the Emergency Department on the night of May 31, 2013.  She was evaluated by Dr. Gay Filler and he and Dr. Ramiro Harvest and the other hospitalist followed her for her medical problems throughout her hospitalization.  Her prothrombin time was 77 and consultation with anesthesiologist was made and they felt that she needed to have a spinal anesthetic and they did not feel that even with a general anesthetic, it would be wise for her to undergo anesthesia and surgery with a  high prothrombin time.  Her pain was controlled and the situation was discussed at length with her son who stayed in town with her throughout the hospitalization.  She was given vitamin K and by June 02, 2013, her INR was less than 1.5 and she was cleared for surgery.  She underwent open reduction and internal fixation of the comminuted periprosthetic fracture with a cable plate under spinal anesthesia.  The bone was soft but solid fixation was achieved and she tolerated the procedure fairly well in spite of 900 mL blood loss.  She was initially immobilized in a bulky splint and knee immobilizer and her Coumadin was restarted immediately after surgery and she was also covered with Lovenox.  She was seen by physical therapy on the second postoperative day with Thanksgiving intervening.  She had difficulty at first but was eventually able to take a few steps with a walker, touchdown gait on the right.  The hospitalist followed her for her medical problems and the diabetes and hypertension remained under good control.  Her  hematocrit dropped below 30, but she was not symptomatic and this was felt due to postoperative blood loss anemia and it gradually started to come up to 30.6 on the day of discharge.  She was evaluated by case management and social services and discussion with the family indicated that it would be best for her to go to a skilled nursing facility near her son in [place] and plans were made for this.  Her prothrombin time was very slow to respond after the Coumadin was restarted but it did come up to 16 on the day of discharge.  Her atrial fibrillation rate was controlled with atenolol and her blood sugars were controlled with Lantus insulin.  Her wound looked good and she was fitted for a T-ROM brace  to make walking easier and she was also placed on a CPM so that her knee would not become too stiff.  X-rays done on June 08, 2013, after a few days of weightbearing showed a slight medial shift of the distal fragment but it was felt that this would impact and the fixation was still holding it to allow it to heal satisfactorily but her son was warned that this could be a problem requiring further treatment in the future.  Arrangements were finally completed for her to go to a nursing home in [place] and her wound looked good and after discussing all the pros and cons, her son was going to transfer her by private vehicle.    CONDITION UPON DISCHARGE:  Satisfactory.    DISPOSITION:  The patient was transferred by private vehicle to a skilled nursing facility in [place].  There she will be on a diabetic diet and she will be seen by physical therapy and occupational therapy.  She will take Percocet 5/325 for pain and she had some of these to take on the trip to [place].    MEDICATIONS:  She will continue her regular medicines which are:  1.  Atenolol 50 milligrams daily.  2.  Flexeril 10 milligrams three times a day as required for muscle spasm.  3.  Colace 100 milligrams twice a day.  4.   Lantus insulin 15 units twice a day in the morning and at bedtime.  5.  Apidra insulin 5 units three times a day before meals.  6.  Omeprazole 20 milligrams every morning.  7.  Polysaccharide iron complex 150 milligrams daily.  8.  Travatan eyedrops 1  drop in both eyes nightly.  9.  She will continue on Coumadin 5 milligrams every evening.    Prothrombin times will be followed where she is going.  I will check with her family to see how she is doing and I told them to feel free to call me if there are any questions or for the physicians where she is going to call me if there are any questions.  She will be discharged with copies of her medical records and disks with her x-rays.      Adria Dill, MD      ZO/[id number]; D: 06/09/2013 98:11:91; T: 06/09/2013 09:38:59    cc: Rise Patience MD      [address]       [place], [place] [address]

## 2013-06-13 ENCOUNTER — Emergency Department (HOSPITAL_COMMUNITY)
Admission: EM | Admit: 2013-06-13 | Discharge: 2013-06-13 | Disposition: A | Discharge status: Skilled Nursing Facility | Payer: Medicare Other | Source: Home / Self Care | Attending: Emergency Medicine | Admitting: Emergency Medicine

## 2013-06-13 ENCOUNTER — Encounter (HOSPITAL_COMMUNITY): Payer: Self-pay | Admitting: Emergency Medicine

## 2013-06-13 DIAGNOSIS — E119 Type 2 diabetes mellitus without complications: Secondary | ICD-10-CM | POA: Insufficient documentation

## 2013-06-13 DIAGNOSIS — N390 Urinary tract infection, site not specified: Secondary | ICD-10-CM | POA: Insufficient documentation

## 2013-06-13 DIAGNOSIS — I10 Essential (primary) hypertension: Secondary | ICD-10-CM | POA: Insufficient documentation

## 2013-06-13 DIAGNOSIS — L0390 Cellulitis, unspecified: Secondary | ICD-10-CM

## 2013-06-13 DIAGNOSIS — T8140XA Infection following a procedure, unspecified, initial encounter: Secondary | ICD-10-CM | POA: Insufficient documentation

## 2013-06-13 DIAGNOSIS — Z8673 Personal history of transient ischemic attack (TIA), and cerebral infarction without residual deficits: Secondary | ICD-10-CM | POA: Insufficient documentation

## 2013-06-13 DIAGNOSIS — Y838 Other surgical procedures as the cause of abnormal reaction of the patient, or of later complication, without mention of misadventure at the time of the procedure: Secondary | ICD-10-CM | POA: Insufficient documentation

## 2013-06-13 HISTORY — DX: Essential (primary) hypertension: I10

## 2013-06-13 HISTORY — DX: Type 2 diabetes mellitus without complications: E11.9

## 2013-06-13 HISTORY — DX: Unspecified atrial fibrillation: I48.91

## 2013-06-13 HISTORY — DX: Cerebral infarction, unspecified: I63.9

## 2013-06-13 LAB — URINALYSIS, ROUTINE W REFLEX MICROSCOPIC
Glucose, UA: NEGATIVE mg/dL
Ketones, ur: NEGATIVE mg/dL
Nitrite: NEGATIVE
pH: 6 (ref 5.0–8.0)

## 2013-06-13 LAB — CBC WITH DIFFERENTIAL/PLATELET
Basophils Relative: 0 % (ref 0–1)
Eosinophils Relative: 3 % (ref 0–5)
HCT: 35.8 % — ABNORMAL LOW (ref 36.0–46.0)
Hemoglobin: 11.3 g/dL — ABNORMAL LOW (ref 12.0–15.0)
Lymphocytes Relative: 17 % (ref 12–46)
Lymphs Abs: 1.4 10*3/uL (ref 0.7–4.0)
MCV: 100.3 fL — ABNORMAL HIGH (ref 78.0–100.0)
Monocytes Absolute: 0.5 10*3/uL (ref 0.1–1.0)
Monocytes Relative: 5 % (ref 3–12)
Neutro Abs: 6.3 10*3/uL (ref 1.7–7.7)
WBC: 8.4 10*3/uL (ref 4.0–10.5)

## 2013-06-13 LAB — BASIC METABOLIC PANEL
BUN: 19 mg/dL (ref 6–23)
CO2: 29 mEq/L (ref 19–32)
Calcium: 9 mg/dL (ref 8.4–10.5)
Chloride: 109 mEq/L (ref 96–112)
Creatinine, Ser: 0.8 mg/dL (ref 0.50–1.10)
Glucose, Bld: 84 mg/dL (ref 70–99)

## 2013-06-13 LAB — URINE MICROSCOPIC-ADD ON

## 2013-06-13 MED ORDER — CEPHALEXIN 500 MG PO CAPS: 500.0000 mg | ORAL_CAPSULE | Freq: Four times a day (QID) | ORAL | Status: DC

## 2013-06-13 NOTE — ED Notes (Signed)
Pt had ORIF of fracture of right distal femur on Nov. 26th. Pt has been at rehab facility [facility]) recovering with immobilizer brace. Pt states she thinks brace has been rubbing staples and presents today with oozing blood from surgical site and knee.

## 2013-06-13 NOTE — ED Provider Notes (Signed)
CSN: [id number]     Arrival date & time 06/13/13  1241 History   First MD Initiated Contact with Patient 06/13/13 1254     Chief Complaint  Patient presents with  . Post-op Problem   (Consider location/radiation/quality/duration/timing/severity/associated sxs/prior Treatment) HPI Comments: Patient with a history of ORIF of femur performed in [place] on 06/02/13 presents today with erythema and drainage at the incision site.  She comes from [facility] facility.  Family reports that the incision has been draining clear fluid since the surgery.  However, according to the family when the physical therapist worked with the patient today she noticed some erythema over the distal portion of the incision.  No purulent drainage.  She denies any increase in pain.  Patient denies fever, chills, nausea, or vomiting.  Denies numbness or tingling.  She is currently not on any antibiotics.  The history is provided by the patient.    Past Medical History  Diagnosis Date  . Hypertension   . Diabetes mellitus without complication   . Stroke   . A-fib    Past Surgical History  Procedure Laterality Date  . Joint replacement     History reviewed. No pertinent family history. History  Substance Use Topics  . Smoking status: Never Smoker   . Smokeless tobacco: Not on file  . Alcohol Use: No   OB History   Grav Para Term Preterm Abortions TAB SAB Ect Mult Living                 Review of Systems  All other systems reviewed and are negative.    Allergies  Review of patient's allergies indicates no known allergies.  Home Medications  No current outpatient prescriptions on file. BP 128/76  Temp(Src) 97.5 F (36.4 C)  Resp 16  Ht 5\' 7"  (1.702 m)  SpO2 97% Physical Exam  Nursing note and vitals reviewed. Constitutional: She appears well-developed and well-nourished.  HENT:  Head: Normocephalic and atraumatic.  Mouth/Throat: Oropharynx is clear and moist.  Neck:  Normal range of motion. Neck supple.  Cardiovascular: Normal rate, regular rhythm and normal heart sounds.   Pulses:      Dorsalis pedis pulses are 2+ on the right side, and 2+ on the left side.  Pulmonary/Chest: Effort normal and breath sounds normal.  Musculoskeletal: Normal range of motion.       Right knee: She exhibits no swelling, no effusion and no erythema. No tenderness found.  Neurological: She is alert.  Distal sensation of the right foot is intact.  Skin: Skin is warm and dry.  Patient with an incision of the right lateral upper leg.  Staples intact.  Skin well approximated.  No wound dehiscence.  Distal portion of the incision with a small amount of erythema, warmth, and induration.  No active drainage at this time.  No erythematous streaking.    Psychiatric: She has a normal mood and affect.    ED Course  Procedures (including critical care time) Labs Review Labs Reviewed  CBC WITH DIFFERENTIAL  BASIC METABOLIC PANEL   Imaging Review No results found.  EKG Interpretation   None      Patient discussed with Dr. Karma Ganja. MDM  No diagnosis found. Patient with recent ORIF surgery of the right femur performed on 06/02/13 presents today due to erythema over the distal portion of her incision.  Patient is afebrile.  Normal WBC.  Labs unremarkable.  UA showing UTI.  Patient started on Keflex to treat both  UTI and Cellulitis.  Patient stable for discharge.  Return precautions given,.    Santiago Glad, PA-C 06/15/13 1027

## 2013-06-13 NOTE — ED Notes (Signed)
Pt clothes put back on along with leg brace.

## 2013-06-13 NOTE — ED Notes (Signed)
Pt was brought by [facility]. Vitals signs per EMS BP 110/78 HR 70.

## 2013-06-13 NOTE — ED Notes (Signed)
[facility] called to transport patient  

## 2013-06-14 ENCOUNTER — Encounter: Payer: Self-pay | Admitting: Internal Medicine

## 2013-06-14 ENCOUNTER — Non-Acute Institutional Stay (SKILLED_NURSING_FACILITY): Payer: Medicare Other | Admitting: Internal Medicine

## 2013-06-14 DIAGNOSIS — D62 Acute posthemorrhagic anemia: Secondary | ICD-10-CM

## 2013-06-14 DIAGNOSIS — Z7901 Long term (current) use of anticoagulants: Secondary | ICD-10-CM

## 2013-06-14 DIAGNOSIS — N390 Urinary tract infection, site not specified: Secondary | ICD-10-CM

## 2013-06-14 DIAGNOSIS — L0390 Cellulitis, unspecified: Secondary | ICD-10-CM

## 2013-06-14 DIAGNOSIS — I4891 Unspecified atrial fibrillation: Secondary | ICD-10-CM

## 2013-06-14 DIAGNOSIS — S72301S Unspecified fracture of shaft of right femur, sequela: Secondary | ICD-10-CM

## 2013-06-14 DIAGNOSIS — I6350 Cerebral infarction due to unspecified occlusion or stenosis of unspecified cerebral artery: Secondary | ICD-10-CM

## 2013-06-14 DIAGNOSIS — S8290XS Unspecified fracture of unspecified lower leg, sequela: Secondary | ICD-10-CM

## 2013-06-14 DIAGNOSIS — K219 Gastro-esophageal reflux disease without esophagitis: Secondary | ICD-10-CM

## 2013-06-14 DIAGNOSIS — E119 Type 2 diabetes mellitus without complications: Secondary | ICD-10-CM

## 2013-06-14 DIAGNOSIS — L0291 Cutaneous abscess, unspecified: Secondary | ICD-10-CM

## 2013-06-14 DIAGNOSIS — I639 Cerebral infarction, unspecified: Secondary | ICD-10-CM

## 2013-06-14 NOTE — Progress Notes (Signed)
Patient ID: Tammy Sharp, female   DOB: 09-29-1924, 79 y.o.   MRN: [id number]     [facility]  PCP: Pcp Not In System  Code Status: full code  No Known Allergies  Chief Complaint: new admit  HPI:  79 y/o female patient is here after hospital admission from 05/31/13- 06/09/13 with severely comminuted periprosthetic fracture of right femur between total hip arthroplasty and total knee arthroplasty. She is here for STR and goal is to return home. She is working with therapy team and her pain is under control. She denies any complaints this visit She has hx of afib, CVA with right sided weakness, dm type 2 and HTN She was seen in the ED yesterday for cellulitis in her leg at incision area and uti and started on keflex  Review of Systems  Constitutional: Negative for fever, chills, weight loss, malaise/fatigue and diaphoresis.  HENT: Negative for congestion, hearing loss and sore throat.   Eyes: Negative for blurred vision, double vision and discharge.  Respiratory: Negative for cough, sputum production, shortness of breath and wheezing.   Cardiovascular: Negative for chest pain, palpitations, orthopnea and leg swelling.  Gastrointestinal: Negative for heartburn, nausea, vomiting, abdominal pain, diarrhea and constipation.  Genitourinary: Negative for dysuria, urgency, frequency and flank pain.  Musculoskeletal: Negative for back pain, falls, joint pain and myalgias.  Skin: Negative for itching and rash.  Neurological: Positive for weakness. Negative for dizziness, tingling, focal weakness and headaches.  Psychiatric/Behavioral: Negative for depression and memory loss. The patient is not nervous/anxious.     Past Medical History  Diagnosis Date  . Hypertension   . Diabetes mellitus without complication   . Stroke   . A-fib    Past Surgical History  Procedure Laterality Date  . Joint replacement     Social History:   reports that she has never smoked. She does not  have any smokeless tobacco history on file. She reports that she does not drink alcohol or use illicit drugs.  No family history on file.  Medications: Patient's Medications  New Prescriptions   No medications on file  Previous Medications   ACETAMINOPHEN (TYLENOL) 650 MG CR TABLET    Take 1,300 mg by mouth every 8 (eight) hours as needed for pain.   ATENOLOL (TENORMIN) 50 MG TABLET    Take 50 mg by mouth daily.   CEPHALEXIN (KEFLEX) 500 MG CAPSULE    Take 1 capsule (500 mg total) by mouth 4 (four) times daily.   CYCLOBENZAPRINE (FLEXERIL) 10 MG TABLET    Take 10 mg by mouth 3 (three) times daily as needed for muscle spasms.   DOCUSATE SODIUM (COLACE) 100 MG CAPSULE    Take 100 mg by mouth 2 (two) times daily.   ENOXAPARIN (LOVENOX) 40 MG/0.4ML INJECTION    Inject 40 mg into the skin daily. D/c when INR 2-3 therapeutic   INSULIN DETEMIR (LEVEMIR) 100 UNIT/ML INJECTION    Inject 15 Units into the skin 2 (two) times daily.   INSULIN GLULISINE (APIDRA) 100 UNIT/ML INJECTION    Inject 5 Units into the skin 3 (three) times daily before meals.   IRON POLYSACCHARIDES (NIFEREX) 150 MG CAPSULE    Take 150 mg by mouth daily.   LATANOPROST (XALATAN) 0.005 % OPHTHALMIC SOLUTION    Place 1 drop into both eyes at bedtime.   METHOCARBAMOL (ROBAXIN) 500 MG TABLET    Take 500 mg by mouth 3 (three) times daily as needed for muscle spasms.  NITROGLYCERIN (NITROSTAT) 0.4 MG SL TABLET    Place 0.4 mg under the tongue every 5 (five) minutes as needed for chest pain.   OMEPRAZOLE (PRILOSEC) 20 MG CAPSULE    Take 20 mg by mouth daily.   OXYCODONE-ACETAMINOPHEN (PERCOCET/ROXICET) 5-325 MG PER TABLET    Take 1 tablet by mouth every 4 (four) hours as needed for severe pain.   WARFARIN (COUMADIN) 5 MG TABLET    Take 5 mg by mouth at bedtime.  Modified Medications   No medications on file  Discontinued Medications   No medications on file     Physical Exam: Filed Vitals:   06/14/13 1729  BP: 104/68  Pulse: 90   Temp: 98.2 F (36.8 C)  Resp: 18  SpO2: 96%   Constitutional: She is oriented to person, time and place. She appears well-developed and well-nourished. No distress.   Neck: Neck supple. No JVD present.   Cardiovascular: Normal rate, regular rhythm and intact distal pulses.    Respiratory: Effort normal and breath sounds normal. No respiratory distress. She has no wheezes.   GI: Soft. Bowel sounds are normal. She exhibits no distension. There is no tenderness.  Musculoskeletal: She exhibits trace edema in right leg. Incision present on right lateral upper leg with staples intact.  Skin well approximated and no wound dehiscence noted. End portion of incision with some eryhtma and warmth. No drainage noted. Good dorsalis pedis Neurological: She is alert and oriented to person, place and time Skin: Skin is warm and dry. She is not diaphoretic.  Psychiatric: She has a normal mood and affect.     Labs reviewed: Basic Metabolic Panel:  Recent Labs  78/29/56 1424  NA 145  K 3.8  CL 109  CO2 29  GLUCOSE 84  BUN 19  CREATININE 0.80  CALCIUM 9.0   CBC:  Recent Labs  06/13/13 1424  WBC 8.4  NEUTROABS 6.3  HGB 11.3*  HCT 35.8*  MCV 100.3*  PLT 535*   Assessment/Plan  Right femur fracture- s/p ORIF and here for STR. To work with PT and OT, fall precautions and will continue wound care. Continue  Current regimen of prn percocet for pain and prn robaxin for muscle spasm. On coumadin for dvt propylaxis  afib- rate controlled, continue coumadin and atenolol 50 mg daily. Monitor inr  Long term anticoagulation- inr today 2. Will stop lovenox and continue coumadin 5 mg daily and recheck inr this Thursday.   uti- complete course of keflex, encourage hydration  Cellulitis- continue kelfex and complete the course. Skin care and monitor wbc and temp curve  cva- old cva with residual gait problem and right sided weakness, bp controlled and on coumadin for stroke prophyalxis  type 2  dm- onitor cbg, good sugar control required to help with wound healing. Continue levemir 15 u bidwith apidra 5 u tid  Acute blood loss anemia- continue iron supplement, monitor cbc  HTN- bp well controlled. Continue atenolol  Constipation- continue docusate   Family/ staff Communication: reviewed care plan with patient and nursing supervisor   Goals of care: to return home   Labs/tests ordered- cbc, bmp

## 2013-06-15 LAB — URINE CULTURE: Colony Count: >=100000

## 2013-06-16 ENCOUNTER — Emergency Department (HOSPITAL_COMMUNITY): Payer: Medicare Other

## 2013-06-16 ENCOUNTER — Encounter (HOSPITAL_COMMUNITY): Payer: Self-pay | Admitting: Emergency Medicine

## 2013-06-16 ENCOUNTER — Non-Acute Institutional Stay (SKILLED_NURSING_FACILITY): Payer: Medicare Other | Admitting: Nurse Practitioner

## 2013-06-16 ENCOUNTER — Inpatient Hospital Stay (HOSPITAL_COMMUNITY)
Admission: EM | Admit: 2013-06-16 | Discharge: 2013-06-21 | DRG: 857 | Disposition: A | Discharge status: Skilled Nursing Facility | Payer: Medicare Other | Attending: Internal Medicine | Admitting: Internal Medicine

## 2013-06-16 ENCOUNTER — Encounter: Payer: Self-pay | Admitting: Nurse Practitioner

## 2013-06-16 ENCOUNTER — Encounter (HOSPITAL_COMMUNITY): Payer: Self-pay | Admitting: Internal Medicine

## 2013-06-16 DIAGNOSIS — I059 Rheumatic mitral valve disease, unspecified: Secondary | ICD-10-CM

## 2013-06-16 DIAGNOSIS — S81009A Unspecified open wound, unspecified knee, initial encounter: Secondary | ICD-10-CM

## 2013-06-16 DIAGNOSIS — Z5189 Encounter for other specified aftercare: Secondary | ICD-10-CM

## 2013-06-16 DIAGNOSIS — Z472 Encounter for removal of internal fixation device: Secondary | ICD-10-CM

## 2013-06-16 DIAGNOSIS — T84498A Other mechanical complication of other internal orthopedic devices, implants and grafts, initial encounter: Secondary | ICD-10-CM | POA: Diagnosis present

## 2013-06-16 DIAGNOSIS — T8459XA Infection and inflammatory reaction due to other internal joint prosthesis, initial encounter: Secondary | ICD-10-CM

## 2013-06-16 DIAGNOSIS — E1169 Type 2 diabetes mellitus with other specified complication: Secondary | ICD-10-CM | POA: Diagnosis present

## 2013-06-16 DIAGNOSIS — Y831 Surgical operation with implant of artificial internal device as the cause of abnormal reaction of the patient, or of later complication, without mention of misadventure at the time of the procedure: Secondary | ICD-10-CM | POA: Diagnosis present

## 2013-06-16 DIAGNOSIS — S81002A Unspecified open wound, left knee, initial encounter: Secondary | ICD-10-CM

## 2013-06-16 DIAGNOSIS — T8459XD Infection and inflammatory reaction due to other internal joint prosthesis, subsequent encounter: Secondary | ICD-10-CM

## 2013-06-16 DIAGNOSIS — E119 Type 2 diabetes mellitus without complications: Secondary | ICD-10-CM | POA: Diagnosis present

## 2013-06-16 DIAGNOSIS — N390 Urinary tract infection, site not specified: Secondary | ICD-10-CM

## 2013-06-16 DIAGNOSIS — I6350 Cerebral infarction due to unspecified occlusion or stenosis of unspecified cerebral artery: Secondary | ICD-10-CM

## 2013-06-16 DIAGNOSIS — I639 Cerebral infarction, unspecified: Secondary | ICD-10-CM | POA: Diagnosis present

## 2013-06-16 DIAGNOSIS — T814XXA Infection following a procedure, initial encounter: Secondary | ICD-10-CM

## 2013-06-16 DIAGNOSIS — Z96659 Presence of unspecified artificial knee joint: Secondary | ICD-10-CM

## 2013-06-16 DIAGNOSIS — T8130XA Disruption of wound, unspecified, initial encounter: Secondary | ICD-10-CM | POA: Diagnosis present

## 2013-06-16 DIAGNOSIS — I10 Essential (primary) hypertension: Secondary | ICD-10-CM | POA: Diagnosis present

## 2013-06-16 DIAGNOSIS — Z8673 Personal history of transient ischemic attack (TIA), and cerebral infarction without residual deficits: Secondary | ICD-10-CM

## 2013-06-16 DIAGNOSIS — Z794 Long term (current) use of insulin: Secondary | ICD-10-CM

## 2013-06-16 DIAGNOSIS — T84049A Periprosthetic fracture around unspecified internal prosthetic joint, initial encounter: Secondary | ICD-10-CM | POA: Diagnosis present

## 2013-06-16 DIAGNOSIS — A498 Other bacterial infections of unspecified site: Secondary | ICD-10-CM | POA: Diagnosis present

## 2013-06-16 DIAGNOSIS — Z7901 Long term (current) use of anticoagulants: Secondary | ICD-10-CM

## 2013-06-16 DIAGNOSIS — Z8719 Personal history of other diseases of the digestive system: Secondary | ICD-10-CM

## 2013-06-16 DIAGNOSIS — T8140XA Infection following a procedure, unspecified, initial encounter: Principal | ICD-10-CM | POA: Diagnosis present

## 2013-06-16 DIAGNOSIS — I4891 Unspecified atrial fibrillation: Secondary | ICD-10-CM | POA: Diagnosis present

## 2013-06-16 DIAGNOSIS — Z79899 Other long term (current) drug therapy: Secondary | ICD-10-CM

## 2013-06-16 HISTORY — DX: Presence of unspecified artificial knee joint: Z96.659

## 2013-06-16 LAB — BASIC METABOLIC PANEL
CO2: 28 mEq/L (ref 19–32)
Calcium: 9.2 mg/dL (ref 8.4–10.5)
Calcium: 8.8 mg/dL (ref 8.4–10.5)
Chloride: 107 mEq/L (ref 96–112)
Creatinine, Ser: 0.61 mg/dL (ref 0.50–1.10)
Creatinine, Ser: 0.69 mg/dL (ref 0.50–1.10)
GFR calc Af Amer: >90 mL/min (ref >90)
GFR calc Af Amer: 89 mL/min — ABNORMAL LOW (ref >90)
Glucose, Bld: 58 mg/dL — ABNORMAL LOW (ref 70–99)
Potassium: 4.6 mEq/L (ref 3.5–5.1)
Potassium: 3.7 mEq/L (ref 3.5–5.1)
Sodium: 142 mEq/L (ref 135–145)

## 2013-06-16 LAB — GLUCOSE, CAPILLARY
Glucose-Capillary: 109 mg/dL — ABNORMAL HIGH (ref 70–99)
Glucose-Capillary: 160 mg/dL — ABNORMAL HIGH (ref 70–99)

## 2013-06-16 LAB — CBC WITH DIFFERENTIAL/PLATELET
Basophils Absolute: 0 K/uL (ref 0.0–0.1)
Basophils Relative: 1 % (ref 0–1)
Eosinophils Absolute: 0.3 K/uL (ref 0.0–0.7)
Eosinophils Relative: 4 % (ref 0–5)
HCT: 36.5 % (ref 36.0–46.0)
Hemoglobin: 11.5 g/dL — ABNORMAL LOW (ref 12.0–15.0)
Lymphocytes Relative: 20 % (ref 12–46)
Lymphs Abs: 1.5 K/uL (ref 0.7–4.0)
MCH: 31.9 pg (ref 26.0–34.0)
MCHC: 31.5 g/dL (ref 30.0–36.0)
MCV: 101.1 fL — ABNORMAL HIGH (ref 78.0–100.0)
Monocytes Absolute: 0.5 K/uL (ref 0.1–1.0)
Monocytes Relative: 7 % (ref 3–12)
Neutro Abs: 5.2 K/uL (ref 1.7–7.7)
Neutrophils Relative %: 69 % (ref 43–77)
Platelets: 510 K/uL — ABNORMAL HIGH (ref 150–400)
RBC: 3.61 MIL/uL — ABNORMAL LOW (ref 3.87–5.11)
RDW: 14.7 % (ref 11.5–15.5)
WBC: 7.6 K/uL (ref 4.0–10.5)

## 2013-06-16 LAB — CBC
Platelets: 500 10*3/uL — ABNORMAL HIGH (ref 150–400)
RBC: 3.46 MIL/uL — ABNORMAL LOW (ref 3.87–5.11)
RDW: 14.6 % (ref 11.5–15.5)
WBC: 7.9 10*3/uL (ref 4.0–10.5)

## 2013-06-16 LAB — PROTIME-INR
INR: 1.9 — ABNORMAL HIGH (ref 0.00–1.49)
INR: 1.74 — ABNORMAL HIGH (ref 0.00–1.49)
Prothrombin Time: 19.8 seconds — ABNORMAL HIGH (ref 11.6–15.2)

## 2013-06-16 LAB — ABO/RH: ABO/RH(D): O POS

## 2013-06-16 MED ORDER — PANTOPRAZOLE SODIUM 40 MG PO TBEC
40.0000 mg | DELAYED_RELEASE_TABLET | Freq: Every day | ORAL | Status: DC
  Administered 2013-06-19 – 2013-06-21 (×3): 40 mg via ORAL
  Filled 2013-06-16 (×4): qty 1

## 2013-06-16 MED ORDER — ONDANSETRON HCL 4 MG PO TABS: 4.0000 mg | ORAL_TABLET | Freq: Four times a day (QID) | ORAL | Status: DC | PRN

## 2013-06-16 MED ORDER — DOCUSATE SODIUM 100 MG PO CAPS
100.0000 mg | ORAL_CAPSULE | Freq: Two times a day (BID) | ORAL | Status: DC
  Administered 2013-06-16 – 2013-06-21 (×7): 100 mg via ORAL
  Filled 2013-06-16 (×11): qty 1

## 2013-06-16 MED ORDER — CEFAZOLIN SODIUM-DEXTROSE 2-3 GM-% IV SOLR
2.0000 g | INTRAVENOUS | Status: AC
  Administered 2013-06-17: 2 g via INTRAVENOUS
  Filled 2013-06-16: qty 50

## 2013-06-16 MED ORDER — NITROGLYCERIN 0.4 MG SL SUBL: 0.4000 mg | SUBLINGUAL_TABLET | SUBLINGUAL | Status: DC | PRN

## 2013-06-16 MED ORDER — ACETAMINOPHEN 325 MG PO TABS: 650.0000 mg | ORAL_TABLET | Freq: Four times a day (QID) | ORAL | Status: DC | PRN

## 2013-06-16 MED ORDER — DEXTROSE 5 % AND 0.45 % NACL IV BOLUS: 50.0000 mL | INTRAVENOUS | Status: DC

## 2013-06-16 MED ORDER — DEXTROSE-NACL 5-0.45 % IV SOLN
100.0000 mL/h | INTRAVENOUS | Status: DC
  Administered 2013-06-16: 100 mL/h via INTRAVENOUS

## 2013-06-16 MED ORDER — LATANOPROST 0.005 % OP SOLN
1.0000 [drp] | Freq: Every day | OPHTHALMIC | Status: DC
  Administered 2013-06-16 – 2013-06-20 (×5): 1 [drp] via OPHTHALMIC
  Filled 2013-06-16: qty 2.5

## 2013-06-16 MED ORDER — ATENOLOL 50 MG PO TABS
50.0000 mg | ORAL_TABLET | Freq: Every day | ORAL | Status: DC
  Administered 2013-06-19 – 2013-06-21 (×3): 50 mg via ORAL
  Filled 2013-06-16 (×5): qty 1

## 2013-06-16 MED ORDER — ALBUTEROL SULFATE (5 MG/ML) 0.5% IN NEBU: 2.5000 mg | INHALATION_SOLUTION | RESPIRATORY_TRACT | Status: DC | PRN

## 2013-06-16 MED ORDER — INSULIN DETEMIR 100 UNIT/ML ~~LOC~~ SOLN: 15.0000 [IU] | Freq: Every day | SUBCUTANEOUS | Status: DC

## 2013-06-16 MED ORDER — GUAIFENESIN-DM 100-10 MG/5ML PO SYRP
5.0000 mL | ORAL_SOLUTION | ORAL | Status: DC | PRN
  Filled 2013-06-16: qty 5

## 2013-06-16 MED ORDER — PIPERACILLIN-TAZOBACTAM 3.375 G IVPB 30 MIN
3.3750 g | INTRAVENOUS | Status: AC
  Administered 2013-06-16: 3.375 g via INTRAVENOUS
  Filled 2013-06-16: qty 50

## 2013-06-16 MED ORDER — INSULIN ASPART 100 UNIT/ML ~~LOC~~ SOLN
0.0000 [IU] | Freq: Four times a day (QID) | SUBCUTANEOUS | Status: DC
  Administered 2013-06-16: 2 [IU] via SUBCUTANEOUS

## 2013-06-16 MED ORDER — VANCOMYCIN HCL 1000 MG IV SOLR
750.0000 mg | Freq: Two times a day (BID) | INTRAVENOUS | Status: DC
  Administered 2013-06-17: 750 mg via INTRAVENOUS
  Filled 2013-06-16 (×2): qty 750

## 2013-06-16 MED ORDER — HYDROCODONE-ACETAMINOPHEN 5-325 MG PO TABS
1.0000 | ORAL_TABLET | ORAL | Status: DC | PRN
  Administered 2013-06-18 – 2013-06-20 (×4): 2 via ORAL
  Filled 2013-06-16 (×5): qty 2

## 2013-06-16 MED ORDER — DEXTROSE 50 % IV SOLN
25.0000 mL | Freq: Once | INTRAVENOUS | Status: AC
  Administered 2013-06-16: 25 mL via INTRAVENOUS
  Filled 2013-06-16: qty 50

## 2013-06-16 MED ORDER — SODIUM CHLORIDE 0.9 % IV SOLN
INTRAVENOUS | Status: DC
  Administered 2013-06-16: 16:00:00 via INTRAVENOUS

## 2013-06-16 MED ORDER — ONDANSETRON HCL 4 MG/2ML IJ SOLN: 4.0000 mg | Freq: Four times a day (QID) | INTRAMUSCULAR | Status: DC | PRN

## 2013-06-16 MED ORDER — SODIUM CHLORIDE 0.9 % IJ SOLN
3.0000 mL | Freq: Two times a day (BID) | INTRAMUSCULAR | Status: DC
  Administered 2013-06-17: 3 mL via INTRAVENOUS

## 2013-06-16 MED ORDER — ACETAMINOPHEN 500 MG PO TABS
1000.0000 mg | ORAL_TABLET | Freq: Once | ORAL | Status: AC
  Administered 2013-06-16: 1000 mg via ORAL
  Filled 2013-06-16: qty 2

## 2013-06-16 MED ORDER — CHLORHEXIDINE GLUCONATE 4 % EX LIQD
60.0000 mL | Freq: Once | CUTANEOUS | Status: AC
  Administered 2013-06-17: 4 via TOPICAL
  Filled 2013-06-16: qty 60

## 2013-06-16 MED ORDER — HEPARIN (PORCINE) IN NACL 100-0.45 UNIT/ML-% IJ SOLN
1250.0000 [IU]/h | INTRAMUSCULAR | Status: DC
  Administered 2013-06-16: 1100 [IU]/h via INTRAVENOUS
  Administered 2013-06-17 – 2013-06-19 (×3): 1250 [IU]/h via INTRAVENOUS
  Filled 2013-06-16 (×5): qty 250

## 2013-06-16 MED ORDER — ACETAMINOPHEN 650 MG RE SUPP: 650.0000 mg | Freq: Four times a day (QID) | RECTAL | Status: DC | PRN

## 2013-06-16 MED ORDER — DEXTROSE 5 % IV SOLN
1.0000 g | Freq: Two times a day (BID) | INTRAVENOUS | Status: DC
  Administered 2013-06-16 – 2013-06-17 (×2): 1 g via INTRAVENOUS
  Filled 2013-06-16 (×3): qty 1

## 2013-06-16 MED ORDER — DEXTROSE 50 % IV SOLN: 25.0000 mL | Freq: Once | INTRAVENOUS | Status: DC

## 2013-06-16 MED ORDER — MORPHINE SULFATE 2 MG/ML IJ SOLN
1.0000 mg | INTRAMUSCULAR | Status: DC | PRN
  Administered 2013-06-18 – 2013-06-19 (×2): 1 mg via INTRAVENOUS
  Filled 2013-06-16 (×2): qty 1

## 2013-06-16 MED ORDER — VANCOMYCIN HCL IN DEXTROSE 1-5 GM/200ML-% IV SOLN
1000.0000 mg | Freq: Once | INTRAVENOUS | Status: AC
  Administered 2013-06-16: 1000 mg via INTRAVENOUS
  Filled 2013-06-16: qty 200

## 2013-06-16 MED ORDER — SODIUM CHLORIDE 0.9 % IV SOLN
INTRAVENOUS | Status: DC
  Administered 2013-06-16: 19:00:00 via INTRAVENOUS

## 2013-06-16 NOTE — ED Provider Notes (Signed)
CSN: [id number]     Arrival date & time 06/16/13  1351 History   First MD Initiated Contact with Patient 06/16/13 1358     Chief Complaint  Patient presents with  . Surgical Complication    (Consider location/radiation/quality/duration/timing/severity/associated sxs/prior Treatment) The history is provided by the patient.   patient here complaining of right knee pain from prior surgery. She has protruding hardware from where she recently had a reduction of a right femur fracture. She has noted a low-grade fever mild drainage. She is at a rehabilitation facility and they were changing her dressing when he noted that the hardware was taking out. There has also been increased redness and swelling around the surgical incision site with some dehising of the surgical wound. EMS was called and patient transported here. No treatment used prior to arrival. Symptoms have been persistent  Past Medical History  Diagnosis Date  . Hypertension   . Diabetes mellitus without complication   . Stroke   . A-fib    Past Surgical History  Procedure Laterality Date  . Joint replacement     No family history on file. History  Substance Use Topics  . Smoking status: Never Smoker   . Smokeless tobacco: Not on file  . Alcohol Use: No   OB History   Grav Para Term Preterm Abortions TAB SAB Ect Mult Living                 Review of Systems  All other systems reviewed and are negative.    Allergies  Review of patient's allergies indicates no known allergies.  Home Medications   Current Outpatient Rx  Name  Route  Sig  Dispense  Refill  . acetaminophen (TYLENOL) 650 MG CR tablet   Oral   Take 1,300 mg by mouth every 8 (eight) hours as needed for pain.         Marland Kitchen atenolol (TENORMIN) 50 MG tablet   Oral   Take 50 mg by mouth daily.         . cephALEXin (KEFLEX) 500 MG capsule   Oral   Take 1 capsule (500 mg total) by mouth 4 (four) times daily.   40 capsule   0   . cyclobenzaprine  (FLEXERIL) 10 MG tablet   Oral   Take 10 mg by mouth 3 (three) times daily as needed for muscle spasms.         Marland Kitchen docusate sodium (COLACE) 100 MG capsule   Oral   Take 100 mg by mouth 2 (two) times daily.         Marland Kitchen enoxaparin (LOVENOX) 40 MG/0.4ML injection   Subcutaneous   Inject 40 mg into the skin daily. D/c when INR 2-3 therapeutic         . insulin detemir (LEVEMIR) 100 UNIT/ML injection   Subcutaneous   Inject 15 Units into the skin 2 (two) times daily.         . insulin glulisine (APIDRA) 100 UNIT/ML injection   Subcutaneous   Inject 5 Units into the skin 3 (three) times daily before meals.         . iron polysaccharides (NIFEREX) 150 MG capsule   Oral   Take 150 mg by mouth daily.         Marland Kitchen latanoprost (XALATAN) 0.005 % ophthalmic solution   Both Eyes   Place 1 drop into both eyes at bedtime.         . methocarbamol (ROBAXIN) 500 MG tablet  Oral   Take 500 mg by mouth 3 (three) times daily as needed for muscle spasms.         . nitroGLYCERIN (NITROSTAT) 0.4 MG SL tablet   Sublingual   Place 0.4 mg under the tongue every 5 (five) minutes as needed for chest pain.         Marland Kitchen omeprazole (PRILOSEC) 20 MG capsule   Oral   Take 20 mg by mouth daily.         Marland Kitchen oxyCODONE-acetaminophen (PERCOCET/ROXICET) 5-325 MG per tablet   Oral   Take 1 tablet by mouth every 4 (four) hours as needed for severe pain.         Marland Kitchen warfarin (COUMADIN) 5 MG tablet   Oral   Take 5 mg by mouth at bedtime.          BP 105/61  Pulse 85  Temp(Src) 97.5 F (36.4 C) (Oral)  Resp 19  Wt 190 lb (86.183 kg)  SpO2 98% Physical Exam  Nursing note and vitals reviewed. Constitutional: She is oriented to person, place, and time. She appears well-developed and well-nourished.  Non-toxic appearance. No distress.  HENT:  Head: Normocephalic and atraumatic.  Eyes: Conjunctivae, EOM and lids are normal. Pupils are equal, round, and reactive to light.  Neck: Normal range of  motion. Neck supple. No tracheal deviation present. No mass present.  Cardiovascular: Normal rate, regular rhythm and normal heart sounds.  Exam reveals no gallop.   No murmur heard. Pulmonary/Chest: Effort normal and breath sounds normal. No stridor. No respiratory distress. She has no decreased breath sounds. She has no wheezes. She has no rhonchi. She has no rales.  Abdominal: Soft. Normal appearance and bowel sounds are normal. She exhibits no distension. There is no tenderness. There is no rebound and no CVA tenderness.  Musculoskeletal: Normal range of motion. She exhibits no edema and no tenderness.       Legs: Neurological: She is alert and oriented to person, place, and time. She has normal strength. No cranial nerve deficit or sensory deficit. GCS eye subscore is 4. GCS verbal subscore is 5. GCS motor subscore is 6.  Skin: Skin is warm and dry. No abrasion and no rash noted.  Psychiatric: She has a normal mood and affect. Her speech is normal and behavior is normal.    ED Course  Procedures (including critical care time) Labs Review Labs Reviewed  CBC WITH DIFFERENTIAL  BASIC METABOLIC PANEL   Imaging Review No results found.  EKG Interpretation   None       MDM  No diagnosis found. Patient given vancomycin for postoperative infection. I spoke with Dr. Eulah Pont from orthopedics and he would take the patient to the operating room tomorrow and has requested a medicine admission.     Toy Baker, MD 06/16/13 [phone]

## 2013-06-16 NOTE — Progress Notes (Signed)
PHARMACY FOLLOW UP NOTE   Pharmacy Consult for : Heparin Indication: Heparin for bridging in preparation for orthopedic surgery  Heparin Dosing Weight: 80 kg  Labs:  Recent Labs  06/16/13 1411 06/16/13 1855  HGB 11.5*  --   HCT 36.5  --   PLT 510*  --   LABPROT  --  21.2*  INR  --  1.90*   Lab Results  Component Value Date   INR 1.90* 06/16/2013    Estimated Creatinine Clearance: 56.8 ml/min (by C-G formula based on Cr of 0.61).  Pertinent Medications:   (Not in a hospital admission) Scheduled:  . atenolol  50 mg Oral Daily  . docusate sodium  100 mg Oral BID  . latanoprost  1 drop Both Eyes QHS  . pantoprazole  40 mg Oral Daily  . sodium chloride  3 mL Intravenous Q12H   Infusions:  . sodium chloride Stopped (06/16/13 1838)  . sodium chloride Stopped (06/16/13 2021)  . ceFEPime (MAXIPIME) IV Stopped (06/16/13 2021)  . [START ON 06/17/2013] dextrose 5 % and 0.45% NaCl    . dextrose 5 % and 0.45% NaCl 100 mL/hr (06/16/13 2021)  . [START ON 06/17/2013] vancomycin (VANCOCIN) 750 mg IVPB      Assessment:  79 y/o female admitted with an infected prosthetic knee joint who is admitted for treatment and possible surgery. She is currently on chronic Coumadin for VTE prophylaxis following prior orthopedic surgery.    Heparin bridging will begin while Coumadin held in preparation for surgery.  Baseline INR 1.9 [slightly subtherapeutic].  Currently no bleeding complications noted .  Goal:  Heparin level 0.3-0.7 units/ml   Plan: 1. Begin Heparin infusion, no bolus, at 1100 units/hr. Will check Heparin level with AM Labs , then Daily Heparin Levels, CBC and monitor for bleeding complications. Follow up post-op for VTE prophylaxis plans.   Aleshka Corney, Deetta Perla.D 06/16/2013, 9:09 PM

## 2013-06-16 NOTE — ED Notes (Signed)
Pt's CBG is 66 mg/dl.RN notified.

## 2013-06-16 NOTE — ED Notes (Addendum)
Pt arrives [facility] from [facility]. Pt had an open reduction for right femur fracture on 06/02/2013. Rehab facility was changing dressing today, noticed hardware was protruding from surgical incision and bleeding at the site.

## 2013-06-16 NOTE — ED Notes (Signed)
Pt's CBG is 109 mg/dl. RN notified.

## 2013-06-16 NOTE — ED Notes (Signed)
Patient transported to X-ray 

## 2013-06-16 NOTE — Consult Note (Addendum)
INFECTIOUS DISEASE CONSULT NOTE  Date of Admission:  06/16/2013  Date of Consult:  06/16/2013  Reason for Consult: Wound Infection Referring Physician: Thedore Mins  Impression/Recommendation Wound infection Hx of C diff DM, hypoglycemia UCx E coli (R-Amp)  Would Start on vanco as you have Start her on cefepime Await deep Cx from OR Watch her for si/sx of C diff She will need PIC, can be done after surgery Diabetic control will be important to wound healing, my great appreciation to [facility]  Comment Her exposed hardware is at high risk of infection. Will await ortho eval to see if her prosthesis can be salvaged. Will change her coverage to limit killing off her anaerobes which may (hoefully) keep her previous C diff in check.   Thank you so much for this interesting consult,   Johny Sax (pager) [phone] [email or web address]  Tammy Sharp is an 79 y.o. female.  HPI: 79 yo F with hx of DM and R THR 02-2012 with revision ~ 07-2012. She had C diff during this repair. While traveling, she sustained a fracture to her R femur 05-31-13. She underwent surgical repair of this (rod and screws?) and was ultimately d/c to SN[facility]) on 06-09-13. She was noted to have d/c from the inferior portion of her wound on 12-7, bloody. She had temp 99.7. She was seen in ED, told she had a UTI and started on keflex. Today she again had a "gush of blood" from her wound and was noted to have protruding hardware. She had plain films showing migration of her hardware per pt's family.   Past Medical History  Diagnosis Date  . Hypertension   . Diabetes mellitus without complication   . Stroke   . A-fib     Past Surgical History  Procedure Laterality Date  . Joint replacement    . Fracture surgery    . Orif femur decompression Right 2014    R mid femur fracture, between R hip and kneee prosthesis     No Known Allergies  Medications:  Scheduled: . atenolol  50 mg Oral Daily  .  docusate sodium  100 mg Oral BID  . latanoprost  1 drop Both Eyes QHS  . pantoprazole  40 mg Oral Daily  . sodium chloride  3 mL Intravenous Q12H    Total days of antibiotics: 0          Social History:  reports that she has never smoked. She does not have any smokeless tobacco history on file. She reports that she does not drink alcohol or use illicit drugs.  Family History  Problem Relation Age of Onset  . Cancer Brother     leukemia    General ROS: no dysphagia, no neuropathy, normal BM, no recent diarrhea, no dysuria, no fevers, see HPI.   Blood pressure 106/53, pulse 85, temperature 97.5 F (36.4 C), temperature source Oral, resp. rate 18, height 5' 6.93" (1.7 m), weight 86.2 kg (190 lb 0.6 oz), SpO2 96.00%. General appearance: alert, cooperative and no distress Eyes: negative findings: pupils equal, round, reactive to light and accomodation Throat: normal findings: no thrush and abnormal findings: dry Neck: no adenopathy and supple, symmetrical, trachea midline Lungs: clear to auscultation bilaterally Heart: regular rate and rhythm Abdomen: normal findings: bowel sounds normal and soft, non-tender Extremities: edema none and no diabetic foot ulcers. Her R leg shows staples along the lateral side from her mid-knee to her upper thigh. a screw is protruding at roughly her mid-knee. there  is blood visible here.  Neurologic: Sensory: normal in BLE, grossly   Results for orders placed during the hospital encounter of 06/16/13 (from the past 48 hour(s))  CBC WITH DIFFERENTIAL     Status: Abnormal   Collection Time    06/16/13  2:11 PM      Result Value Range   WBC 7.6  4.0 - 10.5 K/uL   RBC 3.61 (*) 3.87 - 5.11 MIL/uL   Hemoglobin 11.5 (*) 12.0 - 15.0 g/dL   HCT 16.1  09.6 - 04.5 %   MCV 101.1 (*) 78.0 - 100.0 fL   MCH 31.9  26.0 - 34.0 pg   MCHC 31.5  30.0 - 36.0 g/dL   RDW 40.9  81.1 - 91.4 %   Platelets 510 (*) 150 - 400 K/uL   Neutrophils Relative % 69  43 - 77 %    Neutro Abs 5.2  1.7 - 7.7 K/uL   Lymphocytes Relative 20  12 - 46 %   Lymphs Abs 1.5  0.7 - 4.0 K/uL   Monocytes Relative 7  3 - 12 %   Monocytes Absolute 0.5  0.1 - 1.0 K/uL   Eosinophils Relative 4  0 - 5 %   Eosinophils Absolute 0.3  0.0 - 0.7 K/uL   Basophils Relative 1  0 - 1 %   Basophils Absolute 0.0  0.0 - 0.1 K/uL  BASIC METABOLIC PANEL     Status: Abnormal   Collection Time    06/16/13  2:11 PM      Result Value Range   Sodium 142  135 - 145 mEq/L   Potassium 4.6  3.5 - 5.1 mEq/L   Comment: HEMOLYSIS AT THIS LEVEL MAY AFFECT RESULT   Chloride 108  96 - 112 mEq/L   CO2 28  19 - 32 mEq/L   Glucose, Bld 58 (*) 70 - 99 mg/dL   BUN 19  6 - 23 mg/dL   Creatinine, Ser 7.82  0.50 - 1.10 mg/dL   Calcium 9.2  8.4 - 95.6 mg/dL   GFR calc non Af Amer 80 (*) >90 mL/min   GFR calc Af Amer >90  >90 mL/min   Comment: (NOTE)     The eGFR has been calculated using the CKD EPI equation.     This calculation has not been validated in all clinical situations.     eGFR's persistently <90 mL/min signify possible Chronic Kidney     Disease.  GLUCOSE, CAPILLARY     Status: Abnormal   Collection Time    06/16/13  5:09 PM      Result Value Range   Glucose-Capillary 62 (*) 70 - 99 mg/dL      Component Value Date/Time   SDES URINE, RANDOM 06/13/2013 1530   SPECREQUEST NONE 06/13/2013 1530   CULT  Value: ESCHERICHIA COLI Performed at [facility] 06/13/2013 1530   REPTSTATUS 06/15/2013 FINAL 06/13/2013 1530   Dg Femur Right  06/16/2013   CLINICAL DATA:  Recent surgery, fall on 06/01/2013, screw poking os skin, draining pus and blood, history hypertension, diabetes  EXAM: RIGHT FEMUR - 2 VIEW  COMPARISON:  None  FINDINGS: Long stem of a right femoral prosthesis extends to the distal femur.  Components of the right knee prosthesis are identified in expected positions.  Lateral plate with cerclage wires and 4 distal screws identified post ORIF of a distal femoral diaphyseal fracture.   Fracture demonstrates mild apex lateral angulation and slight medial displacement.  Inferior  most screw has withdrawn from the plate and extends to the skin surface.  A gap is present between the lateral plate and the distal femur.  Visualized right pelvis intact.  Soft tissue swelling and skin clips identified at the distal thigh to the knee.  IMPRESSION: Prior right hip and knee replacements.  Prior ORIF of a distal femoral diaphyseal fracture with displacement of the dominant distal femoral fragment from the plate.  Inferior most screw extends has withdrawn from the plate and extends external to distal femoral soft tissues.   Electronically Signed   By: Ulyses Southward M.D.   On: 06/16/2013 16:19   Dg Knee Complete 4 Views Right  06/16/2013   CLINICAL DATA:  Fall, recent femoral fixation, presumably at an outside institution  EXAM: RIGHT KNEE - COMPLETE 4+ VIEW  COMPARISON:  No similar prior exam is available at this institution for comparison or on [facility] PACS.  FINDINGS: There is evidence of sideplate, screw, and cerclage wire fixation of a comminuted distal right femoral fracture. There has been apparent backing out of the distal femoral screw component with its head protruding from the skin. Comminuted fracture fragments are again noted with 1 full shaft width fracture fragment overlap. Total knee arthroplasty noted and presumed femoral component of total hip arthroplasty partly visualized. Surgical staples are in place.  IMPRESSION: Lateral displacement of femoral fracture fixation hardware indicating hardware failure, with protrusion of a surgical screw through the skin.  Comminuted distal femoral fracture with 1 full shaft width overlap of the fracture fragments.   Electronically Signed   By: Christiana Pellant M.D.   On: 06/16/2013 15:21   Recent Results (from the past 240 hour(s))  URINE CULTURE     Status: None   Collection Time    06/13/13  3:30 PM      Result Value Range Status   Specimen  Description URINE, RANDOM   Final   Special Requests NONE   Final   Culture  Setup Time     Final   Value: 06/13/2013 20:18     Performed at [facility] Count     Final   Value: >=100,000 COLONIES/ML     Performed at [facility]   Culture     Final   Value: ESCHERICHIA COLI     Performed at [facility]   Report Status 06/15/2013 FINAL   Final   Organism ID, Bacteria ESCHERICHIA COLI   Final      06/16/2013, 5:42 PM     LOS: 0 days

## 2013-06-16 NOTE — Consult Note (Addendum)
ORTHOPAEDIC CONSULTATION  REQUESTING PHYSICIAN: Toy Baker, MD  Chief Complaint: R failed hardware distal femur fracture  HPI: Tammy Sharp is a 79 y.o. female who complains of  A fall on 05/31/13 with a right periprosthetic femur fractuyre and ORIF. She was at snf and developed wound issues with prominent hardware.  Past Medical History  Diagnosis Date  . Hypertension   . Diabetes mellitus without complication   . Stroke   . A-fib    Past Surgical History  Procedure Laterality Date  . Joint replacement     History   Social History  . Marital Status: Widowed    Spouse Name: N/A    Number of Children: N/A  . Years of Education: N/A   Social History Main Topics  . Smoking status: Never Smoker   . Smokeless tobacco: None  . Alcohol Use: No  . Drug Use: No  . Sexual Activity: None   Other Topics Concern  . None   Social History Narrative  . None   No family history on file. No Known Allergies Prior to Admission medications   Medication Sig Start Date End Date Taking? Authorizing Provider  acetaminophen (TYLENOL) 325 MG tablet Take 650 mg by mouth every 6 (six) hours as needed for mild pain.   Yes Historical Provider, MD  atenolol (TENORMIN) 50 MG tablet Take 50 mg by mouth daily.   Yes Historical Provider, MD  cephALEXin (KEFLEX) 500 MG capsule Take 500 mg by mouth 4 (four) times daily. For 10 days starting 06/14/13   Yes Historical Provider, MD  docusate sodium (COLACE) 100 MG capsule Take 100 mg by mouth 2 (two) times daily.   Yes Historical Provider, MD  insulin detemir (LEVEMIR) 100 UNIT/ML injection Inject 15 Units into the skin 2 (two) times daily.   Yes Historical Provider, MD  insulin glulisine (APIDRA) 100 UNIT/ML injection Inject 5 Units into the skin 3 (three) times daily before meals.   Yes Historical Provider, MD  iron polysaccharides (NIFEREX) 150 MG capsule Take 150 mg by mouth daily.   Yes Historical Provider, MD  latanoprost (XALATAN)  0.005 % ophthalmic solution Place 1 drop into both eyes at bedtime.   Yes Historical Provider, MD  methocarbamol (ROBAXIN) 500 MG tablet Take 500 mg by mouth 3 (three) times daily as needed for muscle spasms.   Yes Historical Provider, MD  morphine 4 MG/ML injection Inject 4 mg into the vein every 2 (two) hours as needed for pain.   Yes Historical Provider, MD  nitroGLYCERIN (NITROSTAT) 0.4 MG SL tablet Place 0.4 mg under the tongue every 5 (five) minutes as needed for chest pain.   Yes Historical Provider, MD  omeprazole (PRILOSEC) 20 MG capsule Take 20 mg by mouth daily.   Yes Historical Provider, MD  ondansetron (ZOFRAN) 4 MG/2ML SOLN injection Inject 4 mg into the vein every 8 (eight) hours as needed for nausea or vomiting.   Yes Historical Provider, MD  oxyCODONE-acetaminophen (PERCOCET/ROXICET) 5-325 MG per tablet Take 1 tablet by mouth every 4 (four) hours as needed for severe pain.   Yes Historical Provider, MD  warfarin (COUMADIN) 5 MG tablet Take 5 mg by mouth at bedtime.   Yes Historical Provider, MD   Dg Knee Complete 4 Views Right  06/16/2013   CLINICAL DATA:  Fall, recent femoral fixation, presumably at an outside institution  EXAM: RIGHT KNEE - COMPLETE 4+ VIEW  COMPARISON:  No similar prior exam is available at this  institution for comparison or on [facility].  FINDINGS: There is evidence of sideplate, screw, and cerclage wire fixation of a comminuted distal right femoral fracture. There has been apparent backing out of the distal femoral screw component with its head protruding from the skin. Comminuted fracture fragments are again noted with 1 full shaft width fracture fragment overlap. Total knee arthroplasty noted and presumed femoral component of total hip arthroplasty partly visualized. Surgical staples are in place.  IMPRESSION: Lateral displacement of femoral fracture fixation hardware indicating hardware failure, with protrusion of a surgical screw through the skin.  Comminuted  distal femoral fracture with 1 full shaft width overlap of the fracture fragments.   Electronically Signed   By: Christiana Pellant M.D.   On: 06/16/2013 15:21    Positive ROS: All other systems have been reviewed and were otherwise negative with the exception of those mentioned in the HPI and as above.  Labs cbc  Recent Labs  06/16/13 1411  WBC 7.6  HGB 11.5*  HCT 36.5  PLT 510*    Labs inflam No results found for this basename: ESR, CRP,  in the last 72 hours  Labs coag No results found for this basename: INR, PT, PTT,  in the last 72 hours   Recent Labs  06/16/13 1411  NA 142  K 4.6  CL 108  CO2 28  GLUCOSE 58*  BUN 19  CREATININE 0.61  CALCIUM 9.2    Physical Exam: Filed Vitals:   06/16/13 1516  BP: 115/59  Pulse: 82  Temp:   Resp: 20   General: Alert, no acute distress Cardiovascular: No pedal edema Respiratory: No cyanosis, no use of accessory musculature GI: No organomegaly, abdomen is soft and non-tender Skin: No lesions in the area of chief complaint Neurologic: Sensation intact distally Psychiatric: Patient is competent for consent with normal mood and affect Lymphatic: No axillary or cervical lymphadenopathy  MUSCULOSKELETAL:  RLE: Staples in a lateral incision mostly benign with an area distally of wound seperation and prominent screw head. There is surroundin erythema at  This region but no purulent drainaige and min serous drainage SILT DP/SP/S/S/T nerve, 2+ DP, +TA/GS/EHL Compartments soft Other extremities are atraumatic with painless ROM and NVI.  Assessment: Failed hardware and distal femur periprosthetic fracture.   Plan: I&D with revision ORIF 12/11       With INR of 1.7 this is my upper limit but will procede and have hgb ready given risk of waiting and increased deep        infection risk. I am hopefull that there is not a deep seated infection but ID is involved in case long term abx are needed I will send deep cultures Monitor  for s/sx of c. diff Weight Bearing Status: NWB RLE No ROM RLE knee PT/OT VTE px: SCD's and chemical post op   Margarita Rana, D, MD Cell [phone]   06/16/2013 3:45 PM

## 2013-06-16 NOTE — Progress Notes (Signed)
06/16/2013  CODE STATUS:  FULL CODE [facility]:  ROM 601  Patient ID: Tammy Sharp, female   DOB: [birth year], 79 y.o.   MRN: [id number]   No Known Allergies  Chief Complaint  Patient presents with  . Acute Visit   Current Facility-Administered Medications on File Prior to Visit  Medication Dose Route Frequency Provider Last Rate Last Dose  . 0.9 %  sodium chloride infusion   Intravenous Continuous Toy Baker, MD 20 mL/hr at 06/16/13 1606    . vancomycin (VANCOCIN) IVPB 1000 mg/200 mL premix  1,000 mg Intravenous Once Toy Baker, MD 200 mL/hr at 06/16/13 1606 1,000 mg at 06/16/13 1606   Current Outpatient Prescriptions on File Prior to Visit  Medication Sig Dispense Refill  . acetaminophen (TYLENOL) 325 MG tablet Take 650 mg by mouth every 6 (six) hours as needed for mild pain.      Marland Kitchen atenolol (TENORMIN) 50 MG tablet Take 50 mg by mouth daily.      . cephALEXin (KEFLEX) 500 MG capsule Take 500 mg by mouth 4 (four) times daily. For 10 days starting 06/14/13      . docusate sodium (COLACE) 100 MG capsule Take 100 mg by mouth 2 (two) times daily.      . insulin detemir (LEVEMIR) 100 UNIT/ML injection Inject 15 Units into the skin 2 (two) times daily.      . insulin glulisine (APIDRA) 100 UNIT/ML injection Inject 5 Units into the skin 3 (three) times daily before meals.      . iron polysaccharides (NIFEREX) 150 MG capsule Take 150 mg by mouth daily.      Marland Kitchen latanoprost (XALATAN) 0.005 % ophthalmic solution Place 1 drop into both eyes at bedtime.      . methocarbamol (ROBAXIN) 500 MG tablet Take 500 mg by mouth 3 (three) times daily as needed for muscle spasms.      Marland Kitchen morphine 4 MG/ML injection Inject 4 mg into the vein every 2 (two) hours as needed for pain.      . nitroGLYCERIN (NITROSTAT) 0.4 MG SL tablet Place 0.4 mg under the tongue every 5 (five) minutes as needed for chest pain.      Marland Kitchen omeprazole (PRILOSEC) 20 MG capsule Take 20 mg by mouth daily.      .  ondansetron (ZOFRAN) 4 MG/2ML SOLN injection Inject 4 mg into the vein every 8 (eight) hours as needed for nausea or vomiting.      Marland Kitchen oxyCODONE-acetaminophen (PERCOCET/ROXICET) 5-325 MG per tablet Take 1 tablet by mouth every 4 (four) hours as needed for severe pain.      Marland Kitchen warfarin (COUMADIN) 5 MG tablet Take 5 mg by mouth at bedtime.        Past Medical History  Diagnosis Date  . Hypertension   . Diabetes mellitus without complication   . Stroke   . A-fib     Past Medical History  Diagnosis Date  . Hypertension   . Diabetes mellitus without complication   . Stroke   . A-fib    Past Surgical History  Procedure Laterality Date  . Joint replacement    . Fracture surgery    . Orif femur decompression Left 2014    L mid femur fracture, between L hip and kneee prosthesis   HPI:  C/O  L lateral knee pain with bleeding, and small wound opening.  Now presenting, upon removal of dressing, exposure of surgical hardware  Review of Systems  Cardiovascular: Negative for leg swelling.  Musculoskeletal:       L lateral knee incision,pain exposed surgical hardware    BP 118/69  Pulse 66  Temp(Src) 97.8 F (36.6 C)  Resp 18   Physical Exam  Cardiovascular: Normal heart sounds and intact distal pulses.   Pulmonary/Chest: Effort normal and breath sounds normal.  Musculoskeletal: She exhibits tenderness. She exhibits no edema.  Pt able to extend and flex L foot  Skin: Skin is warm and dry.     Open wound L lateral knee, with exposed surgical hardware.  Only bloody drainage, no surrounding erythema, or purulence evidenced  Psychiatric: She has a normal mood and affect. Her behavior is normal. Thought content normal.    Assessment/Plan: Exposure of surgical hardware.  Per family wishes, pt to be sent to [facility] for eval and likely surgical repair.  Would anticipate return to [facility] for rehab

## 2013-06-16 NOTE — ED Notes (Signed)
Pt moved from POD A to POD C.

## 2013-06-16 NOTE — H&P (Addendum)
[facility]                                                                                    Patient Demographics  Tammy Sharp, is a 79 y.o. female  MRN: [id number]   DOB - 03/24/1925  Admit Date - 06/16/2013  Outpatient Primary MD for the patient is Pcp Not In System   With History of -  Past Medical History  Diagnosis Date  . Hypertension   . Diabetes mellitus without complication   . Stroke   . A-fib       Past Surgical History  Procedure Laterality Date  . Joint replacement    . Fracture surgery    . Orif femur decompression R 2014    R mid femur fracture, between R hip and kneee prosthesis    in for   Chief Complaint  Patient presents with  . Surgical Complication      HPI  Tammy Sharp  is a 79 y.o. female, with history of atrial fibrillation on Coumadin, history of stroke few years ago from which she has completely recovered except poor balance, type 2 diabetes mellitus, hypertension, history of right total hip replacement and right total knee replacement who lives in [place] and was recently visiting family in [place] where she fell in the hotel room and developed fracture of the distal femoral shaft, she underwent open reduction internal fixation of the distal femoral shaft in [place] day before Thanksgiving, she was then sent to the rehabilitation facility in [place] as she had family in town. For the first few days of her stay in [place] she did well, subsequently physical therapy noted and that she was developing some wound dehiscence and finally they noticed a screw sticking out of her wound along with some serosanguineous and bloody discharge. She was then sent to the ER where she was diagnosed with postop infection around her total knee replacement and distal femoral shaft fracture, orthopedics was consulted who requested hospitalist to admit the patient.   She herself denies any fever chills, no chest  abdominal pain, no shortness of breath. She denies any history of CAD or CHF. No asthma COPD. No history of smoking or alcohol use.     Review of Systems    In addition to the HPI above, No Fever-chills, No Headache, No changes with Vision or hearing, No problems swallowing food or Liquids, No Chest pain, Cough or Shortness of Breath, No Abdominal pain, No Nausea or Vommitting, Bowel movements are regular, No Blood in stool or Urine, No dysuria, No new skin rashes or bruises, No new joints pains-aches, r.knee pain as above No new weakness, tingling, numbness in any extremity, No recent weight gain or loss, No polyuria, polydypsia or polyphagia, No significant Mental Stressors.  A full 10 point Review of Systems was done, except as stated above, all other Review of Systems were negative.   Social History History  Substance Use Topics  . Smoking status: Never Smoker   . Smokeless tobacco: Not on file  . Alcohol Use: No      Family History  No CAD at young age  Prior to Admission medications   Medication Sig Start Date End Date Taking? Authorizing Provider  acetaminophen (TYLENOL) 325 MG tablet Take 650 mg by mouth every 6 (six) hours as needed for mild pain.   Yes Historical Provider, MD  atenolol (TENORMIN) 50 MG tablet Take 50 mg by mouth daily.   Yes Historical Provider, MD  cephALEXin (KEFLEX) 500 MG capsule Take 500 mg by mouth 4 (four) times daily. For 10 days starting 06/14/13   Yes Historical Provider, MD  docusate sodium (COLACE) 100 MG capsule Take 100 mg by mouth 2 (two) times daily.   Yes Historical Provider, MD  insulin detemir (LEVEMIR) 100 UNIT/ML injection Inject 15 Units into the skin 2 (two) times daily.   Yes Historical Provider, MD  insulin glulisine (APIDRA) 100 UNIT/ML injection Inject 5 Units into the skin 3 (three) times daily before meals.   Yes Historical Provider, MD  iron polysaccharides (NIFEREX) 150 MG capsule Take 150 mg by mouth daily.   Yes  Historical Provider, MD  latanoprost (XALATAN) 0.005 % ophthalmic solution Place 1 drop into both eyes at bedtime.   Yes Historical Provider, MD  methocarbamol (ROBAXIN) 500 MG tablet Take 500 mg by mouth 3 (three) times daily as needed for muscle spasms.   Yes Historical Provider, MD  morphine 4 MG/ML injection Inject 4 mg into the vein every 2 (two) hours as needed for pain.   Yes Historical Provider, MD  nitroGLYCERIN (NITROSTAT) 0.4 MG SL tablet Place 0.4 mg under the tongue every 5 (five) minutes as needed for chest pain.   Yes Historical Provider, MD  omeprazole (PRILOSEC) 20 MG capsule Take 20 mg by mouth daily.   Yes Historical Provider, MD  ondansetron (ZOFRAN) 4 MG/2ML SOLN injection Inject 4 mg into the vein every 8 (eight) hours as needed for nausea or vomiting.   Yes Historical Provider, MD  oxyCODONE-acetaminophen (PERCOCET/ROXICET) 5-325 MG per tablet Take 1 tablet by mouth every 4 (four) hours as needed for severe pain.   Yes Historical Provider, MD  warfarin (COUMADIN) 5 MG tablet Take 5 mg by mouth at bedtime.   Yes Historical Provider, MD    No Known Allergies  Physical Exam   Vitals  Blood pressure 106/53, pulse 85, temperature 97.5 F (36.4 C), temperature source Oral, resp. rate 18, weight 86.183 kg (190 lb), SpO2 96.00%.   1. General elderly white female lying in bed in NAD,    2. Normal affect and insight, Not Suicidal or Homicidal, Awake Alert, Oriented X 3.  3. No F.N deficits, ALL C.Nerves Intact, Strength 5/5 all 4 extremities, Sensation intact all 4 extremities, Plantars down going.  4. Ears and Eyes appear Normal, Conjunctivae clear, PERRLA. Moist Oral Mucosa.  5. Supple Neck, No JVD, No cervical lymphadenopathy appriciated, No Carotid Bruits.  6. Symmetrical Chest wall movement, Good air movement bilaterally, CTAB.  7. iRRR, No Gallops, Rubs or Murmurs, No Parasternal Heave.  8. Positive Bowel Sounds, Abdomen Soft, Non tender, No organomegaly  appriciated,No rebound -guarding or rigidity.  9.  No Cyanosis, Normal Skin Turgor, No Skin Rash or Bruise. R TKR site has a screw sticking out on the lateral side, some blood oozing out.  10. Good muscle tone,  joints appear normal , no effusions, Normal ROM.  11. No Palpable Lymph Nodes in Neck or Axillae     Data Review  CBC  Recent Labs Lab 06/13/13 1424 06/16/13 1411  WBC 8.4 7.6  HGB 11.3* 11.5*  HCT 35.8* 36.5  PLT 535* 510*  MCV 100.3* 101.1*  MCH 31.7 31.9  MCHC 31.6 31.5  RDW 14.4 14.7  LYMPHSABS 1.4 1.5  MONOABS 0.5 0.5  EOSABS 0.3 0.3  BASOSABS 0.0 0.0   ------------------------------------------------------------------------------------------------------------------  Chemistries   Recent Labs Lab 06/13/13 1424 06/16/13 1411  NA 145 142  K 3.8 4.6  CL 109 108  CO2 29 28  GLUCOSE 84 58*  BUN 19 19  CREATININE 0.80 0.61  CALCIUM 9.0 9.2   ------------------------------------------------------------------------------------------------------------------ CrCl is unknown because both a height and weight (above a minimum accepted value) are required for this calculation. ------------------------------------------------------------------------------------------------------------------ No results found for this basename: TSH, T4TOTAL, FREET3, T3FREE, THYROIDAB,  in the last 72 hours   Coagulation profile No results found for this basename: INR, PROTIME,  in the last 168 hours ------------------------------------------------------------------------------------------------------------------- No results found for this basename: DDIMER,  in the last 72 hours -------------------------------------------------------------------------------------------------------------------  Cardiac Enzymes No results found for this basename: CK, CKMB, TROPONINI, MYOGLOBIN,  in the last 168  hours ------------------------------------------------------------------------------------------------------------------ No components found with this basename: POCBNP,    ---------------------------------------------------------------------------------------------------------------  Urinalysis    Component Value Date/Time   COLORURINE YELLOW 06/13/2013 1530   APPEARANCEUR TURBID* 06/13/2013 1530   LABSPEC 1.019 06/13/2013 1530   PHURINE 6.0 06/13/2013 1530   GLUCOSEU NEGATIVE 06/13/2013 1530   HGBUR MODERATE* 06/13/2013 1530   BILIRUBINUR NEGATIVE 06/13/2013 1530   KETONESUR NEGATIVE 06/13/2013 1530   PROTEINUR 100* 06/13/2013 1530   UROBILINOGEN 1.0 06/13/2013 1530   NITRITE NEGATIVE 06/13/2013 1530   LEUKOCYTESUR LARGE* 06/13/2013 1530    ----------------------------------------------------------------------------------------------------------------  Imaging results:   Dg Femur Right  06/16/2013   CLINICAL DATA:  Recent surgery, fall on 06/01/2013, screw poking os skin, draining pus and blood, history hypertension, diabetes  EXAM: RIGHT FEMUR - 2 VIEW  COMPARISON:  None  FINDINGS: Long stem of a right femoral prosthesis extends to the distal femur.  Components of the right knee prosthesis are identified in expected positions.  Lateral plate with cerclage wires and 4 distal screws identified post ORIF of a distal femoral diaphyseal fracture.  Fracture demonstrates mild apex lateral angulation and slight medial displacement.  Inferior most screw has withdrawn from the plate and extends to the skin surface.  A gap is present between the lateral plate and the distal femur.  Visualized right pelvis intact.  Soft tissue swelling and skin clips identified at the distal thigh to the knee.  IMPRESSION: Prior right hip and knee replacements.  Prior ORIF of a distal femoral diaphyseal fracture with displacement of the dominant distal femoral fragment from the plate.  Inferior most screw extends has  withdrawn from the plate and extends external to distal femoral soft tissues.   Electronically Signed   By: Ulyses Southward M.D.   On: 06/16/2013 16:19    Dg Knee Complete 4 Views Right  06/16/2013   CLINICAL DATA:  Fall, recent femoral fixation, presumably at an outside institution  EXAM: RIGHT KNEE - COMPLETE 4+ VIEW  COMPARISON:  No similar prior exam is available at this institution for comparison or on [facility] PACS.  FINDINGS: There is evidence of sideplate, screw, and cerclage wire fixation of a comminuted distal right femoral fracture. There has been apparent backing out of the distal femoral screw component with its head protruding from the skin. Comminuted fracture fragments are again noted with 1 full shaft width fracture fragment overlap. Total knee arthroplasty noted and presumed femoral component of total hip arthroplasty partly visualized. Surgical staples are in  place.  IMPRESSION: Lateral displacement of femoral fracture fixation hardware indicating hardware failure, with protrusion of a surgical screw through the skin.  Comminuted distal femoral fracture with 1 full shaft width overlap of the fracture fragments.   Electronically Signed   By: Christiana Pellant M.D.   On: 06/16/2013 15:21    My personal review of EKG: Rhythm Afib, Rate  75 /min,   no Acute ST changes    Assessment & Plan    1. Infection of the recent surgical correction of distal femoral shaft fracture in a patient with history of right total hip and right total knee replacement in the past. With x-ray showing -  Lateral displacement of femoral fracture fixation hardware indicating hardware failure, with protrusion of a surgical screw through the skin.  Comminuted distal femoral fracture with 1 full shaft width overlap of the fracture fragments. - Obtain blood cultures, telemetry monitoring, pharmacy to dose vancomycin and Zosyn, I have consulted ID, orthopedics is planning to see the patient later today and operating room  morning. Will obtain INR, we will also obtain a preop echogram.   She has recently tolerated surgical procedure in [facility], therefore she should be moderate risk during the perioperative period and we should proceed with surgery provided INR permits, patient and son except moderate adverse cardiopulmonary outcome risk during perioperative period.     2. Diabetes mellitus type 2. Sugars low, D50 now, D5 drip low rate, hold Levemir, placed on sliding scale insulin for now every 6 hours. She will be n.p.o. after midnight.     3. Hypertension. Stable continue home dose beta blocker. Will order IV Lopressor as needed basis.     4. Atrial fibrillation. Goal will be rate control, oral and IV beta blocker regimen as above. Coumadin will be held. We'll check baseline INR. Her [place] Vas 2 score is >5 , will monitor INR and bridge with heparin during the perioperative period. Will consult pharmacy.     DVT Prophylaxis Coumadin  AM Labs Ordered, also please review Full Orders  Family Communication: Admission, patients condition and plan of care including tests being ordered have been discussed with the patient and son who indicate understanding and agree with the plan and Code Status.  Code Status Full  Likely DC to  Rehab  Condition GUARDED   Time spent in minutes : 45    Stassi Fadely K M.D on 06/16/2013 at 4:28 PM  Between 7am to 7pm - Pager - [phone]  After 7pm go to [email or web address] - password [email or web address]  And look for the night coverage person covering me after hours  [facility] Office  [phone]

## 2013-06-16 NOTE — ED Notes (Addendum)
Orthopedic surgeon at bedside. 

## 2013-06-16 NOTE — ED Notes (Signed)
Per EMT, CBG 66, pt given graham crackers and cranberry juice

## 2013-06-16 NOTE — Consult Note (Addendum)
ANTIBIOTIC CONSULT NOTE-Preliminary  Pharmacy Consult for  Zosyn, Vancomycin :  Heparin Indication:  Infected prosthetic knee joint  :  Heparin for bridging in preparation for orthopedic surgery.  No Known Allergies  Patient Measurements: Height: 5' 6.93" (170 cm) Weight: 190 lb 0.6 oz (86.2 kg) IBW/kg (Calculated) : 61.44 Adjusted Body Weight: 86 kg   Vital Signs: Temp: 97.5 F (36.4 C) (12/10 1402) Temp src: Oral (12/10 1402) BP: 106/53 mmHg (12/10 1600) Pulse Rate: 85 (12/10 1600)  Labs:  Recent Labs  06/16/13 1411  WBC 7.6  HGB 11.5*  PLT 510*  CREATININE 0.61   No results found for this basename: INR, PROTIME     Estimated Creatinine Clearance: 56.8 ml/min (by C-G formula based on Cr of 0.61).   Microbiology: Recent Results (from the past 720 hour(s))  URINE CULTURE     Status: None   Collection Time    06/13/13  3:30 PM      Result Value Range Status   Specimen Description URINE, RANDOM   Final   Special Requests NONE   Final   Culture  Setup Time     Final   Value: 06/13/2013 20:18     Performed at [facility] Count     Final   Value: >=100,000 COLONIES/ML     Performed at [facility]   Culture     Final   Value: ESCHERICHIA COLI     Performed at [facility]   Report Status 06/15/2013 FINAL   Final   Organism ID, Bacteria ESCHERICHIA COLI   Final    Medical History: Past Medical History  Diagnosis Date  . Hypertension   . Diabetes mellitus without complication   . Stroke   . A-fib     Medications:   (Not in a hospital admission) Scheduled:   Infusions:  . sodium chloride 20 mL/hr at 06/16/13 1606  . piperacillin-tazobactam    . vancomycin 1,000 mg (06/16/13 1606)   Anti-infectives   Start     Dose/Rate Route Frequency Ordered Stop   06/16/13 1700  piperacillin-tazobactam (ZOSYN) IVPB 3.375 g     3.375 g 100 mL/hr over 30 Minutes Intravenous STAT 06/16/13 1645 06/17/13 1700   06/16/13 1600   vancomycin (VANCOCIN) IVPB 1000 mg/200 mL premix     1,000 mg 200 mL/hr over 60 Minutes Intravenous  Once 06/16/13 1545        Assessment:  79 y/o female admitted with an infected prosthetic knee joint.  Zosyn and Vancomycin per protocol have been ordered.  Patient has also been given a dose of Vancomycin x 1.  Patient on Coumadin PTA for VTE prophylaxis.  INR pending.  Heparin for bridging in preparation for surgery will be started as indicated.  Goal of Therapy:  Vancomycin trough level 15-20 mcg/ml  Zosyn selected for infection/cultures and will be adjusted for renal function.  Heparin level 0.3 - 0.7 units/ml.   Plan:  1. Preliminary review of pertinent patient information completed.  Protocol will be initiated with a one-time dose(s) of Zosyn and Vancomycin.  Clinical pharmacist will follow with a complete review to assess patient and finalize treatment regimen. 2. Recheck Scr later [ CrCl 57 ml/min in 79 y/o female not reliable.  Will recheck after fluids, etc ] 3. INR pending.  When results reported, will dose Heparin for bridging as indicated.  Shariq Puig, Elisha Headland,  Pharm.D. 06/16/2013,4:51 PM   ADDENDUM:   ID consulted and changed antibiotics  to Vancomycin + Cefepime.  Zosyn has been discontinued   Patient's Scr 06/13/13 reported as 0.8.  Estimated CrCl ~ 48 ml/min.    Will dose maintenance antibiotics based on Estimated CrCl 40 - 60 ml/min.   PLAN:  1. Begin Vancomycin 750 mg IV q 12 hours, first dose in AM. 2. Cefepime 1 gm IV q 12 hours as per ID orders. Follow up SCr, UOP, cultures, clinical course and adjust as clinically indicated.   Vancomycin levels as indicated.  Rimsha Trembley, Elisha Headland,  Pharm.D. ,  06/16/2013, 6:12 PM

## 2013-06-16 NOTE — ED Notes (Signed)
Spoke with orthopedic surgeon, pt will have surgery at 730am.

## 2013-06-16 NOTE — ED Notes (Signed)
MD at bedside - Dr. Allen 

## 2013-06-17 ENCOUNTER — Inpatient Hospital Stay (HOSPITAL_COMMUNITY): Payer: Medicare Other

## 2013-06-17 ENCOUNTER — Encounter (HOSPITAL_COMMUNITY): Payer: Medicare Other | Admitting: Anesthesiology

## 2013-06-17 ENCOUNTER — Encounter (HOSPITAL_COMMUNITY)
Admission: EM | Disposition: A | Discharge status: Skilled Nursing Facility | Payer: Self-pay | Source: Home / Self Care | Attending: Internal Medicine

## 2013-06-17 ENCOUNTER — Inpatient Hospital Stay (HOSPITAL_COMMUNITY): Payer: Medicare Other | Admitting: Anesthesiology

## 2013-06-17 DIAGNOSIS — I10 Essential (primary) hypertension: Secondary | ICD-10-CM

## 2013-06-17 DIAGNOSIS — T8140XA Infection following a procedure, unspecified, initial encounter: Secondary | ICD-10-CM

## 2013-06-17 DIAGNOSIS — A498 Other bacterial infections of unspecified site: Secondary | ICD-10-CM

## 2013-06-17 DIAGNOSIS — E119 Type 2 diabetes mellitus without complications: Secondary | ICD-10-CM

## 2013-06-17 DIAGNOSIS — N390 Urinary tract infection, site not specified: Secondary | ICD-10-CM

## 2013-06-17 HISTORY — PX: ORIF FEMUR FRACTURE: SHX2119

## 2013-06-17 LAB — CBC
HCT: 32.4 % — ABNORMAL LOW (ref 36.0–46.0)
MCH: 31.7 pg (ref 26.0–34.0)
MCHC: 32.4 g/dL (ref 30.0–36.0)
Platelets: INCREASED 10*3/uL (ref 150–400)
RDW: 17.9 % — ABNORMAL HIGH (ref 11.5–15.5)

## 2013-06-17 LAB — GLUCOSE, CAPILLARY
Glucose-Capillary: 117 mg/dL — ABNORMAL HIGH (ref 70–99)
Glucose-Capillary: 136 mg/dL — ABNORMAL HIGH (ref 70–99)
Glucose-Capillary: 209 mg/dL — ABNORMAL HIGH (ref 70–99)

## 2013-06-17 LAB — CREATININE, SERUM: GFR calc non Af Amer: 79 mL/min — ABNORMAL LOW (ref >90)

## 2013-06-17 LAB — HEPARIN LEVEL (UNFRACTIONATED)
Heparin Unfractionated: <0.1 IU/mL — ABNORMAL LOW (ref 0.30–0.70)
Heparin Unfractionated: <0.1 IU/mL — ABNORMAL LOW (ref 0.30–0.70)

## 2013-06-17 LAB — PROTIME-INR
INR: 1.96 — ABNORMAL HIGH (ref 0.00–1.49)
Prothrombin Time: 21.7 seconds — ABNORMAL HIGH (ref 11.6–15.2)

## 2013-06-17 LAB — SURGICAL PCR SCREEN
MRSA, PCR: NEGATIVE
Staphylococcus aureus: NEGATIVE

## 2013-06-17 SURGERY — OPEN REDUCTION INTERNAL FIXATION (ORIF) DISTAL FEMUR FRACTURE
Anesthesia: Regional | Site: Leg Upper | Laterality: Right

## 2013-06-17 MED ORDER — HALOPERIDOL LACTATE 5 MG/ML IJ SOLN: 5.0000 mg | Freq: Four times a day (QID) | INTRAMUSCULAR | Status: DC | PRN

## 2013-06-17 MED ORDER — SODIUM CHLORIDE 0.9 % IV SOLN
INTRAVENOUS | Status: DC
  Administered 2013-06-17 – 2013-06-19 (×2): via INTRAVENOUS

## 2013-06-17 MED ORDER — FENTANYL CITRATE 0.05 MG/ML IJ SOLN: 25.0000 ug | INTRAMUSCULAR | Status: DC | PRN

## 2013-06-17 MED ORDER — PHENYLEPHRINE HCL 10 MG/ML IJ SOLN
10.0000 mg | INTRAVENOUS | Status: DC | PRN
  Administered 2013-06-17: 50 ug/min via INTRAVENOUS

## 2013-06-17 MED ORDER — INSULIN ASPART 100 UNIT/ML ~~LOC~~ SOLN: 0.0000 [IU] | Freq: Three times a day (TID) | SUBCUTANEOUS | Status: DC

## 2013-06-17 MED ORDER — INSULIN ASPART 100 UNIT/ML ~~LOC~~ SOLN
0.0000 [IU] | Freq: Three times a day (TID) | SUBCUTANEOUS | Status: DC
  Administered 2013-06-18: 1 [IU] via SUBCUTANEOUS
  Administered 2013-06-19 (×2): 2 [IU] via SUBCUTANEOUS
  Administered 2013-06-19 – 2013-06-21 (×4): 1 [IU] via SUBCUTANEOUS
  Administered 2013-06-21: 2 [IU] via SUBCUTANEOUS

## 2013-06-17 MED ORDER — BUPIVACAINE-EPINEPHRINE PF 0.5-1:200000 % IJ SOLN
INTRAMUSCULAR | Status: DC | PRN
  Administered 2013-06-17: 30 mL

## 2013-06-17 MED ORDER — CEFEPIME HCL 1 G IJ SOLR
1.0000 g | Freq: Two times a day (BID) | INTRAMUSCULAR | Status: DC
  Administered 2013-06-17 – 2013-06-21 (×9): 1 g via INTRAVENOUS
  Filled 2013-06-17 (×10): qty 1

## 2013-06-17 MED ORDER — DEXTROSE-NACL 5-0.45 % IV SOLN
INTRAVENOUS | Status: DC
  Administered 2013-06-17: 12:00:00 via INTRAVENOUS

## 2013-06-17 MED ORDER — VANCOMYCIN HCL 1000 MG IV SOLR
750.0000 mg | Freq: Two times a day (BID) | INTRAVENOUS | Status: DC
  Administered 2013-06-17 – 2013-06-19 (×5): 750 mg via INTRAVENOUS
  Filled 2013-06-17 (×7): qty 750

## 2013-06-17 MED ORDER — ALBUMIN HUMAN 5 % IV SOLN
INTRAVENOUS | Status: DC | PRN
  Administered 2013-06-17: 08:00:00 via INTRAVENOUS

## 2013-06-17 MED ORDER — ONDANSETRON HCL 4 MG/2ML IJ SOLN: 4.0000 mg | Freq: Once | INTRAMUSCULAR | Status: DC | PRN

## 2013-06-17 MED ORDER — ONDANSETRON HCL 4 MG/2ML IJ SOLN
INTRAMUSCULAR | Status: DC | PRN
  Administered 2013-06-17: 4 mg via INTRAVENOUS

## 2013-06-17 MED ORDER — LACTATED RINGERS IV SOLN
INTRAVENOUS | Status: DC | PRN
  Administered 2013-06-17: 07:00:00 via INTRAVENOUS

## 2013-06-17 MED ORDER — PROPOFOL 10 MG/ML IV BOLUS
INTRAVENOUS | Status: DC | PRN
  Administered 2013-06-17: 80 mg via INTRAVENOUS

## 2013-06-17 MED ORDER — LIDOCAINE HCL (CARDIAC) 20 MG/ML IV SOLN
INTRAVENOUS | Status: DC | PRN
  Administered 2013-06-17: 30 mg via INTRAVENOUS

## 2013-06-17 MED ORDER — HALOPERIDOL 5 MG PO TABS: 5.0000 mg | ORAL_TABLET | Freq: Four times a day (QID) | ORAL | Status: DC | PRN

## 2013-06-17 MED ORDER — FENTANYL CITRATE 0.05 MG/ML IJ SOLN
INTRAMUSCULAR | Status: DC | PRN
  Administered 2013-06-17: 75 ug via INTRAVENOUS
  Administered 2013-06-17 (×2): 25 ug via INTRAVENOUS
  Administered 2013-06-17: 75 ug via INTRAVENOUS

## 2013-06-17 MED ORDER — LORAZEPAM 2 MG/ML IJ SOLN
0.5000 mg | Freq: Once | INTRAMUSCULAR | Status: AC
  Administered 2013-06-17: 0.5 mg via INTRAVENOUS
  Filled 2013-06-17: qty 1

## 2013-06-17 MED ORDER — CEFAZOLIN SODIUM-DEXTROSE 2-3 GM-% IV SOLR
2.0000 g | Freq: Four times a day (QID) | INTRAVENOUS | Status: DC
  Filled 2013-06-17 (×2): qty 50

## 2013-06-17 MED ORDER — HALOPERIDOL LACTATE 5 MG/ML IJ SOLN
INTRAMUSCULAR | Status: AC
  Administered 2013-06-17: 5 mg
  Filled 2013-06-17: qty 1

## 2013-06-17 MED ORDER — WARFARIN - PHARMACIST DOSING INPATIENT: Freq: Every day | Status: DC

## 2013-06-17 MED ORDER — LORAZEPAM 2 MG/ML IJ SOLN
0.5000 mg | Freq: Four times a day (QID) | INTRAMUSCULAR | Status: DC | PRN
  Administered 2013-06-18 – 2013-06-20 (×3): 0.5 mg via INTRAVENOUS
  Filled 2013-06-17 (×3): qty 1

## 2013-06-17 MED ORDER — WARFARIN SODIUM 6 MG PO TABS
6.0000 mg | ORAL_TABLET | Freq: Once | ORAL | Status: AC
  Filled 2013-06-17 (×2): qty 1

## 2013-06-17 MED ORDER — HALOPERIDOL LACTATE 5 MG/ML IJ SOLN
5.0000 mg | Freq: Four times a day (QID) | INTRAMUSCULAR | Status: DC | PRN
  Administered 2013-06-17: 5 mg via INTRAVENOUS

## 2013-06-17 SURGICAL SUPPLY — 67 items
BANDAGE ELASTIC 4 VELCRO ST LF (GAUZE/BANDAGES/DRESSINGS) ×2 IMPLANT
BANDAGE ELASTIC 6 VELCRO ST LF (GAUZE/BANDAGES/DRESSINGS) ×2 IMPLANT
BANDAGE GAUZE ELAST BULKY 4 IN (GAUZE/BANDAGES/DRESSINGS) ×2 IMPLANT
BIT DRILL 3.1X204 (BIT) ×2 IMPLANT
BLADE SURG ROTATE 9660 (MISCELLANEOUS) IMPLANT
BNDG GAUZE ELAST 4 BULKY (GAUZE/BANDAGES/DRESSINGS) ×2 IMPLANT
BRUSH SCRUB DISP (MISCELLANEOUS) ×4 IMPLANT
CLOTH BEACON ORANGE TIMEOUT ST (SAFETY) ×2 IMPLANT
DRAPE C-ARM 42X72 X-RAY (DRAPES) ×2 IMPLANT
DRAPE C-ARMOR (DRAPES) ×2 IMPLANT
DRAPE ORTHO SPLIT 77X108 STRL (DRAPES) ×3
DRAPE SURG ORHT 6 SPLT 77X108 (DRAPES) ×3 IMPLANT
DRAPE U-SHAPE 47X51 STRL (DRAPES) ×2 IMPLANT
DRSG ADAPTIC 3X8 NADH LF (GAUZE/BANDAGES/DRESSINGS) ×2 IMPLANT
DRSG PAD ABDOMINAL 8X10 ST (GAUZE/BANDAGES/DRESSINGS) ×8 IMPLANT
ELECT REM PT RETURN 9FT ADLT (ELECTROSURGICAL) ×2
ELECTRODE REM PT RTRN 9FT ADLT (ELECTROSURGICAL) ×1 IMPLANT
EVACUATOR 1/8 PVC DRAIN (DRAIN) IMPLANT
EVACUATOR 3/16  PVC DRAIN (DRAIN)
EVACUATOR 3/16 PVC DRAIN (DRAIN) IMPLANT
GLOVE BIO SURGEON STRL SZ7.5 (GLOVE) ×2 IMPLANT
GLOVE BIO SURGEON STRL SZ8 (GLOVE) ×2 IMPLANT
GLOVE BIOGEL PI IND STRL 7.5 (GLOVE) ×1 IMPLANT
GLOVE BIOGEL PI IND STRL 8 (GLOVE) ×1 IMPLANT
GLOVE BIOGEL PI INDICATOR 7.5 (GLOVE) ×1
GLOVE BIOGEL PI INDICATOR 8 (GLOVE) ×1
GOWN PREVENTION PLUS XLARGE (GOWN DISPOSABLE) ×2 IMPLANT
GOWN STRL NON-REIN LRG LVL3 (GOWN DISPOSABLE) ×4 IMPLANT
KIT BASIN OR (CUSTOM PROCEDURE TRAY) ×2 IMPLANT
KIT ROOM TURNOVER OR (KITS) ×2 IMPLANT
MANIFOLD NEPTUNE II (INSTRUMENTS) ×2 IMPLANT
NEEDLE 22X1 1/2 (OR ONLY) (NEEDLE) IMPLANT
NS IRRIG 1000ML POUR BTL (IV SOLUTION) ×2 IMPLANT
PACK TOTAL JOINT (CUSTOM PROCEDURE TRAY) ×2 IMPLANT
PAD ARMBOARD 7.5X6 YLW CONV (MISCELLANEOUS) ×4 IMPLANT
PAD CAST 4YDX4 CTTN HI CHSV (CAST SUPPLIES) ×1 IMPLANT
PADDING CAST COTTON 4X4 STRL (CAST SUPPLIES) ×1
PADDING CAST COTTON 6X4 STRL (CAST SUPPLIES) ×2 IMPLANT
PLATE 10H DIST LAT RT (Plate) ×2 IMPLANT
PLUG CABLE 5.0MM (Cable) ×4 IMPLANT
SCREW CORTEX ST MATTA 4.5X30MM (Screw) ×2 IMPLANT
SCREW CORTEX ST MATTA 4.5X40MM (Screw) ×2 IMPLANT
SCREW LOCK 5.0X30MM (Screw) ×2 IMPLANT
SCREW LOCK 5.0X40MM (Screw) ×2 IMPLANT
SCREW LOCK 5.0X75MM (Screw) ×2 IMPLANT
SCREW LOCK 5.0X80MM (Screw) ×2 IMPLANT
SCREW LOCKING 5.0X28MM (Screw) ×2 IMPLANT
SLEEVE BEADED CABLE (Orthopedic Implant) ×8 IMPLANT
SPONGE GAUZE 4X4 12PLY (GAUZE/BANDAGES/DRESSINGS) ×2 IMPLANT
SPONGE LAP 18X18 X RAY DECT (DISPOSABLE) ×2 IMPLANT
STAPLER VISISTAT 35W (STAPLE) ×2 IMPLANT
SUCTION FRAZIER TIP 10 FR DISP (SUCTIONS) ×2 IMPLANT
SUT ETHILON 2 0 FS 18 (SUTURE) ×2 IMPLANT
SUT PDS AB 1 CT  36 (SUTURE) ×1
SUT PDS AB 1 CT 36 (SUTURE) ×1 IMPLANT
SUT PROLENE 0 CT 2 (SUTURE) IMPLANT
SUT VIC AB 0 CT1 27 (SUTURE) ×2
SUT VIC AB 0 CT1 27XBRD ANBCTR (SUTURE) ×2 IMPLANT
SUT VIC AB 1 CT1 27 (SUTURE) ×2
SUT VIC AB 1 CT1 27XBRD ANBCTR (SUTURE) ×2 IMPLANT
SUT VIC AB 2-0 CT1 27 (SUTURE) ×2
SUT VIC AB 2-0 CT1 TAPERPNT 27 (SUTURE) ×2 IMPLANT
SYR 20ML ECCENTRIC (SYRINGE) IMPLANT
TOWEL OR 17X24 6PK STRL BLUE (TOWEL DISPOSABLE) ×2 IMPLANT
TOWEL OR 17X26 10 PK STRL BLUE (TOWEL DISPOSABLE) ×4 IMPLANT
TRAY FOLEY CATH 16FRSI W/METER (SET/KITS/TRAYS/PACK) IMPLANT
WATER STERILE IRR 1000ML POUR (IV SOLUTION) ×4 IMPLANT

## 2013-06-17 NOTE — Care Management Note (Signed)
CARE MANAGEMENT NOTE 06/17/2013  Patient:  Tammy Sharp, Tammy Sharp   Account Number:  [id number]  Date Initiated:  06/17/2013  Documentation initiated by:  Vance Peper  Subjective/Objective Assessment:   79 yr old female s/p ORIF distal femur fracture.     Action/Plan:   Patient is from [facility]. Social worker notified.No further CM needs.   Anticipated DC Date:  06/19/2013   Anticipated DC Plan:  SKILLED NURSING FACILITY  In-house referral  Clinical Social Worker      DC Planning Services  CM consult      Choice offered to / List presented to:             Status of service:  Completed, signed off Medicare Important Message given?   (If response is "NO", the following Medicare IM given date fields will be blank) Date Medicare IM given:   Date Additional Medicare IM given:    Discharge Disposition:  SKILLED NURSING FACILITY

## 2013-06-17 NOTE — Progress Notes (Signed)
INFECTIOUS DISEASE PROGRESS NOTE  ID: Tammy Sharp is a 79 y.o. female with  Principal Problem:   Infected prosthetic knee joint Active Problems:   Post op infection   A-fib   CVA (cerebral infarction)   HTN (hypertension)   DM2 (diabetes mellitus, type 2)  Subjective: (Confused) Without complaints  Abtx:  Anti-infectives   Start     Dose/Rate Route Frequency Ordered Stop   06/17/13 1800  vancomycin (VANCOCIN) 750 mg in sodium chloride 0.9 % 150 mL IVPB     750 mg 150 mL/hr over 60 Minutes Intravenous Every 12 hours 06/17/13 1427     06/17/13 1400  ceFAZolin (ANCEF) IVPB 2 g/50 mL premix  Status:  Discontinued     2 g 100 mL/hr over 30 Minutes Intravenous Every 6 hours 06/17/13 1145 06/17/13 1351   06/17/13 1400  ceFEPIme (MAXIPIME) 1 g in dextrose 5 % 50 mL IVPB     1 g 100 mL/hr over 30 Minutes Intravenous Every 12 hours 06/17/13 1351     06/17/13 0600  ceFAZolin (ANCEF) IVPB 2 g/50 mL premix     2 g 100 mL/hr over 30 Minutes Intravenous On call to O.R. 06/16/13 2155 06/17/13 0744   06/17/13 0400  vancomycin (VANCOCIN) 750 mg in sodium chloride 0.9 % 150 mL IVPB  Status:  Discontinued     750 mg 150 mL/hr over 60 Minutes Intravenous Every 12 hours 06/16/13 1813 06/17/13 1211   06/16/13 1830  ceFEPIme (MAXIPIME) 1 g in dextrose 5 % 50 mL IVPB  Status:  Discontinued     1 g 100 mL/hr over 30 Minutes Intravenous Every 12 hours 06/16/13 1750 06/17/13 1211   06/16/13 1700  piperacillin-tazobactam (ZOSYN) IVPB 3.375 g     3.375 g 100 mL/hr over 30 Minutes Intravenous STAT 06/16/13 1645 06/16/13 1810   06/16/13 1600  vancomycin (VANCOCIN) IVPB 1000 mg/200 mL premix     1,000 mg 200 mL/hr over 60 Minutes Intravenous  Once 06/16/13 1545 06/16/13 1718      Medications:  Scheduled: . atenolol  50 mg Oral Daily  . ceFEPime (MAXIPIME) IV  1 g Intravenous Q12H  . docusate sodium  100 mg Oral BID  . insulin aspart  0-9 Units Subcutaneous TID WC  . latanoprost  1 drop  Both Eyes QHS  . pantoprazole  40 mg Oral Daily  . sodium chloride  3 mL Intravenous Q12H  . vancomycin (VANCOCIN) 750 mg IVPB  750 mg Intravenous Q12H  . warfarin  6 mg Oral ONCE-1800  . Warfarin - Pharmacist Dosing Inpatient   Does not apply q1800    Objective: Vital signs in last 24 hours: Temp:  [97 F (36.1 C)-98 F (36.7 C)] 97.9 F (36.6 C) (12/11 1303) Pulse Rate:  [66-102] 86 (12/11 1303) Resp:  [10-23] 16 (12/11 1303) BP: (89-125)/(43-93) 101/56 mmHg (12/11 1303) SpO2:  [94 %-100 %] 99 % (12/11 1303) Weight:  [86.2 kg (190 lb 0.6 oz)-91 kg (200 lb 9.9 oz)] 91 kg (200 lb 9.9 oz) (12/11 0438)   General appearance: alert, no distress and MSE: 2/3 (is waiting to be taken up to her room) Resp: clear to auscultation bilaterally Cardio: regular rate and rhythm GI: normal findings: bowel sounds normal and soft, non-tender Extremities: RLE wrapped.   Lab Results  Recent Labs  06/16/13 1411 06/16/13 2225 06/17/13 1215  WBC 7.6 7.9 11.5*  HGB 11.5* 11.1* 10.5*  HCT 36.5 34.8* 32.4*  NA 142 142  --  K 4.6 3.7  --   CL 108 107  --   CO2 28 28  --   BUN 19 18  --   CREATININE 0.61 0.69 0.62   Liver Panel No results found for this basename: PROT, ALBUMIN, AST, ALT, ALKPHOS, BILITOT, BILIDIR, IBILI,  in the last 72 hours Sedimentation Rate No results found for this basename: ESRSEDRATE,  in the last 72 hours C-Reactive Protein No results found for this basename: CRP,  in the last 72 hours  Microbiology: Recent Results (from the past 240 hour(s))  URINE CULTURE     Status: None   Collection Time    06/13/13  3:30 PM      Result Value Range Status   Specimen Description URINE, RANDOM   Final   Special Requests NONE   Final   Culture  Setup Time     Final   Value: 06/13/2013 20:18     Performed at [facility] Count     Final   Value: >=100,000 COLONIES/ML     Performed at [facility]   Culture     Final   Value: ESCHERICHIA COLI      Performed at [facility]   Report Status 06/15/2013 FINAL   Final   Organism ID, Bacteria ESCHERICHIA COLI   Final  SURGICAL PCR SCREEN     Status: None   Collection Time    June 22, 2013  4:36 AM      Result Value Range Status   MRSA, PCR NEGATIVE  NEGATIVE Final   Staphylococcus aureus NEGATIVE  NEGATIVE Final   Comment:            The Xpert SA Assay (FDA     approved for NASAL specimens     in patients over 50 years of age),     is one component of     a comprehensive surveillance     program.  Test performance has     been validated by [employer] for patients greater     than or equal to 29 year old.     It is not intended     to diagnose infection nor to     guide or monitor treatment.    Studies/Results: Dg Femur Right  06-22-2013   CLINICAL DATA:  Intra op right femur ORIF  EXAM: RIGHT FEMUR - 2 VIEW  COMPARISON:  06/16/2013  FINDINGS: A total right hip arthroplasty is appreciated with cerclage wiring along the proximal femur. The femoral component appears intact. Lateral buttress plate and cannulated screw fixation is identified along the distal femur with cerclage wiring. A total right knee arthroplasty is appreciated.  IMPRESSION: ORIF right femur   Electronically Signed   By: Salome Holmes M.D.   On: 06/22/2013 11:47   Dg Femur Right  06/16/2013   CLINICAL DATA:  Recent surgery, fall on 06/01/2013, screw poking os skin, draining pus and blood, history hypertension, diabetes  EXAM: RIGHT FEMUR - 2 VIEW  COMPARISON:  None  FINDINGS: Long stem of a right femoral prosthesis extends to the distal femur.  Components of the right knee prosthesis are identified in expected positions.  Lateral plate with cerclage wires and 4 distal screws identified post ORIF of a distal femoral diaphyseal fracture.  Fracture demonstrates mild apex lateral angulation and slight medial displacement.  Inferior most screw has withdrawn from the plate and extends to the skin surface.  A gap is  present between the lateral plate and the distal femur.  Visualized right pelvis intact.  Soft tissue swelling and skin clips identified at the distal thigh to the knee.  IMPRESSION: Prior right hip and knee replacements.  Prior ORIF of a distal femoral diaphyseal fracture with displacement of the dominant distal femoral fragment from the plate.  Inferior most screw extends has withdrawn from the plate and extends external to distal femoral soft tissues.   Electronically Signed   By: Ulyses Southward M.D.   On: 06/16/2013 16:19   Dg Chest Port 1 View  06/17/2013   CLINICAL DATA:  Preop for right femoral fracture  EXAM: PORTABLE CHEST - 1 VIEW  COMPARISON:  None.  FINDINGS: Cardiomediastinal silhouette is unremarkable. Elevation of the right hemidiaphragm. Mild atherosclerotic calcifications of thoracic aorta. No acute infiltrate or pulmonary edema.  IMPRESSION: No active disease.   Electronically Signed   By: Natasha Mead M.D.   On: 06/17/2013 08:14   Dg Knee Complete 4 Views Right  06/16/2013   CLINICAL DATA:  Fall, recent femoral fixation, presumably at an outside institution  EXAM: RIGHT KNEE - COMPLETE 4+ VIEW  COMPARISON:  No similar prior exam is available at this institution for comparison or on [facility] PACS.  FINDINGS: There is evidence of sideplate, screw, and cerclage wire fixation of a comminuted distal right femoral fracture. There has been apparent backing out of the distal femoral screw component with its head protruding from the skin. Comminuted fracture fragments are again noted with 1 full shaft width fracture fragment overlap. Total knee arthroplasty noted and presumed femoral component of total hip arthroplasty partly visualized. Surgical staples are in place.  IMPRESSION: Lateral displacement of femoral fracture fixation hardware indicating hardware failure, with protrusion of a surgical screw through the skin.  Comminuted distal femoral fracture with 1 full shaft width overlap of the fracture  fragments.   Electronically Signed   By: Christiana Pellant M.D.   On: 06/16/2013 15:21   Dg C-arm 1-60 Min  06/17/2013   CLINICAL DATA:  Hardware removal.  EXAM: DG C-ARM 1-60 MIN  TECHNIQUE: Six C-arm views submitted for review after procedure  COMPARISON:  06/16/2013.  FLUOROSCOPY TIME:  40 seconds  FINDINGS: Long stem right total hip prosthesis with proximal distal cerclage wires in place. Long segment sideplate distal femur. The distal aspect of the sideplate has been reapproximated with the distal femoral fracture with screws placed. Post right knee replacement. These findings can be assessed on follow up.  IMPRESSION: Long stem right total hip prosthesis with proximal distal cerclage wires in place.  Long segment sideplate distal femur. The distal aspect of the sideplate has been reapproximated with the distal femoral fracture with screws placed.  Post right knee replacement.  These findings can be assessed on follow up.   Electronically Signed   By: Bridgett Larsson M.D.   On: 06/17/2013 12:24     Assessment/Plan: Wound infection  Repeat ORIF R leg today Hx of C diff  DM, hypoglycemia  UCx E coli (R-Amp)  Total days of antibiotics: 2 (vanco/cefepime)         Will continue her current anbx  Order PIC when she is lucid and can consent Hopefully deep OR Cx done FSG appear well controlled.   Johny Sax Infectious Diseases (pager) [phone] [email or web address] 06/17/2013, 3:24 PM  LOS: 1 day

## 2013-06-17 NOTE — Interval H&P Note (Signed)
History and Physical Interval Note:  06/17/2013 4:51 AM  Tammy Sharp  has presented today for surgery, with the diagnosis of Right Distal Femur Fracture  The various methods of treatment have been discussed with the patient and family. After consideration of risks, benefits and other options for treatment, the patient has consented to  Procedure(s): OPEN REDUCTION INTERNAL FIXATION (ORIF) DISTAL FEMUR FRACTURE (Right) as a surgical intervention .  The patient's history has been reviewed, patient examined, no change in status, stable for surgery.  I have reviewed the patient's chart and labs.  Questions were answered to the patient's satisfaction.     Shakira Los, D

## 2013-06-17 NOTE — Progress Notes (Addendum)
ANTICOAGULATION CONSULT NOTE - Follow Up Consult  Pharmacy Consult for heparin Indication: afib and VTE prophylaxis  No Known Allergies  Patient Measurements: Height: 5' 6.93" (170 cm) Weight: 200 lb 9.9 oz (91 kg) IBW/kg (Calculated) : 61.44 Heparin Dosing Weight: 80 kg  Vital Signs: Temp: 98.6 F (37 C) (12/11 2141) Temp src: Axillary (12/11 2141) BP: 113/47 mmHg (12/11 2141) Pulse Rate: 115 (12/11 2141)  Labs:  Recent Labs  06/16/13 1411 06/16/13 1855 06/16/13 2225 06/17/13 0357 06/17/13 1215  HGB 11.5*  --  11.1*  --  10.5*  HCT 36.5  --  34.8*  --  32.4*  PLT 510*  --  500*  --  PLATELETS APPEAR INCREASED  LABPROT  --  21.2* 19.8* 21.7*  --   INR  --  1.90* 1.74* 1.96*  --   HEPARINUNFRC  --   --   --  <0.10*  --   CREATININE 0.61  --  0.69  --  0.62    Estimated Creatinine Clearance: 58.3 ml/min (by C-G formula based on Cr of 0.62).   Medications:  Scheduled:  . atenolol  50 mg Oral Daily  . ceFEPime (MAXIPIME) IV  1 g Intravenous Q12H  . docusate sodium  100 mg Oral BID  . insulin aspart  0-9 Units Subcutaneous TID WC  . latanoprost  1 drop Both Eyes QHS  . pantoprazole  40 mg Oral Daily  . sodium chloride  3 mL Intravenous Q12H  . vancomycin (VANCOCIN) 750 mg IVPB  750 mg Intravenous Q12H  . warfarin  6 mg Oral ONCE-1800  . Warfarin - Pharmacist Dosing Inpatient   Does not apply q1800   Infusions:  . sodium chloride 75 mL/hr at 06/17/13 1609  . dextrose 5 % and 0.45% NaCl    . heparin 1,250 Units/hr (06/17/13 1319)    Assessment: 79 yo female with is currently on subtherapeutic heparin for afib and VTE prophylaxis s/p ortho surgery.  Heparin level is <0.1.  INR is 1.96.  Per RN, heparin was off for ~10 minutes b/w 4 to 5 pm and then patient pulled out line. Jayline Kilburg heparin was off and did not get restarted until ~2200 Goal of Therapy:  Heparin level 0.3-0.7 units/ml Monitor platelets by anticoagulation protocol: Yes   Plan:  1) Continue heparin at  1250 units/hr and recheck a heparin level in 8hr 2) Daily heparin level and CBC  Billy Rocco, Tsz-Yin 06/17/2013,9:54 PM

## 2013-06-17 NOTE — H&P (View-Only) (Signed)
ORTHOPAEDIC CONSULTATION  REQUESTING PHYSICIAN: Toy Baker, MD  Chief Complaint: R failed hardware distal femur fracture  HPI: Tammy Sharp is a 79 y.o. female who complains of  A fall on 05/31/13 with a right periprosthetic femur fractuyre and ORIF. She was at snf and developed wound issues with prominent hardware.  Past Medical History  Diagnosis Date  . Hypertension   . Diabetes mellitus without complication   . Stroke   . A-fib    Past Surgical History  Procedure Laterality Date  . Joint replacement     History   Social History  . Marital Status: Widowed    Spouse Name: N/A    Number of Children: N/A  . Years of Education: N/A   Social History Main Topics  . Smoking status: Never Smoker   . Smokeless tobacco: None  . Alcohol Use: No  . Drug Use: No  . Sexual Activity: None   Other Topics Concern  . None   Social History Narrative  . None   No family history on file. No Known Allergies Prior to Admission medications   Medication Sig Start Date End Date Taking? Authorizing Provider  acetaminophen (TYLENOL) 325 MG tablet Take 650 mg by mouth every 6 (six) hours as needed for mild pain.   Yes Historical Provider, MD  atenolol (TENORMIN) 50 MG tablet Take 50 mg by mouth daily.   Yes Historical Provider, MD  cephALEXin (KEFLEX) 500 MG capsule Take 500 mg by mouth 4 (four) times daily. For 10 days starting 06/14/13   Yes Historical Provider, MD  docusate sodium (COLACE) 100 MG capsule Take 100 mg by mouth 2 (two) times daily.   Yes Historical Provider, MD  insulin detemir (LEVEMIR) 100 UNIT/ML injection Inject 15 Units into the skin 2 (two) times daily.   Yes Historical Provider, MD  insulin glulisine (APIDRA) 100 UNIT/ML injection Inject 5 Units into the skin 3 (three) times daily before meals.   Yes Historical Provider, MD  iron polysaccharides (NIFEREX) 150 MG capsule Take 150 mg by mouth daily.   Yes Historical Provider, MD  latanoprost (XALATAN)  0.005 % ophthalmic solution Place 1 drop into both eyes at bedtime.   Yes Historical Provider, MD  methocarbamol (ROBAXIN) 500 MG tablet Take 500 mg by mouth 3 (three) times daily as needed for muscle spasms.   Yes Historical Provider, MD  morphine 4 MG/ML injection Inject 4 mg into the vein every 2 (two) hours as needed for pain.   Yes Historical Provider, MD  nitroGLYCERIN (NITROSTAT) 0.4 MG SL tablet Place 0.4 mg under the tongue every 5 (five) minutes as needed for chest pain.   Yes Historical Provider, MD  omeprazole (PRILOSEC) 20 MG capsule Take 20 mg by mouth daily.   Yes Historical Provider, MD  ondansetron (ZOFRAN) 4 MG/2ML SOLN injection Inject 4 mg into the vein every 8 (eight) hours as needed for nausea or vomiting.   Yes Historical Provider, MD  oxyCODONE-acetaminophen (PERCOCET/ROXICET) 5-325 MG per tablet Take 1 tablet by mouth every 4 (four) hours as needed for severe pain.   Yes Historical Provider, MD  warfarin (COUMADIN) 5 MG tablet Take 5 mg by mouth at bedtime.   Yes Historical Provider, MD   Dg Knee Complete 4 Views Right  06/16/2013   CLINICAL DATA:  Fall, recent femoral fixation, presumably at an outside institution  EXAM: RIGHT KNEE - COMPLETE 4+ VIEW  COMPARISON:  No similar prior exam is available at this  institution for comparison or on [facility].  FINDINGS: There is evidence of sideplate, screw, and cerclage wire fixation of a comminuted distal right femoral fracture. There has been apparent backing out of the distal femoral screw component with its head protruding from the skin. Comminuted fracture fragments are again noted with 1 full shaft width fracture fragment overlap. Total knee arthroplasty noted and presumed femoral component of total hip arthroplasty partly visualized. Surgical staples are in place.  IMPRESSION: Lateral displacement of femoral fracture fixation hardware indicating hardware failure, with protrusion of a surgical screw through the skin.  Comminuted  distal femoral fracture with 1 full shaft width overlap of the fracture fragments.   Electronically Signed   By: Christiana Pellant M.D.   On: 06/16/2013 15:21    Positive ROS: All other systems have been reviewed and were otherwise negative with the exception of those mentioned in the HPI and as above.  Labs cbc  Recent Labs  06/16/13 1411  WBC 7.6  HGB 11.5*  HCT 36.5  PLT 510*    Labs inflam No results found for this basename: ESR, CRP,  in the last 72 hours  Labs coag No results found for this basename: INR, PT, PTT,  in the last 72 hours   Recent Labs  06/16/13 1411  NA 142  K 4.6  CL 108  CO2 28  GLUCOSE 58*  BUN 19  CREATININE 0.61  CALCIUM 9.2    Physical Exam: Filed Vitals:   06/16/13 1516  BP: 115/59  Pulse: 82  Temp:   Resp: 20   General: Alert, no acute distress Cardiovascular: No pedal edema Respiratory: No cyanosis, no use of accessory musculature GI: No organomegaly, abdomen is soft and non-tender Skin: No lesions in the area of chief complaint Neurologic: Sensation intact distally Psychiatric: Patient is competent for consent with normal mood and affect Lymphatic: No axillary or cervical lymphadenopathy  MUSCULOSKELETAL:  RLE: Staples in a lateral incision mostly benign with an area distally of wound seperation and prominent screw head. There is surroundin erythema at  This region but no purulent drainaige and min serous drainage SILT DP/SP/S/S/T nerve, 2+ DP, +TA/GS/EHL Compartments soft Other extremities are atraumatic with painless ROM and NVI.  Assessment: Failed hardware and distal femur periprosthetic fracture.   Plan: I&D with revision ORIF 12/11 I am hopefull that there is not a deep seated infection but ID is involved in case long term abx are needed I will send deep cultures Monitor for s/sx of c. diff Weight Bearing Status: NWB RLE No ROM RLE knee PT/OT VTE px: SCD's and chemical post op   Margarita Rana, D, MD Cell  [phone]   06/16/2013 3:45 PM

## 2013-06-17 NOTE — Progress Notes (Signed)
Utilization review completed.  

## 2013-06-17 NOTE — Op Note (Signed)
06/16/2013 - 06/17/2013  9:07 AM  PATIENT:  Tammy Sharp    PRE-OPERATIVE DIAGNOSIS:  Right Distal Femur Fracture  POST-OPERATIVE DIAGNOSIS:  Same  PROCEDURE:  OPEN REDUCTION INTERNAL FIXATION (ORIF) DISTAL FEMUR FRACTURE  SURGEON:  Margarita Rana, D, MD  ASSISTANT: Janace Litten OPA  ANESTHESIA:   Gen  PREOPERATIVE INDICATIONS:  Tammy Sharp is a  79 y.o. female with a diagnosis of Right Distal Femur Fracture who failed conservative measures and elected for surgical management.    The risks benefits and alternatives were discussed with the patient preoperatively including but not limited to the risks of infection, bleeding, nerve injury, cardiopulmonary complications, the need for revision surgery, among others, and the patient was willing to proceed.  OPERATIVE IMPLANTS: Stryker dist fem locking plate  OPERATIVE FINDINGS: non union with failed hardware  BLOOD LOSS: 500  COMPLICATIONS: none  TOURNIQUET TIME: none  OPERATIVE PROCEDURE:  Patient was identified in the preoperative holding area and site was marked by me He was transported to the operating theater and placed on the table in supine position taking care to pad all bony prominences. After a preincinduction time out anesthesia was induced. The Right lower extremity was prepped and draped in normal sterile fashion and a pre-incision timeout was performed. Tammy Sharp received Ancef for preoperative antibiotics.   I started by removing all of the staples from her previous incision. I was unable to spread this apart down the fascia which had been sewn shut with a Vicryl. I remove this Vicryl and was able to bluntly opener fascial incision again. Medially able to visualize the distal nonlocking screw that had out of the skin. There was some serous drainage from around it and some small erythema but I did not encounter any purulence and nothing that looked infectious to me.  I cut the cables that had  previously been placed and removed all of the previous fracture hardware. I elevated the soft tissue off of the lateral condyle right up to the edge of the total knee components and visualize the proximal shaft there was intact. I selected a medium length periarticular axis locking plate and pinned it into place on the distal articular piece of the fracture. I took multiple views and was happy with the alignment. I then placed 5 locking screws in the condylar portion of the fracture. As happy with the placement of all of these. I then reduced the plate to the proximal bone while pulling some traction to maintain length. I placed a cerclage wire around the plate as this was at the longstem of the femoral prosthesis. I also placed a lag screw just below the femoral stem. I took multiple views he was in a slight amount of valgus but I felt this was acceptable flexion and extension was appropriate as well.  I then placed 3 more Dall-Miles cables around the proximal stable piece of bone. I then thoroughly debrided and irrigated the fracture because of the open wound. I then closed the IT band with a monofilament suture and closed the skin with a monofilament suture as well. A sterile dressing was placed and she was placed in a knee immobilizer and taken the PACU in stable condition.  POST OPERATIVE PLAN: She will be nonweightbearing with no range of motion in the knee immobilizer full time to this right lower extremity. I will place her and restart Coumadin with her previously started heparin.

## 2013-06-17 NOTE — Anesthesia Preprocedure Evaluation (Signed)
Anesthesia Evaluation  Patient identified by MRN, date of birth, ID band Patient awake    Reviewed: Allergy & Precautions, H&P , NPO status , Patient's Chart, lab work & pertinent test results  Airway Mallampati: II  Neck ROM: full    Dental   Pulmonary neg pulmonary ROS,          Cardiovascular hypertension, + dysrhythmias Atrial Fibrillation     Neuro/Psych    GI/Hepatic   Endo/Other  diabetes, Type 2  Renal/GU      Musculoskeletal   Abdominal   Peds  Hematology   Anesthesia Other Findings   Reproductive/Obstetrics                           Anesthesia Physical Anesthesia Plan  ASA: III  Anesthesia Plan: General and Regional   Post-op Pain Management: MAC Combined w/ Regional for Post-op pain   Induction: Intravenous  Airway Management Planned: LMA  Additional Equipment:   Intra-op Plan:   Post-operative Plan:   Informed Consent: I have reviewed the patients History and Physical, chart, labs and discussed the procedure including the risks, benefits and alternatives for the proposed anesthesia with the patient or authorized representative who has indicated his/her understanding and acceptance.     Plan Discussed with: CRNA, Anesthesiologist and Surgeon  Anesthesia Plan Comments:         Anesthesia Quick Evaluation

## 2013-06-17 NOTE — Progress Notes (Signed)
MD on call K.Schorr was notified about heparin drip and patient going to surgery at 0700. MD spoke with pharmacy and orders given to stop heparin drip around 4am. Heparin drip stopped at 0430. Preop bath, vs, and cbg completed this am prior to surgery. OR arrived at 0550 for pickup.

## 2013-06-17 NOTE — Progress Notes (Signed)
[facility] PROGRESS NOTE  Tammy Sharp ZOX:[id number] DOB: 12-09-1924 DOA: 06/16/2013 PCP: Pcp Not In System  Assessment/Plan: 1. Infection of the recent surgical correction of distal femoral shaft fracture - Lateral displacement of femoral fracture fixation hardware indicating hardware failure, with protrusion of a surgical screw through the skin. Comminuted distal femoral fracture with 1 full shaft width overlap of the fracture fragments.  - Vanc and cefepime per ID recs - cultures pending - discussed with Dr. Eulah Pont - no signs of active infection seen during surgery  2. Diabetes mellitus type 2 - Presenting hypoglycemia, was continued on D5 overnight - Encourage PO as tolerated  3. Hypertension.  - Stable continued on home dose beta blocker.   4. Atrial fibrillation. - Goal will be rate control, oral and IV beta blocker regimen as above.  - Coumadin currently held - Continue heparin bridge if/when OK with surgery  5. E.coli UTI - Cont current abx  Code Status: Full Family Communication: Pt in room (indicate person spoken with, relationship, and if by phone, the number) Disposition Planning: Pending  Consultants:  Orthopedic Surgery  ID  Procedures: OPEN REDUCTION INTERNAL FIXATION (ORIF) DISTAL FEMUR FRACTURE 06/17/13  Antibiotics:  Vancomycin 06/16/13>>>  Cefepime 06/16/13>>>  Cefazolin (post-op abx)  HPI/Subjective: No acute events noted overnight  Objective: Filed Vitals:   06/17/13 1100 06/17/13 1113 06/17/13 1115 06/17/13 1121  BP: 123/64  118/93   Pulse: 102 73 70   Temp:    97.2 F (36.2 C)  TempSrc:      Resp: 18 19 15    Height:      Weight:      SpO2: 95% 99% 100%     Intake/Output Summary (Last 24 hours) at 06/17/13 1234 Last data filed at 06/17/13 1120  Gross per 24 hour  Intake   1925 ml  Output    750 ml  Net   1175 ml   Filed Weights   06/16/13 1402 06/16/13 1629 06/17/13 0438  Weight: 86.183 kg (190 lb) 86.2 kg  (190 lb 0.6 oz) 91 kg (200 lb 9.9 oz)    Exam:   General:  Awake, in nad  Cardiovascular: regular, s1, s2  Respiratory: normal resp effort, no wheezing  Abdomen: soft, nondistneded  Musculoskeletal: perfused, no clubbing   Data Reviewed: Basic Metabolic Panel:  Recent Labs Lab 06/13/13 1424 06/16/13 1411 06/16/13 2225  NA 145 142 142  K 3.8 4.6 3.7  CL 109 108 107  CO2 29 28 28   GLUCOSE 84 58* 175*  BUN 19 19 18   CREATININE 0.80 0.61 0.69  CALCIUM 9.0 9.2 8.8   Liver Function Tests: No results found for this basename: AST, ALT, ALKPHOS, BILITOT, PROT, ALBUMIN,  in the last 168 hours No results found for this basename: LIPASE, AMYLASE,  in the last 168 hours No results found for this basename: AMMONIA,  in the last 168 hours CBC:  Recent Labs Lab 06/13/13 1424 06/16/13 1411 06/16/13 2225  WBC 8.4 7.6 7.9  NEUTROABS 6.3 5.2  --   HGB 11.3* 11.5* 11.1*  HCT 35.8* 36.5 34.8*  MCV 100.3* 101.1* 100.6*  PLT 535* 510* 500*   Cardiac Enzymes: No results found for this basename: CKTOTAL, CKMB, CKMBINDEX, TROPONINI,  in the last 168 hours BNP (last 3 results) No results found for this basename: PROBNP,  in the last 8760 hours CBG:  Recent Labs Lab 06/16/13 2050 06/16/13 2229 06/17/13 0412 06/17/13 0540 06/17/13 0944  GLUCAP 109* 160* 87 117* 136*  Recent Results (from the past 240 hour(s))  URINE CULTURE     Status: None   Collection Time    06/13/13  3:30 PM      Result Value Range Status   Specimen Description URINE, RANDOM   Final   Special Requests NONE   Final   Culture  Setup Time     Final   Value: 06/13/2013 20:18     Performed at [facility] Count     Final   Value: >=100,000 COLONIES/ML     Performed at [facility]   Culture     Final   Value: ESCHERICHIA COLI     Performed at [facility]   Report Status 06/15/2013 FINAL   Final   Organism ID, Bacteria ESCHERICHIA COLI   Final  SURGICAL PCR  SCREEN     Status: None   Collection Time    06/17/13  4:36 AM      Result Value Range Status   MRSA, PCR NEGATIVE  NEGATIVE Final   Staphylococcus aureus NEGATIVE  NEGATIVE Final   Comment:            The Xpert SA Assay (FDA     approved for NASAL specimens     in patients over 1 years of age),     is one component of     a comprehensive surveillance     program.  Test performance has     been validated by [employer] for patients greater     than or equal to 83 year old.     It is not intended     to diagnose infection nor to     guide or monitor treatment.     Studies: Dg Femur Right  06/17/2013   CLINICAL DATA:  Intra op right femur ORIF  EXAM: RIGHT FEMUR - 2 VIEW  COMPARISON:  06/16/2013  FINDINGS: A total right hip arthroplasty is appreciated with cerclage wiring along the proximal femur. The femoral component appears intact. Lateral buttress plate and cannulated screw fixation is identified along the distal femur with cerclage wiring. A total right knee arthroplasty is appreciated.  IMPRESSION: ORIF right femur   Electronically Signed   By: Salome Holmes M.D.   On: 06/17/2013 11:47   Dg Femur Right  06/16/2013   CLINICAL DATA:  Recent surgery, fall on 06/01/2013, screw poking os skin, draining pus and blood, history hypertension, diabetes  EXAM: RIGHT FEMUR - 2 VIEW  COMPARISON:  None  FINDINGS: Long stem of a right femoral prosthesis extends to the distal femur.  Components of the right knee prosthesis are identified in expected positions.  Lateral plate with cerclage wires and 4 distal screws identified post ORIF of a distal femoral diaphyseal fracture.  Fracture demonstrates mild apex lateral angulation and slight medial displacement.  Inferior most screw has withdrawn from the plate and extends to the skin surface.  A gap is present between the lateral plate and the distal femur.  Visualized right pelvis intact.  Soft tissue swelling and skin clips identified at the  distal thigh to the knee.  IMPRESSION: Prior right hip and knee replacements.  Prior ORIF of a distal femoral diaphyseal fracture with displacement of the dominant distal femoral fragment from the plate.  Inferior most screw extends has withdrawn from the plate and extends external to distal femoral soft tissues.   Electronically Signed   By: Angelyn Punt.D.  On: 06/16/2013 16:19   Dg Chest Port 1 View  06/17/2013   CLINICAL DATA:  Preop for right femoral fracture  EXAM: PORTABLE CHEST - 1 VIEW  COMPARISON:  None.  FINDINGS: Cardiomediastinal silhouette is unremarkable. Elevation of the right hemidiaphragm. Mild atherosclerotic calcifications of thoracic aorta. No acute infiltrate or pulmonary edema.  IMPRESSION: No active disease.   Electronically Signed   By: Natasha Mead M.D.   On: 06/17/2013 08:14   Dg Knee Complete 4 Views Right  06/16/2013   CLINICAL DATA:  Fall, recent femoral fixation, presumably at an outside institution  EXAM: RIGHT KNEE - COMPLETE 4+ VIEW  COMPARISON:  No similar prior exam is available at this institution for comparison or on [facility] PACS.  FINDINGS: There is evidence of sideplate, screw, and cerclage wire fixation of a comminuted distal right femoral fracture. There has been apparent backing out of the distal femoral screw component with its head protruding from the skin. Comminuted fracture fragments are again noted with 1 full shaft width fracture fragment overlap. Total knee arthroplasty noted and presumed femoral component of total hip arthroplasty partly visualized. Surgical staples are in place.  IMPRESSION: Lateral displacement of femoral fracture fixation hardware indicating hardware failure, with protrusion of a surgical screw through the skin.  Comminuted distal femoral fracture with 1 full shaft width overlap of the fracture fragments.   Electronically Signed   By: Christiana Pellant M.D.   On: 06/16/2013 15:21   Dg C-arm 1-60 Min  06/17/2013   CLINICAL DATA:   Hardware removal.  EXAM: DG C-ARM 1-60 MIN  TECHNIQUE: Six C-arm views submitted for review after procedure  COMPARISON:  06/16/2013.  FLUOROSCOPY TIME:  40 seconds  FINDINGS: Long stem right total hip prosthesis with proximal distal cerclage wires in place. Long segment sideplate distal femur. The distal aspect of the sideplate has been reapproximated with the distal femoral fracture with screws placed. Post right knee replacement. These findings can be assessed on follow up.  IMPRESSION: Long stem right total hip prosthesis with proximal distal cerclage wires in place.  Long segment sideplate distal femur. The distal aspect of the sideplate has been reapproximated with the distal femoral fracture with screws placed.  Post right knee replacement.  These findings can be assessed on follow up.   Electronically Signed   By: Bridgett Larsson M.D.   On: 06/17/2013 12:24    Scheduled Meds: . atenolol  50 mg Oral Daily  .  ceFAZolin (ANCEF) IV  2 g Intravenous Q6H  . docusate sodium  100 mg Oral BID  . insulin aspart  0-9 Units Subcutaneous TID WC  . latanoprost  1 drop Both Eyes QHS  . pantoprazole  40 mg Oral Daily  . sodium chloride  3 mL Intravenous Q12H   Continuous Infusions: . dextrose 5 % and 0.45% NaCl    . dextrose 5 % and 0.45% NaCl 75 mL/hr at 06/17/13 1228  . heparin 1,100 Units/hr (06/16/13 2332)    Principal Problem:   Infected prosthetic knee joint Active Problems:   Post op infection   A-fib   CVA (cerebral infarction)   HTN (hypertension)   DM2 (diabetes mellitus, type 2)  Time spent:  CHIU, STEPHEN K  [facility] Pager [phone]. If 7PM-7AM, please contact night-coverage at [email or web address], password [facility] 06/17/2013, 12:34 PM  LOS: 1 day

## 2013-06-17 NOTE — Transfer of Care (Signed)
Immediate Anesthesia Transfer of Care Note  Patient: Tammy Sharp  Procedure(s) Performed: Procedure(s): OPEN REDUCTION INTERNAL FIXATION (ORIF) DISTAL FEMUR FRACTURE (Right)  Patient Location: PACU  Anesthesia Type:General  Level of Consciousness: sedated  Airway & Oxygen Therapy: Patient Spontanous Breathing and Patient connected to nasal cannula oxygen  Post-op Assessment: Report given to PACU RN and Post -op Vital signs reviewed and stable  Post vital signs: Reviewed and stable  Complications: No apparent anesthesia complications

## 2013-06-17 NOTE — ED Provider Notes (Signed)
Medical screening examination/treatment/procedure(s) were performed by non-physician practitioner and as supervising physician I was immediately available for consultation/collaboration.  EKG Interpretation   None        Ethelda Chick, MD 06/17/13 1504

## 2013-06-17 NOTE — Progress Notes (Addendum)
ANTICOAGULATION CONSULT NOTE - Follow Up Consult  Pharmacy Consult for Heparin and Coumadin Indication: atrial fibrillation and VTE prophylaxis (s/p orthopedic surgery)  No Known Allergies  Patient Measurements: Height: 5' 6.93" (170 cm) Weight: 200 lb 9.9 oz (91 kg) IBW/kg (Calculated) : 61.44 Heparin Dosing Weight: 80 kg  Vital Signs: Temp: 97.2 F (36.2 C) (12/11 1121) BP: 118/93 mmHg (12/11 1115) Pulse Rate: 70 (12/11 1115)  Labs:  Recent Labs  06/16/13 1411 06/16/13 1855 06/16/13 2225 06/17/13 0357  HGB 11.5*  --  11.1*  --   HCT 36.5  --  34.8*  --   PLT 510*  --  500*  --   LABPROT  --  21.2* 19.8* 21.7*  INR  --  1.90* 1.74* 1.96*  HEPARINUNFRC  --   --   --  <0.10*  CREATININE 0.61  --  0.69  --     Estimated Creatinine Clearance: 58.3 ml/min (by C-G formula based on Cr of 0.69).   Medications:  Prescriptions prior to admission  Medication Sig Dispense Refill  . acetaminophen (TYLENOL) 325 MG tablet Take 650 mg by mouth every 6 (six) hours as needed for mild pain.      Marland Kitchen atenolol (TENORMIN) 50 MG tablet Take 50 mg by mouth daily.      . cephALEXin (KEFLEX) 500 MG capsule Take 500 mg by mouth 4 (four) times daily. For 10 days starting 06/14/13      . docusate sodium (COLACE) 100 MG capsule Take 100 mg by mouth 2 (two) times daily.      . insulin detemir (LEVEMIR) 100 UNIT/ML injection Inject 15 Units into the skin 2 (two) times daily.      . insulin glulisine (APIDRA) 100 UNIT/ML injection Inject 5 Units into the skin 3 (three) times daily before meals.      . iron polysaccharides (NIFEREX) 150 MG capsule Take 150 mg by mouth daily.      Marland Kitchen latanoprost (XALATAN) 0.005 % ophthalmic solution Place 1 drop into both eyes at bedtime.      . methocarbamol (ROBAXIN) 500 MG tablet Take 500 mg by mouth 3 (three) times daily as needed for muscle spasms.      Marland Kitchen morphine 4 MG/ML injection Inject 4 mg into the vein every 2 (two) hours as needed for pain.      .  nitroGLYCERIN (NITROSTAT) 0.4 MG SL tablet Place 0.4 mg under the tongue every 5 (five) minutes as needed for chest pain.      Marland Kitchen omeprazole (PRILOSEC) 20 MG capsule Take 20 mg by mouth daily.      . ondansetron (ZOFRAN) 4 MG/2ML SOLN injection Inject 4 mg into the vein every 8 (eight) hours as needed for nausea or vomiting.      Marland Kitchen oxyCODONE-acetaminophen (PERCOCET/ROXICET) 5-325 MG per tablet Take 1 tablet by mouth every 4 (four) hours as needed for severe pain.      Marland Kitchen warfarin (COUMADIN) 5 MG tablet Take 5 mg by mouth at bedtime.       Scheduled:  . atenolol  50 mg Oral Daily  .  ceFAZolin (ANCEF) IV  2 g Intravenous Q6H  . docusate sodium  100 mg Oral BID  . insulin aspart  0-9 Units Subcutaneous TID WC  . latanoprost  1 drop Both Eyes QHS  . pantoprazole  40 mg Oral Daily  . sodium chloride  3 mL Intravenous Q12H   Infusions:  . dextrose 5 % and 0.45% NaCl    .  dextrose 5 % and 0.45% NaCl 75 mL/hr at 06/17/13 1228  . heparin 1,100 Units/hr (06/16/13 2332)    Assessment: 79 yo F (with hx R THA and R TKR) who fell before TG and fractured her distal femoral shaft.  Pt s/p ORIF 11/26 (in [place]) and transferred to [facility] for rehab.  Noted to have wound dehiscence and serosanguineous drainage by rehab facility.  Presented to [facility] ER 12/10.  OR 12/11 for repair.  Patient was on heparin 1100 units/hr from 12/10 2330 - 12/11 0430 with AM heparin level <0.1.  Pt now post-op to restart heparin until INR >2.  INR 1.96 today, last Coumadin dose 12/9.  Also restarting Coumadin tonight.  Coumadin 5mg  daily PTA.   Goal of Therapy:  INR 2-3 Heparin level 0.3-0.7 units/ml Monitor platelets by anticoagulation protocol: Yes   Plan:  Restart heparin at 1250 units/hr.  Check 8hr heparin level. Coumadin 6mg  PO x 1 tonight. Daily heparin level, CBC, and INR.  [employer], Pharm.D., BCPS Clinical Pharmacist Pager [phone] 06/17/2013 12:54 PM   Also continuing Vancomycin and Cefepime post-op for  possible wound infection.  ID consulted.  Awaiting cx data.  Renal function stable will continue previous doses.  Vanc 750mg  IV q12h Cefepime 1gm IV q12h  [employer], Pharm.D., BCPS Clinical Pharmacist Pager [phone] 06/17/2013 2:29 PM

## 2013-06-17 NOTE — Anesthesia Postprocedure Evaluation (Signed)
Anesthesia Post Note  Patient: Tammy Sharp  Procedure(s) Performed: Procedure(s) (LRB): OPEN REDUCTION INTERNAL FIXATION (ORIF) DISTAL FEMUR FRACTURE (Right)  Anesthesia type: General  Patient location: PACU  Post pain: Pain level controlled and Adequate analgesia  Post assessment: Post-op Vital signs reviewed, Patient's Cardiovascular Status Stable, Respiratory Function Stable, Patent Airway and Pain level controlled  Last Vitals:  Filed Vitals:   06/17/13 1013  BP: 102/68  Pulse: 94  Temp:   Resp: 10    Post vital signs: Reviewed and stable  Level of consciousness: awake, alert  and oriented  Complications: No apparent anesthesia complications

## 2013-06-17 NOTE — Anesthesia Procedure Notes (Addendum)
Procedure Name: LMA Insertion Date/Time: 06/17/2013 7:42 AM Performed by: Orvilla Fus A Pre-anesthesia Checklist: Patient identified, Timeout performed, Emergency Drugs available, Suction available and Patient being monitored Patient Re-evaluated:Patient Re-evaluated prior to inductionOxygen Delivery Method: Circle system utilized Preoxygenation: Pre-oxygenation with 100% oxygen Intubation Type: IV induction Ventilation: Mask ventilation without difficulty LMA: LMA inserted LMA Size: 4.0 Number of attempts: 1 Placement Confirmation: positive ETCO2 and breath sounds checked- equal and bilateral Tube secured with: Tape Dental Injury: Teeth and Oropharynx as per pre-operative assessment    Anesthesia Regional Block:  Femoral nerve block  Pre-Anesthetic Checklist: ,, timeout performed, Correct Patient, Correct Site, Correct Laterality, Correct Procedure,, site marked, risks and benefits discussed, Surgical consent,  Pre-op evaluation,  At surgeon's request and post-op pain management  Laterality: Right  Prep: chloraprep       Needles:  Injection technique: Single-shot  Needle Type: Echogenic Stimulator Needle     Needle Length: 9cm  Needle Gauge: 21 and 21 G    Additional Needles:  Procedures: nerve stimulator Femoral nerve block  Nerve Stimulator or Paresthesia:  Response: Quadriceps muscle contraction, 0.45 mA,   Additional Responses:   Narrative:  Start time: 06/17/2013 7:04 AM End time: 06/17/2013 7:13 AM Injection made incrementally with aspirations every 5 mL.  Performed by: Personally  Anesthesiologist: Dr Chaney Malling  Additional Notes: Functioning IV was confirmed and monitors were applied.  A 90mm 21ga Arrow echogenic stimulator needle was used. Sterile prep and drape,hand hygiene and sterile gloves were used.  Negative aspiration and negative test dose prior to incremental administration of local anesthetic. The patient tolerated the procedure well.

## 2013-06-17 NOTE — Progress Notes (Signed)
OT Cancellation Note  Patient Details Name: Tammy Sharp MRN: [id number] DOB: 10/24/1924   Cancelled Treatment:    Reason Eval/Treat Not Completed: Other (comment) PT from SNF and to D/C back to SNF. Will defer any OT to SNF. Thanks [facility] Carely Nappier, OTR/L  [phone] 06/17/2013 06/17/2013, 3:49 PM

## 2013-06-18 ENCOUNTER — Telehealth (HOSPITAL_COMMUNITY): Payer: Self-pay | Admitting: Emergency Medicine

## 2013-06-18 ENCOUNTER — Inpatient Hospital Stay (HOSPITAL_COMMUNITY): Payer: Medicare Other

## 2013-06-18 DIAGNOSIS — I4891 Unspecified atrial fibrillation: Secondary | ICD-10-CM

## 2013-06-18 DIAGNOSIS — T84498A Other mechanical complication of other internal orthopedic devices, implants and grafts, initial encounter: Secondary | ICD-10-CM

## 2013-06-18 LAB — CBC
HCT: 29.9 % — ABNORMAL LOW (ref 36.0–46.0)
Hemoglobin: 9.8 g/dL — ABNORMAL LOW (ref 12.0–15.0)
MCV: 94.3 fL (ref 78.0–100.0)
RBC: 3.17 MIL/uL — ABNORMAL LOW (ref 3.87–5.11)
RDW: 17.4 % — ABNORMAL HIGH (ref 11.5–15.5)
WBC: 9.2 10*3/uL (ref 4.0–10.5)

## 2013-06-18 LAB — BASIC METABOLIC PANEL
BUN: 11 mg/dL (ref 6–23)
CO2: 17 mEq/L — ABNORMAL LOW (ref 19–32)
Chloride: 106 mEq/L (ref 96–112)
Creatinine, Ser: 0.53 mg/dL (ref 0.50–1.10)
Glucose, Bld: 157 mg/dL — ABNORMAL HIGH (ref 70–99)
Potassium: 4.4 mEq/L (ref 3.5–5.1)
Sodium: 136 mEq/L (ref 135–145)

## 2013-06-18 LAB — GLUCOSE, CAPILLARY
Glucose-Capillary: 151 mg/dL — ABNORMAL HIGH (ref 70–99)
Glucose-Capillary: 110 mg/dL — ABNORMAL HIGH (ref 70–99)
Glucose-Capillary: 113 mg/dL — ABNORMAL HIGH (ref 70–99)

## 2013-06-18 LAB — HEPARIN LEVEL (UNFRACTIONATED): Heparin Unfractionated: 0.49 IU/mL (ref 0.30–0.70)

## 2013-06-18 LAB — PROTIME-INR: INR: 1.72 — ABNORMAL HIGH (ref 0.00–1.49)

## 2013-06-18 MED ORDER — SODIUM CHLORIDE 0.9 % IJ SOLN
10.0000 mL | INTRAMUSCULAR | Status: DC | PRN
  Administered 2013-06-19 – 2013-06-20 (×2): 20 mL
  Administered 2013-06-20 – 2013-06-21 (×3): 10 mL

## 2013-06-18 MED ORDER — WARFARIN SODIUM 6 MG PO TABS
6.0000 mg | ORAL_TABLET | Freq: Once | ORAL | Status: AC
  Administered 2013-06-18: 6 mg via ORAL
  Filled 2013-06-18: qty 1

## 2013-06-18 NOTE — Progress Notes (Signed)
ANTICOAGULATION CONSULT NOTE - Follow Up Consult  Pharmacy Consult for heparin/coumadin Indication: afib and VTE prophylaxis  No Known Allergies  Patient Measurements: Height: 5' 6.93" (170 cm) Weight: 210 lb 12.2 oz (95.6 kg) IBW/kg (Calculated) : 61.44 Heparin Dosing Weight: 80 kg  Vital Signs: Temp: 97.4 F (36.3 C) (12/12 0525) BP: 123/49 mmHg (12/12 0525) Pulse Rate: 107 (12/12 0525)  Labs:  Recent Labs  06/16/13 2225 06/17/13 0357 06/17/13 1215 06/17/13 2125 06/18/13 0650  HGB 11.1*  --  10.5*  --  9.8*  HCT 34.8*  --  32.4*  --  29.9*  PLT 500*  --  PLATELETS APPEAR INCREASED  --  387  LABPROT 19.8* 21.7*  --   --  19.7*  INR 1.74* 1.96*  --   --  1.72*  HEPARINUNFRC  --  <0.10*  --  <0.10* 0.32  CREATININE 0.69  --  0.62  --  0.53    Estimated Creatinine Clearance: 59.8 ml/min (by C-G formula based on Cr of 0.53).   Medications:  Scheduled:  . atenolol  50 mg Oral Daily  . ceFEPime (MAXIPIME) IV  1 g Intravenous Q12H  . docusate sodium  100 mg Oral BID  . insulin aspart  0-9 Units Subcutaneous TID WC  . latanoprost  1 drop Both Eyes QHS  . pantoprazole  40 mg Oral Daily  . sodium chloride  3 mL Intravenous Q12H  . vancomycin (VANCOCIN) 750 mg IVPB  750 mg Intravenous Q12H  . warfarin  6 mg Oral ONCE-1800  . Warfarin - Pharmacist Dosing Inpatient   Does not apply q1800   Infusions:  . sodium chloride 75 mL/hr at 06/17/13 1609  . dextrose 5 % and 0.45% NaCl    . heparin 1,250 Units/hr (06/18/13 0411)    Assessment: 79 yo female with is currently on therapeutic heparin for afib and VTE prophylaxis s/p ortho surgery.  Heparin level is 0.32.  INR is 1.72.  She did not receive her coumadin last night. Goal of Therapy:  INR 2-3 Heparin level 0.3-0.7 units/ml Monitor platelets by anticoagulation protocol: Yes   Plan:  1) Recheck heparin to confirm therapeutic 2) Coumadin 6 mg po today 3) Daily heparin level, INR and CBC  Maxxon Schwanke  [place] 06/18/2013,12:58 PM

## 2013-06-18 NOTE — Progress Notes (Signed)
Patient ate 25% for dinner and is alert x2, calm and cooperative.  Nsg to continue to monitor for status changes.

## 2013-06-18 NOTE — Progress Notes (Signed)
I participated in the care of this patient and agree with the above history, physical and evaluation. I performed a review of the history and a physical exam as detailed   Chianna Spirito Daniel Lissandra Keil MD  

## 2013-06-18 NOTE — Evaluation (Signed)
Physical Therapy Evaluation Patient Details Name: Tammy Sharp MRN: [id number] DOB: [birth year] Today's Date: 06/18/2013 Time: 1139-1200 PT Time Calculation (min): 21 min  PT Assessment / Plan / Recommendation History of Present Illness   Infection of the recent surgical correction of distal femoral shaft fracture  Clinical Impression  Pt. Presents to PT with decreased memory and with dependencies in functional mobility and gait.  Is now NWB and due to cognitive status, doubt she willl be able to comply with NWB.  Expect her to be limited to transfers level for a time until she is cleared for increased weightbearing.  Needs acute PT to begin to address her mobility issues and will need SNF level of care for rehab post acute.      PT Assessment  Patient needs continued PT services    Follow Up Recommendations  SNF;Supervision/Assistance - 24 hour    Does the patient have the potential to tolerate intense rehabilitation      Barriers to Discharge        Equipment Recommendations  Other (comment) (TBD)    Recommendations for Other Services     Frequency Min 3X/week    Precautions / Restrictions Precautions Precautions: Other (comment);Fall (no ROM R knee) Precaution Comments: Pt. has had wrist restraints due to combativeness and confusion overnight Required Braces or Orthoses: Knee Immobilizer - Right Restrictions Weight Bearing Restrictions: Yes RLE Weight Bearing: Non weight bearing   Pertinent Vitals/Pain See vitals tab HR at rest in 120s and increased to 170 briefly while sitting up at EOB.  Pt. Assisted back to bed.  HR back down to 120s.      Mobility  Bed Mobility Bed Mobility: Supine to Sit;Sitting - Scoot to [place] of Bed;Sit to Supine Supine to Sit: 1: +2 Total assist;With rails;HOB flat Supine to Sit: Patient Percentage: 30% Sitting - Scoot to Edge of Bed: 1: +2 Total assist Sitting - Scoot to Edge of Bed: Patient Percentage: 60% Sit to Supine: 1: +2 Total  assist;HOB flat Sit to Supine: Patient Percentage: 0% Details for Bed Mobility Assistance: cues for safety and technique needed; 2 assist due to pain with movement and overall weakness Transfers Transfers: Not assessed Ambulation/Gait Ambulation/Gait Assistance: Not tested (comment)    Exercises     PT Diagnosis: Difficulty walking;Acute pain  PT Problem List: Decreased activity tolerance;Decreased mobility;Decreased balance;Decreased knowledge of use of DME;Decreased safety awareness;Decreased knowledge of precautions;Pain PT Treatment Interventions: DME instruction;Gait training;Functional mobility training;Therapeutic activities;Balance training;Patient/family education     PT Goals(Current goals can be found in the care plan section) Acute Rehab PT Goals Patient Stated Goal: pt. could not give goal other than to be released from wrist restraints PT Goal Formulation: Patient unable to participate in goal setting Time For Goal Achievement: 06/25/13 Potential to Achieve Goals: Fair  Visit Information  Last PT Received On: 06/18/13 Assistance Needed: +2 History of Present Illness: 1. Infection of the recent surgical correction of distal femoral shaft fracture       Prior Functioning  Home Living Additional Comments: pt. unable to provide response today and family not available Prior Function Level of Independence:  (TBD) Communication Communication: No difficulties    Cognition  Cognition Arousal/Alertness: Awake/alert (sleeping initially but awakened easily) Behavior During Therapy: WFL for tasks assessed/performed Overall Cognitive Status: No family/caregiver present to determine baseline cognitive functioning (RN reports combative, confused overnight) Memory: Decreased recall of precautions;Decreased short-term memory    Extremity/Trunk Assessment Upper Extremity Assessment Upper Extremity Assessment: Overall WFL for tasks  assessed Lower Extremity Assessment Lower  Extremity Assessment: RLE deficits/detail RLE: Unable to fully assess due to immobilization (and due to restrictions on ROM per MD)   Balance Balance Balance Assessed: Yes Static Sitting Balance Static Sitting - Balance Support: Bilateral upper extremity supported;Feet supported Static Sitting - Level of Assistance: 4: Min assist Static Sitting - Comment/# of Minutes: 8  End of Session PT - End of Session Activity Tolerance: Patient tolerated treatment well;Patient limited by fatigue;Patient limited by pain Patient left: in bed;with call bell/phone within reach;with bed alarm set;with restraints reapplied Nurse Communication: Mobility status  GP     Ferman Hamming 06/18/2013, 2:03 PM Weldon Picking PT Acute Rehab Services [phone] [place] [phone]

## 2013-06-18 NOTE — ED Notes (Signed)
Post ED Visit - Positive Culture Follow-up ° °Culture report reviewed by antimicrobial stewardship pharmacist: °[] Wes Dulaney, Pharm.D., BCPS °[x] Jeremy Frens, Pharm.D., BCPS °[] Elizabeth Martin, Pharm.D., BCPS °[] Minh Pham, Pharm.D., BCPS, AAHIVP °[] Michelle Turner, Pharm.D., BCPS, AAHIVP ° °Positive urine culture °Treated with Keflex, organism sensitive to the same and no further patient follow-up is required at this time. ° °Tammy Sharp °06/18/2013, 9:58 AM ° ° °

## 2013-06-18 NOTE — Progress Notes (Signed)
[facility] PROGRESS NOTE  Poonam Woehrle Meritt ZOX:[id number] DOB: 06-21-1947 DOA: 06/16/2013 PCP: Pcp Not In System  Assessment/Plan: 1. Infection of the recent surgical correction of distal femoral shaft fracture - Lateral displacement of femoral fracture fixation hardware indicating hardware failure, with protrusion of a surgical screw through the skin. Comminuted distal femoral fracture with 1 full shaft width overlap of the fracture fragments.  - Vanc and cefepime per ID recs - cultures pending - discussed with Dr. Eulah Pont - no signs of active infection seen during surgery - Will order PICC per ID recs  2. Diabetes mellitus type 2 - Encourage PO as tolerated  3. Hypertension.  - Stable continued on home dose beta blocker.   4. Atrial fibrillation. - Goal will be rate control, oral and IV beta blocker regimen as above.  - Continue heparin bridge and consider resuming coumadin  5. E.coli UTI - Cont current abx  Code Status: Full Family Communication: Pt in room (indicate person spoken with, relationship, and if by phone, the number) Disposition Planning: Pending  Consultants:  Orthopedic Surgery  ID  Procedures: OPEN REDUCTION INTERNAL FIXATION (ORIF) DISTAL FEMUR FRACTURE 06/17/13  Antibiotics:  Vancomycin 06/16/13>>>  Cefepime 06/16/13>>>  Cefazolin (post-op abx)  HPI/Subjective: No acute events noted overnight  Objective: Filed Vitals:   06/17/13 1121 06/17/13 1303 06/17/13 2141 06/18/13 0525  BP:  101/56 113/47 123/49  Pulse:  86 115 107  Temp: 97.2 F (36.2 C) 97.9 F (36.6 C) 98.6 F (37 C) 97.4 F (36.3 C)  TempSrc:   Axillary   Resp:  16 16 16   Height:      Weight:    95.6 kg (210 lb 12.2 oz)  SpO2:  99% 97% 93%    Intake/Output Summary (Last 24 hours) at 06/18/13 1206 Last data filed at 06/18/13 0700  Gross per 24 hour  Intake 2127.5 ml  Output    650 ml  Net 1477.5 ml   Filed Weights   06/16/13 1629 06/17/13 0438 06/18/13 0525   Weight: 86.2 kg (190 lb 0.6 oz) 91 kg (200 lb 9.9 oz) 95.6 kg (210 lb 12.2 oz)    Exam:   General:  Awake, in nad  Cardiovascular: regular, s1, s2  Respiratory: normal resp effort, no wheezing  Abdomen: soft, nondistneded  Musculoskeletal: perfused, no clubbing   Data Reviewed: Basic Metabolic Panel:  Recent Labs Lab 06/13/13 1424 06/16/13 1411 06/16/13 2225 06/17/13 1215 06/18/13 0650  NA 145 142 142  --  136  K 3.8 4.6 3.7  --  4.4  CL 109 108 107  --  106  CO2 29 28 28   --  17*  GLUCOSE 84 58* 175*  --  157*  BUN 19 19 18   --  11  CREATININE 0.80 0.61 0.69 0.62 0.53  CALCIUM 9.0 9.2 8.8  --  8.4   Liver Function Tests: No results found for this basename: AST, ALT, ALKPHOS, BILITOT, PROT, ALBUMIN,  in the last 168 hours No results found for this basename: LIPASE, AMYLASE,  in the last 168 hours No results found for this basename: AMMONIA,  in the last 168 hours CBC:  Recent Labs Lab 06/13/13 1424 06/16/13 1411 06/16/13 2225 06/17/13 1215 06/18/13 0650  WBC 8.4 7.6 7.9 11.5* 9.2  NEUTROABS 6.3 5.2  --   --   --   HGB 11.3* 11.5* 11.1* 10.5* 9.8*  HCT 35.8* 36.5 34.8* 32.4* 29.9*  MCV 100.3* 101.1* 100.6* 97.9 94.3  PLT 535* 510* 500*  PLATELETS APPEAR INCREASED 387   Cardiac Enzymes: No results found for this basename: CKTOTAL, CKMB, CKMBINDEX, TROPONINI,  in the last 168 hours BNP (last 3 results) No results found for this basename: PROBNP,  in the last 8760 hours CBG:  Recent Labs Lab 06/17/13 1249 06/17/13 1600 06/17/13 2137 06/18/13 0634 06/18/13 1054  GLUCAP 107* 209* 151* 149* 128*    Recent Results (from the past 240 hour(s))  URINE CULTURE     Status: None   Collection Time    06/13/13  3:30 PM      Result Value Range Status   Specimen Description URINE, RANDOM   Final   Special Requests NONE   Final   Culture  Setup Time     Final   Value: 06/13/2013 20:18     Performed at [facility] Count     Final    Value: >=100,000 COLONIES/ML     Performed at [facility]   Culture     Final   Value: ESCHERICHIA COLI     Performed at [facility]   Report Status 06/15/2013 FINAL   Final   Organism ID, Bacteria ESCHERICHIA COLI   Final  CULTURE, BLOOD (ROUTINE X 2)     Status: None   Collection Time    06/16/13  6:35 PM      Result Value Range Status   Specimen Description BLOOD LEFT ARM   Final   Special Requests BOTTLES DRAWN AEROBIC ONLY 5CC   Final   Culture  Setup Time     Final   Value: 06/17/2013 00:41     Performed at [facility]   Culture     Final   Value:        BLOOD CULTURE RECEIVED NO GROWTH TO DATE CULTURE WILL BE HELD FOR 5 DAYS BEFORE ISSUING A FINAL NEGATIVE REPORT     Performed at [facility]   Report Status PENDING   Incomplete  CULTURE, BLOOD (ROUTINE X 2)     Status: None   Collection Time    06/16/13  6:45 PM      Result Value Range Status   Specimen Description BLOOD RIGHT HAND   Final   Special Requests BOTTLES DRAWN AEROBIC ONLY 5CC   Final   Culture  Setup Time     Final   Value: 06/17/2013 00:41     Performed at [facility]   Culture     Final   Value:        BLOOD CULTURE RECEIVED NO GROWTH TO DATE CULTURE WILL BE HELD FOR 5 DAYS BEFORE ISSUING A FINAL NEGATIVE REPORT     Performed at [facility]   Report Status PENDING   Incomplete  SURGICAL PCR SCREEN     Status: None   Collection Time    06/17/13  4:36 AM      Result Value Range Status   MRSA, PCR NEGATIVE  NEGATIVE Final   Staphylococcus aureus NEGATIVE  NEGATIVE Final   Comment:            The Xpert SA Assay (FDA     approved for NASAL specimens     in patients over 77 years of age),     is one component of     a comprehensive surveillance     program.  Test performance has     been validated by [employer] for patients  greater     than or equal to 65 year old.     It is not intended     to diagnose infection nor to     guide or monitor  treatment.     Studies: Dg Femur Right  06/17/2013   CLINICAL DATA:  Intra op right femur ORIF  EXAM: RIGHT FEMUR - 2 VIEW  COMPARISON:  06/16/2013  FINDINGS: A total right hip arthroplasty is appreciated with cerclage wiring along the proximal femur. The femoral component appears intact. Lateral buttress plate and cannulated screw fixation is identified along the distal femur with cerclage wiring. A total right knee arthroplasty is appreciated.  IMPRESSION: ORIF right femur   Electronically Signed   By: Salome Holmes M.D.   On: 06/17/2013 11:47   Dg Femur Right  06/16/2013   CLINICAL DATA:  Recent surgery, fall on 06/01/2013, screw poking os skin, draining pus and blood, history hypertension, diabetes  EXAM: RIGHT FEMUR - 2 VIEW  COMPARISON:  None  FINDINGS: Long stem of a right femoral prosthesis extends to the distal femur.  Components of the right knee prosthesis are identified in expected positions.  Lateral plate with cerclage wires and 4 distal screws identified post ORIF of a distal femoral diaphyseal fracture.  Fracture demonstrates mild apex lateral angulation and slight medial displacement.  Inferior most screw has withdrawn from the plate and extends to the skin surface.  A gap is present between the lateral plate and the distal femur.  Visualized right pelvis intact.  Soft tissue swelling and skin clips identified at the distal thigh to the knee.  IMPRESSION: Prior right hip and knee replacements.  Prior ORIF of a distal femoral diaphyseal fracture with displacement of the dominant distal femoral fragment from the plate.  Inferior most screw extends has withdrawn from the plate and extends external to distal femoral soft tissues.   Electronically Signed   By: Ulyses Southward M.D.   On: 06/16/2013 16:19   Dg Chest Port 1 View  06/17/2013   CLINICAL DATA:  Preop for right femoral fracture  EXAM: PORTABLE CHEST - 1 VIEW  COMPARISON:  None.  FINDINGS: Cardiomediastinal silhouette is unremarkable.  Elevation of the right hemidiaphragm. Mild atherosclerotic calcifications of thoracic aorta. No acute infiltrate or pulmonary edema.  IMPRESSION: No active disease.   Electronically Signed   By: Natasha Mead M.D.   On: 06/17/2013 08:14   Dg Knee Complete 4 Views Right  06/16/2013   CLINICAL DATA:  Fall, recent femoral fixation, presumably at an outside institution  EXAM: RIGHT KNEE - COMPLETE 4+ VIEW  COMPARISON:  No similar prior exam is available at this institution for comparison or on [facility] PACS.  FINDINGS: There is evidence of sideplate, screw, and cerclage wire fixation of a comminuted distal right femoral fracture. There has been apparent backing out of the distal femoral screw component with its head protruding from the skin. Comminuted fracture fragments are again noted with 1 full shaft width fracture fragment overlap. Total knee arthroplasty noted and presumed femoral component of total hip arthroplasty partly visualized. Surgical staples are in place.  IMPRESSION: Lateral displacement of femoral fracture fixation hardware indicating hardware failure, with protrusion of a surgical screw through the skin.  Comminuted distal femoral fracture with 1 full shaft width overlap of the fracture fragments.   Electronically Signed   By: Christiana Pellant M.D.   On: 06/16/2013 15:21   Dg C-arm 1-60 Min  06/17/2013   CLINICAL DATA:  Hardware removal.  EXAM: DG C-ARM 1-60 MIN  TECHNIQUE: Six C-arm views submitted for review after procedure  COMPARISON:  06/16/2013.  FLUOROSCOPY TIME:  40 seconds  FINDINGS: Long stem right total hip prosthesis with proximal distal cerclage wires in place. Long segment sideplate distal femur. The distal aspect of the sideplate has been reapproximated with the distal femoral fracture with screws placed. Post right knee replacement. These findings can be assessed on follow up.  IMPRESSION: Long stem right total hip prosthesis with proximal distal cerclage wires in place.  Long  segment sideplate distal femur. The distal aspect of the sideplate has been reapproximated with the distal femoral fracture with screws placed.  Post right knee replacement.  These findings can be assessed on follow up.   Electronically Signed   By: Bridgett Larsson M.D.   On: 06/17/2013 12:24    Scheduled Meds: . atenolol  50 mg Oral Daily  . ceFEPime (MAXIPIME) IV  1 g Intravenous Q12H  . docusate sodium  100 mg Oral BID  . insulin aspart  0-9 Units Subcutaneous TID WC  . latanoprost  1 drop Both Eyes QHS  . pantoprazole  40 mg Oral Daily  . sodium chloride  3 mL Intravenous Q12H  . vancomycin (VANCOCIN) 750 mg IVPB  750 mg Intravenous Q12H  . warfarin  6 mg Oral ONCE-1800  . Warfarin - Pharmacist Dosing Inpatient   Does not apply q1800   Continuous Infusions: . sodium chloride 75 mL/hr at 06/17/13 1609  . dextrose 5 % and 0.45% NaCl    . heparin 1,250 Units/hr (06/18/13 0411)    Principal Problem:   Infected prosthetic knee joint Active Problems:   Post op infection   A-fib   CVA (cerebral infarction)   HTN (hypertension)   DM2 (diabetes mellitus, type 2)  Time spent:  CHIU, STEPHEN K  [facility] Pager [phone]. If 7PM-7AM, please contact night-coverage at [email or web address], password [facility] 06/18/2013, 12:06 PM  LOS: 2 days

## 2013-06-18 NOTE — Progress Notes (Signed)
Patient refused medications all morning long stating "I will take them if you let me out of these tie downs". PA aware.  Restraints still present, pt is restless.  Refusing to eat or drink.  Nsg to continue to monitor for status changes.

## 2013-06-18 NOTE — Progress Notes (Signed)
INFECTIOUS DISEASE PROGRESS NOTE  ID: Tammy Sharp is a 79 y.o. female with  Principal Problem:   Infected prosthetic knee joint Active Problems:   Post op infection   A-fib   CVA (cerebral infarction)   HTN (hypertension)   DM2 (diabetes mellitus, type 2)  Subjective: Awake all night, now sleeping all day post morphine for pain this AM.  Abtx:  Anti-infectives   Start     Dose/Rate Route Frequency Ordered Stop   06/17/13 1800  vancomycin (VANCOCIN) 750 mg in sodium chloride 0.9 % 150 mL IVPB     750 mg 150 mL/hr over 60 Minutes Intravenous Every 12 hours 06/17/13 1427     06/17/13 1400  ceFAZolin (ANCEF) IVPB 2 g/50 mL premix  Status:  Discontinued     2 g 100 mL/hr over 30 Minutes Intravenous Every 6 hours 06/17/13 1145 06/17/13 1351   06/17/13 1400  ceFEPIme (MAXIPIME) 1 g in dextrose 5 % 50 mL IVPB     1 g 100 mL/hr over 30 Minutes Intravenous Every 12 hours 06/17/13 1351     06/17/13 0600  ceFAZolin (ANCEF) IVPB 2 g/50 mL premix     2 g 100 mL/hr over 30 Minutes Intravenous On call to O.R. 06/16/13 2155 06/17/13 0744   06/17/13 0400  vancomycin (VANCOCIN) 750 mg in sodium chloride 0.9 % 150 mL IVPB  Status:  Discontinued     750 mg 150 mL/hr over 60 Minutes Intravenous Every 12 hours 06/16/13 1813 06/17/13 1211   06/16/13 1830  ceFEPIme (MAXIPIME) 1 g in dextrose 5 % 50 mL IVPB  Status:  Discontinued     1 g 100 mL/hr over 30 Minutes Intravenous Every 12 hours 06/16/13 1750 06/17/13 1211   06/16/13 1700  piperacillin-tazobactam (ZOSYN) IVPB 3.375 g     3.375 g 100 mL/hr over 30 Minutes Intravenous STAT 06/16/13 1645 06/16/13 1810   06/16/13 1600  vancomycin (VANCOCIN) IVPB 1000 mg/200 mL premix     1,000 mg 200 mL/hr over 60 Minutes Intravenous  Once 06/16/13 1545 06/16/13 1718      Medications:  Scheduled: . atenolol  50 mg Oral Daily  . ceFEPime (MAXIPIME) IV  1 g Intravenous Q12H  . docusate sodium  100 mg Oral BID  . insulin aspart  0-9 Units  Subcutaneous TID WC  . latanoprost  1 drop Both Eyes QHS  . pantoprazole  40 mg Oral Daily  . sodium chloride  3 mL Intravenous Q12H  . vancomycin (VANCOCIN) 750 mg IVPB  750 mg Intravenous Q12H  . warfarin  6 mg Oral ONCE-1800  . warfarin  6 mg Oral ONCE-1800  . Warfarin - Pharmacist Dosing Inpatient   Does not apply q1800    Objective: Vital signs in last 24 hours: Temp:  [97.4 F (36.3 C)-98.6 F (37 C)] 98.3 F (36.8 C) (12/12 1351) Pulse Rate:  [107-115] 109 (12/12 1351) Resp:  [16] 16 (12/12 1351) BP: (108-123)/(47-49) 108/49 mmHg (12/12 1351) SpO2:  [93 %-99 %] 99 % (12/12 1351) Weight:  [95.6 kg (210 lb 12.2 oz)] 95.6 kg (210 lb 12.2 oz) (12/12 0525)   General appearance: fatigued and no distress Resp: clear to auscultation bilaterally Cardio: regular rate and rhythm GI: normal findings: bowel sounds normal and soft, non-tender Extremities: RUE PIC. RLE wrapped.   Lab Results  Recent Labs  06/16/13 2225 06/17/13 1215 06/18/13 0650  WBC 7.9 11.5* 9.2  HGB 11.1* 10.5* 9.8*  HCT 34.8* 32.4* 29.9*  NA  142  --  136  K 3.7  --  4.4  CL 107  --  106  CO2 28  --  17*  BUN 18  --  11  CREATININE 0.69 0.62 0.53   Liver Panel No results found for this basename: PROT, ALBUMIN, AST, ALT, ALKPHOS, BILITOT, BILIDIR, IBILI,  in the last 72 hours Sedimentation Rate No results found for this basename: ESRSEDRATE,  in the last 72 hours C-Reactive Protein No results found for this basename: CRP,  in the last 72 hours  Microbiology: Recent Results (from the past 240 hour(s))  URINE CULTURE     Status: None   Collection Time    06/13/13  3:30 PM      Result Value Range Status   Specimen Description URINE, RANDOM   Final   Special Requests NONE   Final   Culture  Setup Time     Final   Value: 06/13/2013 20:18     Performed at [facility] Count     Final   Value: >=100,000 COLONIES/ML     Performed at [facility]   Culture     Final    Value: ESCHERICHIA COLI     Performed at [facility]   Report Status 06/15/2013 FINAL   Final   Organism ID, Bacteria ESCHERICHIA COLI   Final  CULTURE, BLOOD (ROUTINE X 2)     Status: None   Collection Time    06/16/13  6:35 PM      Result Value Range Status   Specimen Description BLOOD LEFT ARM   Final   Special Requests BOTTLES DRAWN AEROBIC ONLY 5CC   Final   Culture  Setup Time     Final   Value: 06/17/2013 00:41     Performed at [facility]   Culture     Final   Value:        BLOOD CULTURE RECEIVED NO GROWTH TO DATE CULTURE WILL BE HELD FOR 5 DAYS BEFORE ISSUING A FINAL NEGATIVE REPORT     Performed at [facility]   Report Status PENDING   Incomplete  CULTURE, BLOOD (ROUTINE X 2)     Status: None   Collection Time    06/16/13  6:45 PM      Result Value Range Status   Specimen Description BLOOD RIGHT HAND   Final   Special Requests BOTTLES DRAWN AEROBIC ONLY 5CC   Final   Culture  Setup Time     Final   Value: 06/17/2013 00:41     Performed at [facility]   Culture     Final   Value:        BLOOD CULTURE RECEIVED NO GROWTH TO DATE CULTURE WILL BE HELD FOR 5 DAYS BEFORE ISSUING A FINAL NEGATIVE REPORT     Performed at [facility]   Report Status PENDING   Incomplete  SURGICAL PCR SCREEN     Status: None   Collection Time    06/17/13  4:36 AM      Result Value Range Status   MRSA, PCR NEGATIVE  NEGATIVE Final   Staphylococcus aureus NEGATIVE  NEGATIVE Final   Comment:            The Xpert SA Assay (FDA     approved for NASAL specimens     in patients over 69 years of age),     is one component of  a comprehensive surveillance     program.  Test performance has     been validated by [facility] for patients greater     than or equal to 87 year old.     It is not intended     to diagnose infection nor to     guide or monitor treatment.    Studies/Results: Dg Femur Right  06-19-2013   CLINICAL DATA:  Intra op  right femur ORIF  EXAM: RIGHT FEMUR - 2 VIEW  COMPARISON:  06/16/2013  FINDINGS: A total right hip arthroplasty is appreciated with cerclage wiring along the proximal femur. The femoral component appears intact. Lateral buttress plate and cannulated screw fixation is identified along the distal femur with cerclage wiring. A total right knee arthroplasty is appreciated.  IMPRESSION: ORIF right femur   Electronically Signed   By: Salome Holmes M.D.   On: 06-19-2013 11:47   Dg Chest Port 1 View  06/18/2013   CLINICAL DATA:  PICC placement.  Infected prosthetic knee joint.  EXAM: PORTABLE CHEST - 1 VIEW  COMPARISON:  Chest x-ray dated 06/19/13  FINDINGS: PICC tip is in the superior vena cava in good position just below the carina. There is a 6 mm faint density overlying the right upper lung zone which I think represents a bone island in the right 2nd rib.  Heart size is normal. There is slight prominence of the main pulmonary artery.  No infiltrates or effusions.  IMPRESSION: PICC in good position.   Electronically Signed   By: Geanie Cooley M.D.   On: 06/18/2013 15:28   Dg Chest Port 1 View  06/19/13   CLINICAL DATA:  Preop for right femoral fracture  EXAM: PORTABLE CHEST - 1 VIEW  COMPARISON:  None.  FINDINGS: Cardiomediastinal silhouette is unremarkable. Elevation of the right hemidiaphragm. Mild atherosclerotic calcifications of thoracic aorta. No acute infiltrate or pulmonary edema.  IMPRESSION: No active disease.   Electronically Signed   By: Natasha Mead M.D.   On: 06-19-13 08:14   Dg C-arm 1-60 Min  06/19/2013   CLINICAL DATA:  Hardware removal.  EXAM: DG C-ARM 1-60 MIN  TECHNIQUE: Six C-arm views submitted for review after procedure  COMPARISON:  06/16/2013.  FLUOROSCOPY TIME:  40 seconds  FINDINGS: Long stem right total hip prosthesis with proximal distal cerclage wires in place. Long segment sideplate distal femur. The distal aspect of the sideplate has been reapproximated with the distal  femoral fracture with screws placed. Post right knee replacement. These findings can be assessed on follow up.  IMPRESSION: Long stem right total hip prosthesis with proximal distal cerclage wires in place.  Long segment sideplate distal femur. The distal aspect of the sideplate has been reapproximated with the distal femoral fracture with screws placed.  Post right knee replacement.  These findings can be assessed on follow up.   Electronically Signed   By: Bridgett Larsson M.D.   On: 2013/06/19 12:24     Assessment/Plan: Wound infection  Repeat ORIF R leg today  Hx of C diff  DM, hypoglycemia  UCx E coli (R-Amp)   Total days of antibiotics: 3 (vanco/cefepime)  Will continue her current anbx  PIC placed Note regarding no sign of infection in OR seen. Difficult to comment since exposed bone, or exposed prosthesis would typically equal infected bone/prosthesis. Will aim for short course of anbx 4 weeks.  FSG appear well controlled.  available if questions, will see her in ID  clinic for f/u         Johny Sax Infectious Diseases (pager) [phone] [email or web address] 06/18/2013, 4:13 PM  LOS: 2 days

## 2013-06-18 NOTE — Progress Notes (Signed)
PHARMACY CONSULT NOTE   Pharmacy Consult for :  Heparin Indication: atrial fibrillation, VTE prophylaxis   Dosing weight 80 kg  Labs:  Recent Labs  06/17/13 2125 06/18/13 0650 06/18/13 1650  HGB  --  9.8*  --   HCT  --  29.9*  --   PLT  --  387  --   INR  --  1.72*  --   HEPARINUNFRC <0.10* 0.32 0.49   Estimated Creatinine Clearance: 59.8 ml/min (by C-G formula based on Cr of 0.53).  Medications:  Infusions:  . HEPARIN 1,250 Units/hr (06/18/13 0411)   Assessment:  Heparin level within therapeutic range.  No bleeding complications noted   Goal of Therapy:   Heparin level 0.3-0.7 units/ml   Plan:  1. Continue Heparin at 1250 units/hr. Daily Heparin Levels, INR, CBC.  Monitor for bleeding complications.   Almando Brawley, Elisha Headland, Pharm.D. 06/18/2013, 5:49 PM

## 2013-06-18 NOTE — Progress Notes (Signed)
Patient ID: Tammy Sharp, female   DOB: 12/17/1924, 79 y.o.   MRN: [id number]     Subjective:  Patient reports pain as mild to moderate.  Patient confused but answers questions.  Not aware of time and place and does not remember her injury.  Objective:   VITALS:   Filed Vitals:   06/17/13 1121 06/17/13 1303 06/17/13 2141 06/18/13 0525  BP:  101/56 113/47 123/49  Pulse:  86 115 107  Temp: 97.2 F (36.2 C) 97.9 F (36.6 C) 98.6 F (37 C) 97.4 F (36.3 C)  TempSrc:   Axillary   Resp:  16 16 16   Height:      Weight:    95.6 kg (210 lb 12.2 oz)  SpO2:  99% 97% 93%    ABD soft Sensation intact distally Dorsiflexion/Plantar flexion intact Incision: dressing C/D/I and no drainage   Lab Results  Component Value Date   WBC 9.2 06/18/2013   HGB 9.8* 06/18/2013   HCT 29.9* 06/18/2013   MCV 94.3 06/18/2013   PLT 387 06/18/2013     Assessment/Plan: 1 Day Post-Op   Principal Problem:   Infected prosthetic knee joint Active Problems:   Post op infection   A-fib   CVA (cerebral infarction)   HTN (hypertension)   DM2 (diabetes mellitus, type 2)   Advance diet Continue plan per medicine NWB right lower ext NO ROM knee immobilizer at all times    Haskel Khan 06/18/2013, 12:10 PM   Renaye Rakers, MD [phone]

## 2013-06-18 NOTE — Progress Notes (Signed)
Restraints discontinued per verbal MD order.  Pt is quietly resting.  VS within normal limits.  Nsg to continue to monitor for status changes.

## 2013-06-18 NOTE — Progress Notes (Signed)
Patient is more alert this afternoon, can follow simple commands states "i'm so tired".  Has taken clear liquid fluids PO and tolerated well.

## 2013-06-18 NOTE — Progress Notes (Signed)
bs was obtained but machine is not downloading to epic.  CBG was 151

## 2013-06-18 NOTE — Progress Notes (Signed)
Patient needed pain medication, refused to swallow pills, PRN Morphine 1mg  IV administered. Pt tolerated well.

## 2013-06-18 NOTE — Progress Notes (Signed)
Peripherally Inserted Central Catheter/Midline Placement  The IV Nurse has discussed with the patient and/or persons authorized to consent for the patient, the purpose of this procedure and the potential benefits and risks involved with this procedure.  The benefits include less needle sticks, lab draws from the catheter and patient may be discharged home with the catheter.  Risks include, but not limited to, infection, bleeding, blood clot (thrombus formation), and puncture of an artery; nerve damage and irregular heat beat.  Alternatives to this procedure were also discussed.  PICC/Midline Placement Documentation  PICC / Midline Single Lumen 06/18/13 PICC Right Basilic 40 cm 0 cm (Active)  Indication for Insertion or Continuance of Line Home intravenous therapies (PICC only) 06/18/2013  2:00 PM  Exposed Catheter (cm) 0 cm 06/18/2013  2:00 PM  Dressing Change Due 06/25/13 06/18/2013  2:00 PM       Stacie Glaze Horton 06/18/2013, 2:53 PM

## 2013-06-18 NOTE — Progress Notes (Addendum)
[id number]:  Pt very confused.  Combative.  Attempting to get up.  States, "im not in hospital, im leaving."  Pulled out piv x 2.  Attempted to pull out foley catheter.  Notified Dr. Rhona Leavens of pt's current actions.  Order for bilateral wrist restraints ordered at 2012 and initiated.  Ativan ordered.  Will give once PIV is placed again.  Notified Dr. Rhona Leavens that pt refuses to take any pills and is missing coumadin dose.  Notified pharmacy that pt was off heparin gtt for approximately 2hr d/t pulling out iv and that she refused coumadin.  After starting piv x 2 again, gave Lorazepam at 2035 which seems to make pt calm down and become slightly more cooperative.  Notified Pt's son who states, "yeah. That's her.  Do whatever you have to do".

## 2013-06-19 LAB — CBC
HCT: 28.1 % — ABNORMAL LOW (ref 36.0–46.0)
Hemoglobin: 9 g/dL — ABNORMAL LOW (ref 12.0–15.0)
MCHC: 32 g/dL (ref 30.0–36.0)
Platelets: 333 10*3/uL (ref 150–400)
RDW: 17.3 % — ABNORMAL HIGH (ref 11.5–15.5)
WBC: 6.5 10*3/uL (ref 4.0–10.5)

## 2013-06-19 LAB — VANCOMYCIN, TROUGH: Vancomycin Tr: 11.5 ug/mL (ref 10.0–20.0)

## 2013-06-19 LAB — HEPARIN LEVEL (UNFRACTIONATED)
Heparin Unfractionated: 0.34 IU/mL (ref 0.30–0.70)
Heparin Unfractionated: 0.25 IU/mL — ABNORMAL LOW (ref 0.30–0.70)

## 2013-06-19 LAB — PROTIME-INR: INR: 1.64 — ABNORMAL HIGH (ref 0.00–1.49)

## 2013-06-19 LAB — GLUCOSE, CAPILLARY: Glucose-Capillary: 117 mg/dL — ABNORMAL HIGH (ref 70–99)

## 2013-06-19 MED ORDER — WARFARIN SODIUM 6 MG PO TABS
6.0000 mg | ORAL_TABLET | Freq: Once | ORAL | Status: AC
  Administered 2013-06-19: 6 mg via ORAL
  Filled 2013-06-19: qty 1

## 2013-06-19 MED ORDER — HEPARIN (PORCINE) IN NACL 100-0.45 UNIT/ML-% IJ SOLN
1500.0000 [IU]/h | INTRAMUSCULAR | Status: DC
  Administered 2013-06-19: 1350 [IU]/h via INTRAVENOUS
  Administered 2013-06-20: 1500 [IU]/h via INTRAVENOUS
  Filled 2013-06-19 (×3): qty 250

## 2013-06-19 MED ORDER — VANCOMYCIN HCL IN DEXTROSE 1-5 GM/200ML-% IV SOLN
1000.0000 mg | Freq: Two times a day (BID) | INTRAVENOUS | Status: DC
  Administered 2013-06-20 – 2013-06-21 (×3): 1000 mg via INTRAVENOUS
  Filled 2013-06-19 (×5): qty 200

## 2013-06-19 NOTE — Progress Notes (Signed)
Was called into pt's room d/t IV alarm, right hand PIV with Heparin drip occluded.  Paused IV and IV team called d/t pt has been difficult to start IV.  IV team arrived at 0805, new IV placed into left lateral forearm.  Heparin drip restarted. MD aware

## 2013-06-19 NOTE — Progress Notes (Addendum)
ANTIBIOTIC CONSULT NOTE - FOLLOW UP  Pharmacy Consult for vancomycin/cefepime Indication: infected prosthetic knee joint  No Known Allergies  Patient Measurements: Height: 5' 6.93" (170 cm) Weight: 201 lb 8 oz (91.4 kg) (Pt weighed yesterday @ 95.6kg but NT did not remove equipmen) IBW/kg (Calculated) : 61.44 Adjusted Body Weight: n/a  Vital Signs: Temp: 98.6 F (37 C) (12/13 1315) Temp src: Oral (12/13 1315) BP: 111/75 mmHg (12/13 1315) Pulse Rate: 116 (12/13 1315) Intake/Output from previous day: 12/12 0701 - 12/13 0700 In: 2338.8 [P.O.:170; I.V.:1868.8; IV Piggyback:300] Out: 1400 [Urine:1400] Intake/Output from this shift:    Labs:  Recent Labs  06/16/13 2225 06/17/13 1215 06/18/13 0650 06/19/13 0351  WBC 7.9 11.5* 9.2 6.5  HGB 11.1* 10.5* 9.8* 9.0*  PLT 500* PLATELETS APPEAR INCREASED 387 333  CREATININE 0.69 0.62 0.53  --    Estimated Creatinine Clearance: 58.5 ml/min (by C-G formula based on Cr of 0.53).  Recent Labs  06/19/13 1845  VANCOTROUGH 11.5     Microbiology: Recent Results (from the past 720 hour(s))  URINE CULTURE     Status: None   Collection Time    06/13/13  3:30 PM      Result Value Range Status   Specimen Description URINE, RANDOM   Final   Special Requests NONE   Final   Culture  Setup Time     Final   Value: 06/13/2013 20:18     Performed at [facility] Count     Final   Value: >=100,000 COLONIES/ML     Performed at [facility]   Culture     Final   Value: ESCHERICHIA COLI     Performed at [facility]   Report Status 06/15/2013 FINAL   Final   Organism ID, Bacteria ESCHERICHIA COLI   Final  CULTURE, BLOOD (ROUTINE X 2)     Status: None   Collection Time    06/16/13  6:35 PM      Result Value Range Status   Specimen Description BLOOD LEFT ARM   Final   Special Requests BOTTLES DRAWN AEROBIC ONLY 5CC   Final   Culture  Setup Time     Final   Value: 06/17/2013 00:41     Performed at  [facility]   Culture     Final   Value:        BLOOD CULTURE RECEIVED NO GROWTH TO DATE CULTURE WILL BE HELD FOR 5 DAYS BEFORE ISSUING A FINAL NEGATIVE REPORT     Performed at [facility]   Report Status PENDING   Incomplete  CULTURE, BLOOD (ROUTINE X 2)     Status: None   Collection Time    06/16/13  6:45 PM      Result Value Range Status   Specimen Description BLOOD RIGHT HAND   Final   Special Requests BOTTLES DRAWN AEROBIC ONLY 5CC   Final   Culture  Setup Time     Final   Value: 06/17/2013 00:41     Performed at [facility]   Culture     Final   Value:        BLOOD CULTURE RECEIVED NO GROWTH TO DATE CULTURE WILL BE HELD FOR 5 DAYS BEFORE ISSUING A FINAL NEGATIVE REPORT     Performed at [facility]   Report Status PENDING   Incomplete  SURGICAL PCR SCREEN     Status: None   Collection Time  06/17/13  4:36 AM      Result Value Range Status   MRSA, PCR NEGATIVE  NEGATIVE Final   Staphylococcus aureus NEGATIVE  NEGATIVE Final   Comment:            The Xpert SA Assay (FDA     approved for NASAL specimens     in patients over 19 years of age),     is one component of     a comprehensive surveillance     program.  Test performance has     been validated by [facility] for patients greater     than or equal to 22 year old.     It is not intended     to diagnose infection nor to     guide or monitor treatment.    Anti-infectives   Start     Dose/Rate Route Frequency Ordered Stop   06/20/13 0300  vancomycin (VANCOCIN) IVPB 1000 mg/200 mL premix     1,000 mg 200 mL/hr over 60 Minutes Intravenous Every 12 hours 06/19/13 2033     06/17/13 1800  vancomycin (VANCOCIN) 750 mg in sodium chloride 0.9 % 150 mL IVPB  Status:  Discontinued     750 mg 150 mL/hr over 60 Minutes Intravenous Every 12 hours 06/17/13 1427 06/19/13 2028   06/17/13 1400  ceFAZolin (ANCEF) IVPB 2 g/50 mL premix  Status:  Discontinued     2 g 100 mL/hr over 30 Minutes  Intravenous Every 6 hours 06/17/13 1145 06/17/13 1351   06/17/13 1400  ceFEPIme (MAXIPIME) 1 g in dextrose 5 % 50 mL IVPB     1 g 100 mL/hr over 30 Minutes Intravenous Every 12 hours 06/17/13 1351     06/17/13 0600  ceFAZolin (ANCEF) IVPB 2 g/50 mL premix     2 g 100 mL/hr over 30 Minutes Intravenous On call to O.R. 06/16/13 2155 06/17/13 0744   06/17/13 0400  vancomycin (VANCOCIN) 750 mg in sodium chloride 0.9 % 150 mL IVPB  Status:  Discontinued     750 mg 150 mL/hr over 60 Minutes Intravenous Every 12 hours 06/16/13 1813 06/17/13 1211   06/16/13 1830  ceFEPIme (MAXIPIME) 1 g in dextrose 5 % 50 mL IVPB  Status:  Discontinued     1 g 100 mL/hr over 30 Minutes Intravenous Every 12 hours 06/16/13 1750 06/17/13 1211   06/16/13 1700  piperacillin-tazobactam (ZOSYN) IVPB 3.375 g     3.375 g 100 mL/hr over 30 Minutes Intravenous STAT 06/16/13 1645 06/16/13 1810   06/16/13 1600  vancomycin (VANCOCIN) IVPB 1000 mg/200 mL premix     1,000 mg 200 mL/hr over 60 Minutes Intravenous  Once 06/16/13 1545 06/16/13 1718      Assessment: 79 yo female with infected prosthetic knee joint.  Planning to continue IV vancomycin and cefepime x 4 weeks.  Vancomycin trough level drawn ~ 1.25 hrs late tonight below goal at 11.5.  Scr stable.  Estimated CrCl ~ 60 ml/min.  Dose of 750 mg IV given after trough drawn.  Cefepime dosing appropriate for renal function.  Goal of Therapy:  Vancomycin trough level 15-20 mcg/ml  Plan:  1. Increase vancomycin to 1g IV q 12 hrs.  Next dose due around 3 AM tomorrow. 2. Will recheck vancomycin trough at next steady state as able. 3. Continue Cefepime 1g q 12 hrs.  Tad Moore, BCPS  Clinical Pharmacist Pager [phone]  06/19/2013 8:39 PM

## 2013-06-19 NOTE — Progress Notes (Signed)
Clinical Social Work Department BRIEF PSYCHOSOCIAL ASSESSMENT 06/19/2013  Patient:  Tammy Sharp, Tammy Sharp     Account Number:  [id number]     Admit date:  06/16/2013  Clinical Social Worker:  Hendricks Milo  Date/Time:  06/19/2013 12:28 PM  Referred by:  Physician  Date Referred:  06/18/2013 Referred for  SNF Placement   Other Referral:   Interview type:  Family Other interview type:    PSYCHOSOCIAL DATA Living Status:  FACILITY Admitted from facility:  [facility] Level of care:  Skilled Nursing Facility Primary support name:  Jeanifer Halliday [phone] Primary support relationship to patient:  CHILD, ADULT Degree of support available:   Very supportive. Patient lived with son in [place] prior to coming to [facility] for short term rehab.    CURRENT CONCERNS  Other Concerns:    SOCIAL WORK ASSESSMENT / PLAN Clinical Social Worker (CSW) contacted patient's son Shaniya Tashiro [phone] to complete assessment because per weekday handoff report patient is confused. Son reported that patient will return to [facility] and finish her short term rehab. Son stated that after patient is done at [facility] she will come back to her home and live with him in [place]. Son reported that patient lived with him in [place] and went to visit family in [place] over Thanksgiving and had a fall and then had surgery and went to [facility] for rehab to be close to her other son Leni Pankonin.   Assessment/plan status:  Psychosocial Support/Ongoing Assessment of Needs Other assessment/ plan:   Information/referral to community resources:    PATIENT'S/FAMILY'S RESPONSE TO PLAN OF CARE: Son thanked CSW for calling and helping patient get back to [facility].

## 2013-06-19 NOTE — Progress Notes (Signed)
ANTICOAGULATION CONSULT NOTE - Follow Up Consult  Pharmacy Consult for heparin Indication: afib and VTE prophylaxis  No Known Allergies  Patient Measurements: Height: 5' 6.93" (170 cm) Weight: 201 lb 8 oz (91.4 kg) (Pt weighed yesterday @ 95.6kg but NT did not remove equipmen) IBW/kg (Calculated) : 61.44 Heparin Dosing Weight: 80 kg  Vital Signs: Temp: 98.6 F (37 C) (12/13 1315) Temp src: Oral (12/13 1315) BP: 111/75 mmHg (12/13 1315) Pulse Rate: 116 (12/13 1315)  Labs:  Recent Labs  06/16/13 2225 06/17/13 0357 06/17/13 1215  06/18/13 0650 06/18/13 1650 06/19/13 0351 06/19/13 1845  HGB 11.1*  --  10.5*  --  9.8*  --  9.0*  --   HCT 34.8*  --  32.4*  --  29.9*  --  28.1*  --   PLT 500*  --  PLATELETS APPEAR INCREASED  --  387  --  333  --   LABPROT 19.8* 21.7*  --   --  19.7*  --  19.0*  --   INR 1.74* 1.96*  --   --  1.72*  --  1.64*  --   HEPARINUNFRC  --  <0.10*  --   < > 0.32 0.49 0.34 0.25*  CREATININE 0.69  --  0.62  --  0.53  --   --   --   < > = values in this interval not displayed.  Estimated Creatinine Clearance: 58.5 ml/min (by C-G formula based on Cr of 0.53).   Medications:  Scheduled:  . atenolol  50 mg Oral Daily  . ceFEPime (MAXIPIME) IV  1 g Intravenous Q12H  . docusate sodium  100 mg Oral BID  . insulin aspart  0-9 Units Subcutaneous TID WC  . latanoprost  1 drop Both Eyes QHS  . pantoprazole  40 mg Oral Daily  . sodium chloride  3 mL Intravenous Q12H  . vancomycin (VANCOCIN) 750 mg IVPB  750 mg Intravenous Q12H  . warfarin  6 mg Oral ONCE-1800  . Warfarin - Pharmacist Dosing Inpatient   Does not apply q1800   Infusions:  . sodium chloride 20 mL/hr at 06/19/13 1700  . dextrose 5 % and 0.45% NaCl    . heparin      Assessment: 79 yo female currently on therapeutic heparin for afib and VTE prophylaxis s/p ortho surgery.   Heparin level is 0.34 this am which is down from 0.49 yesterday evening. Per nurse, PIV occluded this am, heparin gtt  off x 30-45 mins, restarted at around 0800 with no other interruptions reported. INR is SUBtherapeutic at 1.64 after coumadin restart yesterday.  F/u heparin level is subtherapeutic at 0.25 this evening on heparin at 1250 units/hr.  Per RN has been infusing okay after an interruption this AM at ~ 8.  Patient having trouble maintaining a patent peripheral IV.  PTA coumadin dose 5 mg PO daily  Goal of Therapy:  INR 2-3 Heparin level 0.3-0.7 units/ml Monitor platelets by anticoagulation protocol: Yes   Plan:  1) Increase IV heparin to 1350 units/hr. 2) Will recheck heparin level with AM labs. 3) Continue daily heparin level and CBC.  Tad Moore, BCPS  Clinical Pharmacist Pager [phone]  06/19/2013 7:30 PM

## 2013-06-19 NOTE — Progress Notes (Signed)
ANTICOAGULATION CONSULT NOTE - Follow Up Consult  Pharmacy Consult for heparin/coumadin Indication: afib and VTE prophylaxis  No Known Allergies  Patient Measurements: Height: 5' 6.93" (170 cm) Weight: 201 lb 8 oz (91.4 kg) (Pt weighed yesterday @ 95.6kg but NT did not remove equipmen) IBW/kg (Calculated) : 61.44 Heparin Dosing Weight: 80 kg  Vital Signs: Temp: 97.9 F (36.6 C) (12/13 0552) BP: 113/70 mmHg (12/13 0552) Pulse Rate: 94 (12/13 0552)  Labs:  Recent Labs  06/16/13 2225 06/17/13 0357 06/17/13 1215  06/18/13 0650 06/18/13 1650 06/19/13 0351  HGB 11.1*  --  10.5*  --  9.8*  --  9.0*  HCT 34.8*  --  32.4*  --  29.9*  --  28.1*  PLT 500*  --  PLATELETS APPEAR INCREASED  --  387  --  333  LABPROT 19.8* 21.7*  --   --  19.7*  --  19.0*  INR 1.74* 1.96*  --   --  1.72*  --  1.64*  HEPARINUNFRC  --  <0.10*  --   < > 0.32 0.49 0.34  CREATININE 0.69  --  0.62  --  0.53  --   --   < > = values in this interval not displayed.  Estimated Creatinine Clearance: 58.5 ml/min (by C-G formula based on Cr of 0.53).   Medications:  Scheduled:  . atenolol  50 mg Oral Daily  . ceFEPime (MAXIPIME) IV  1 g Intravenous Q12H  . docusate sodium  100 mg Oral BID  . insulin aspart  0-9 Units Subcutaneous TID WC  . latanoprost  1 drop Both Eyes QHS  . pantoprazole  40 mg Oral Daily  . sodium chloride  3 mL Intravenous Q12H  . vancomycin (VANCOCIN) 750 mg IVPB  750 mg Intravenous Q12H  . Warfarin - Pharmacist Dosing Inpatient   Does not apply q1800   Infusions:  . sodium chloride 75 mL/hr at 06/19/13 0655  . dextrose 5 % and 0.45% NaCl    . heparin 1,250 Units/hr (06/19/13 1191)    Assessment: 79 yo female currently on therapeutic heparin for afib and VTE prophylaxis s/p ortho surgery.   Heparin level is 0.34 this am which is down from 0.49 yesterday evening. Per nurse, PIV occluded this am, heparin gtt off x 30-45 mins, restarted at around 0800 with no other interruptions  reported. INR is SUBtherapeutic at 1.64 after coumadin restart yesterday.  PTA coumadin dose 5 mg PO daily  Goal of Therapy:  INR 2-3 Heparin level 0.3-0.7 units/ml Monitor platelets by anticoagulation protocol: Yes   Plan:  1) Continue heparin gtt at 1250 units/hr 2) Recheck HL to confirm therapeutic 3) Coumadin 6 mg po today 4) Daily heparin level, INR and CBC   Margie Billet, PharmD Clinical Pharmacist - Resident Pager: [phone] Pharmacy: [phone] 06/19/2013 10:42 AM

## 2013-06-19 NOTE — Progress Notes (Signed)
Physical Therapy Treatment Patient Details Name: Tammy Sharp MRN: [id number] DOB: [birth year] Today's Date: 06/19/2013 Time: 2440-1027 PT Time Calculation (min): 24 min  PT Assessment / Plan / Recommendation  History of Present Illness 1. Infection of the recent surgical correction of distal femoral shaft fracture   PT Comments   Pt more alert and participatory today with therapy. Oriented to self, situation and some of location (knew hospital, not which one). Re-oriented to  and to date. Pt able to follow commands and moved better today. Per nursing,pt's orientation to above changes frequently and that is her baseline per family report last evening.   Follow Up Recommendations  SNF;Supervision/Assistance - 24 hour     Equipment Recommendations  None recommended by PT    Frequency Min 3X/week   Progress towards PT Goals Progress towards PT goals: Progressing toward goals (new goals added and to be co-signed by PT due to increased mobility  this session)  Plan Current plan remains appropriate    Precautions / Restrictions Precautions Precautions: Fall;Other (comment) Precaution Comments: no ROM of right knee Required Braces or Orthoses: Knee Immobilizer - Right Knee Immobilizer - Right: On at all times Restrictions RLE Weight Bearing: Non weight bearing    Mobility  Bed Mobility Supine to Sit: 4: Min assist;HOB flat;With rails Supine to Sit: Patient Percentage: 70% Sitting - Scoot to Edge of Bed: 4: Min assist Sitting - Scoot to [place] of Bed: Patient Percentage: 70% Details for Bed Mobility Assistance: cues for safety and technique needed; assist for right leg management needed. Transfers Transfers: Sit to Stand;Stand to Sit;Stand Pivot Transfers Sit to Stand: 1: +2 Total assist;From bed;From elevated surface Sit to Stand: Patient Percentage: 60% Stand to Sit: 1: +2 Total assist;To chair/3-in-1;With armrests Stand to Sit: Patient Percentage: 60% Stand Pivot  Transfers: 1: +2 Total assist Stand Pivot Transfers: Patient Percentage: 60% Details for Transfer Assistance: cues for technique. assist for balance and to maintain NWB on right leg with transfers.  Ambulation/Gait Ambulation/Gait Assistance: Not tested (comment)    Exercises Total Joint Exercises Ankle Circles/Pumps: AROM;Both;10 reps;Seated     PT Goals (current goals can now be found in the care plan section) Acute Rehab PT Goals Patient Stated Goal: want to get back to walking like she did before the knee replacment. PT Goal Formulation: With patient Time For Goal Achievement: 06/25/13 Potential to Achieve Goals: Fair  Visit Information  Last PT Received On: 06/19/13 Assistance Needed: +2 History of Present Illness: 1. Infection of the recent surgical correction of distal femoral shaft fracture    Subjective Data  Patient Stated Goal: want to get back to walking like she did before the knee replacment.   Cognition  Cognition Arousal/Alertness: Awake/alert Behavior During Therapy: WFL for tasks assessed/performed Overall Cognitive Status: History of cognitive impairments - at baseline (per nursing)    End of Session PT - End of Session Equipment Utilized During Treatment: Gait belt;Right knee immobilizer Activity Tolerance: Patient tolerated treatment well Patient left: in chair;with call bell/phone within reach Nurse Communication: Mobility status;Patient requests pain meds   GP     Sallyanne Kuster 06/19/2013, 1:19 PM

## 2013-06-19 NOTE — Progress Notes (Signed)
[facility] PROGRESS NOTE  Tammy Sharp Tammy Sharp:[id number] DOB: 03/02/1925 DOA: 06/16/2013 PCP: Pcp Not In System  Assessment/Plan: 1. Infection of the recent surgical correction of distal femoral shaft fracture - Lateral displacement of femoral fracture fixation hardware indicating hardware failure, with protrusion of a surgical screw through the skin. Comminuted distal femoral fracture with 1 full shaft width overlap of the fracture fragments.  - Vanc and cefepime per ID recs with PICC placed per ID recs, possible 4 wks of tx - Per surgery, no obvious signs of obvious infection was noted so no cultures obtained during the surgery - blood cx neg thus far  2. Diabetes mellitus type 2 - Encourage PO as tolerated  3. Hypertension.  - Stable continued on home dose beta blocker.   4. Atrial fibrillation. - Goal will be rate control, oral and IV beta blocker regimen as above.  - Continue heparin bridge with coumadin resumed, subtherapeutic today  5. E.coli UTI - Cont current abx  Code Status: Full Family Communication: Pt in room (indicate person spoken with, relationship, and if by phone, the number) Disposition Planning: Pending SNF early next week  Consultants:  Orthopedic Surgery  ID  Procedures: OPEN REDUCTION INTERNAL FIXATION (ORIF) DISTAL FEMUR FRACTURE 06/17/13  Antibiotics:  Vancomycin 06/16/13>>>  Cefepime 06/16/13>>>  Cefazolin (post-op abx)  HPI/Subjective: No acute events noted overnight  Objective: Filed Vitals:   06/18/13 2109 06/19/13 0235 06/19/13 0500 06/19/13 0552  BP: 126/68   113/70  Pulse: 113 95  94  Temp: 97.9 F (36.6 C)   97.9 F (36.6 C)  TempSrc:      Resp: 16 16  16   Height:      Weight:   91.4 kg (201 lb 8 oz)   SpO2: 100% 97%  96%    Intake/Output Summary (Last 24 hours) at 06/19/13 0806 Last data filed at 06/19/13 0655  Gross per 24 hour  Intake 2338.75 ml  Output   1400 ml  Net 938.75 ml   Filed Weights   06/17/13 0438 06/18/13 0525 06/19/13 0500  Weight: 91 kg (200 lb 9.9 oz) 95.6 kg (210 lb 12.2 oz) 91.4 kg (201 lb 8 oz)    Exam:   General:  Awake, in nad  Cardiovascular: regular, s1, s2  Respiratory: normal resp effort, no wheezing  Abdomen: soft, nondistneded  Musculoskeletal: perfused, no clubbing   Data Reviewed: Basic Metabolic Panel:  Recent Labs Lab 06/13/13 1424 06/16/13 1411 06/16/13 2225 06/17/13 1215 06/18/13 0650  NA 145 142 142  --  136  K 3.8 4.6 3.7  --  4.4  CL 109 108 107  --  106  CO2 29 28 28   --  17*  GLUCOSE 84 58* 175*  --  157*  BUN 19 19 18   --  11  CREATININE 0.80 0.61 0.69 0.62 0.53  CALCIUM 9.0 9.2 8.8  --  8.4   Liver Function Tests: No results found for this basename: AST, ALT, ALKPHOS, BILITOT, PROT, ALBUMIN,  in the last 168 hours No results found for this basename: LIPASE, AMYLASE,  in the last 168 hours No results found for this basename: AMMONIA,  in the last 168 hours CBC:  Recent Labs Lab 06/13/13 1424 06/16/13 1411 06/16/13 2225 06/17/13 1215 06/18/13 0650 06/19/13 0351  WBC 8.4 7.6 7.9 11.5* 9.2 6.5  NEUTROABS 6.3 5.2  --   --   --   --   HGB 11.3* 11.5* 11.1* 10.5* 9.8* 9.0*  HCT 35.8* 36.5  34.8* 32.4* 29.9* 28.1*  MCV 100.3* 101.1* 100.6* 97.9 94.3 96.2  PLT 535* 510* 500* PLATELETS APPEAR INCREASED 387 333   Cardiac Enzymes: No results found for this basename: CKTOTAL, CKMB, CKMBINDEX, TROPONINI,  in the last 168 hours BNP (last 3 results) No results found for this basename: PROBNP,  in the last 8760 hours CBG:  Recent Labs Lab 06/18/13 0634 06/18/13 1054 06/18/13 1642 06/18/13 2145 06/19/13 0633  GLUCAP 149* 128* 110* 113* 129*    Recent Results (from the past 240 hour(s))  URINE CULTURE     Status: None   Collection Time    06/13/13  3:30 PM      Result Value Range Status   Specimen Description URINE, RANDOM   Final   Special Requests NONE   Final   Culture  Setup Time     Final   Value:  06/13/2013 20:18     Performed at [facility] Count     Final   Value: >=100,000 COLONIES/ML     Performed at [facility]   Culture     Final   Value: ESCHERICHIA COLI     Performed at [facility]   Report Status 06/15/2013 FINAL   Final   Organism ID, Bacteria ESCHERICHIA COLI   Final  CULTURE, BLOOD (ROUTINE X 2)     Status: None   Collection Time    06/16/13  6:35 PM      Result Value Range Status   Specimen Description BLOOD LEFT ARM   Final   Special Requests BOTTLES DRAWN AEROBIC ONLY 5CC   Final   Culture  Setup Time     Final   Value: 06/17/2013 00:41     Performed at [facility]   Culture     Final   Value:        BLOOD CULTURE RECEIVED NO GROWTH TO DATE CULTURE WILL BE HELD FOR 5 DAYS BEFORE ISSUING A FINAL NEGATIVE REPORT     Performed at [facility]   Report Status PENDING   Incomplete  CULTURE, BLOOD (ROUTINE X 2)     Status: None   Collection Time    06/16/13  6:45 PM      Result Value Range Status   Specimen Description BLOOD RIGHT HAND   Final   Special Requests BOTTLES DRAWN AEROBIC ONLY 5CC   Final   Culture  Setup Time     Final   Value: 06/17/2013 00:41     Performed at [facility]   Culture     Final   Value:        BLOOD CULTURE RECEIVED NO GROWTH TO DATE CULTURE WILL BE HELD FOR 5 DAYS BEFORE ISSUING A FINAL NEGATIVE REPORT     Performed at [facility]   Report Status PENDING   Incomplete  SURGICAL PCR SCREEN     Status: None   Collection Time    06/17/13  4:36 AM      Result Value Range Status   MRSA, PCR NEGATIVE  NEGATIVE Final   Staphylococcus aureus NEGATIVE  NEGATIVE Final   Comment:            The Xpert SA Assay (FDA     approved for NASAL specimens     in patients over 71 years of age),     is one component of     a comprehensive surveillance     program.  Test performance has     been validated by [facility] for patients greater     than or equal to 1 year  old.     It is not intended     to diagnose infection nor to     guide or monitor treatment.     Studies: Dg Femur Right  06/17/2013   CLINICAL DATA:  Intra op right femur ORIF  EXAM: RIGHT FEMUR - 2 VIEW  COMPARISON:  06/16/2013  FINDINGS: A total right hip arthroplasty is appreciated with cerclage wiring along the proximal femur. The femoral component appears intact. Lateral buttress plate and cannulated screw fixation is identified along the distal femur with cerclage wiring. A total right knee arthroplasty is appreciated.  IMPRESSION: ORIF right femur   Electronically Signed   By: Salome Holmes M.D.   On: 06/17/2013 11:47   Dg Chest Port 1 View  06/18/2013   CLINICAL DATA:  PICC placement.  Infected prosthetic knee joint.  EXAM: PORTABLE CHEST - 1 VIEW  COMPARISON:  Chest x-ray dated 06/17/2013  FINDINGS: PICC tip is in the superior vena cava in good position just below the carina. There is a 6 mm faint density overlying the right upper lung zone which I think represents a bone island in the right 2nd rib.  Heart size is normal. There is slight prominence of the main pulmonary artery.  No infiltrates or effusions.  IMPRESSION: PICC in good position.   Electronically Signed   By: Geanie Cooley M.D.   On: 06/18/2013 15:28   Dg C-arm 1-60 Min  06/17/2013   CLINICAL DATA:  Hardware removal.  EXAM: DG C-ARM 1-60 MIN  TECHNIQUE: Six C-arm views submitted for review after procedure  COMPARISON:  06/16/2013.  FLUOROSCOPY TIME:  40 seconds  FINDINGS: Long stem right total hip prosthesis with proximal distal cerclage wires in place. Long segment sideplate distal femur. The distal aspect of the sideplate has been reapproximated with the distal femoral fracture with screws placed. Post right knee replacement. These findings can be assessed on follow up.  IMPRESSION: Long stem right total hip prosthesis with proximal distal cerclage wires in place.  Long segment sideplate distal femur. The distal aspect of  the sideplate has been reapproximated with the distal femoral fracture with screws placed.  Post right knee replacement.  These findings can be assessed on follow up.   Electronically Signed   By: Bridgett Larsson M.D.   On: 06/17/2013 12:24    Scheduled Meds: . atenolol  50 mg Oral Daily  . ceFEPime (MAXIPIME) IV  1 g Intravenous Q12H  . docusate sodium  100 mg Oral BID  . insulin aspart  0-9 Units Subcutaneous TID WC  . latanoprost  1 drop Both Eyes QHS  . pantoprazole  40 mg Oral Daily  . sodium chloride  3 mL Intravenous Q12H  . vancomycin (VANCOCIN) 750 mg IVPB  750 mg Intravenous Q12H  . Warfarin - Pharmacist Dosing Inpatient   Does not apply q1800   Continuous Infusions: . sodium chloride 75 mL/hr at 06/19/13 0655  . dextrose 5 % and 0.45% NaCl    . heparin 1,250 Units/hr (06/19/13 0143)    Principal Problem:   Infected prosthetic knee joint Active Problems:   Post op infection   A-fib   CVA (cerebral infarction)   HTN (hypertension)   DM2 (diabetes mellitus, type 2)  Time spent:  CHIU, STEPHEN K  [facility] Pager [phone].  If 7PM-7AM, please contact night-coverage at [email or web address], password [facility] 06/19/2013, 8:06 AM  LOS: 3 days

## 2013-06-19 NOTE — Progress Notes (Signed)
Discussed with Sallyanne Kuster and agree with updated goals. [address] [place], [place]  [phone] 06/19/2013

## 2013-06-19 NOTE — Progress Notes (Signed)
Subjective: 2 Days Post-Op Procedure(s) (LRB): OPEN REDUCTION INTERNAL FIXATION (ORIF) DISTAL FEMUR FRACTURE (Right) Patient reports pain as 3 on 0-10 scale.  Patient appears to be comfortable.  She has been passing gas and has had a bm.  Tolerating diet well.    Objective: Vital signs in last 24 hours: Temp:  [97.9 F (36.6 C)-98.3 F (36.8 C)] 97.9 F (36.6 C) (12/13 0552) Pulse Rate:  [94-113] 94 (12/13 0552) Resp:  [16] 16 (12/13 0552) BP: (108-126)/(49-70) 113/70 mmHg (12/13 0552) SpO2:  [96 %-100 %] 96 % (12/13 0552) Weight:  [91.4 kg (201 lb 8 oz)] 91.4 kg (201 lb 8 oz) (12/13 0500)  Intake/Output from previous day: 12/12 0701 - 12/13 0700 In: 2338.8 [P.O.:170; I.V.:1868.8; IV Piggyback:300] Out: 1400 [Urine:1400] Intake/Output this shift: Total I/O In: -  Out: 100 [Urine:100]   Recent Labs  06/16/13 1411 06/16/13 2225 06/17/13 1215 06/18/13 0650 06/19/13 0351  HGB 11.5* 11.1* 10.5* 9.8* 9.0*    Recent Labs  06/18/13 0650 06/19/13 0351  WBC 9.2 6.5  RBC 3.17* 2.92*  HCT 29.9* 28.1*  PLT 387 333    Recent Labs  06/16/13 2225 06/17/13 1215 06/18/13 0650  NA 142  --  136  K 3.7  --  4.4  CL 107  --  106  CO2 28  --  17*  BUN 18  --  11  CREATININE 0.69 0.62 0.53  GLUCOSE 175*  --  157*  CALCIUM 8.8  --  8.4    Recent Labs  06/18/13 0650 06/19/13 0351  INR 1.72* 1.64*    Neurologically intact ABD soft Neurovascular intact Sensation intact distally Intact pulses distally Dorsiflexion/Plantar flexion intact Compartment soft Negative Homan's   Assessment/Plan: 2 Days Post-Op Procedure(s) (LRB): OPEN REDUCTION INTERNAL FIXATION (ORIF) DISTAL FEMUR FRACTURE (Right) Advance diet NO ROM whatsoever on the right lower extremity and must be in immobilizer at all times.   Non-weight bearing right lower extremity Continue plan per medicine  ANTON, M. LINDSEY 06/19/2013, 11:11 AM

## 2013-06-20 LAB — TYPE AND SCREEN: Unit division: 0

## 2013-06-20 LAB — GLUCOSE, CAPILLARY
Glucose-Capillary: 109 mg/dL — ABNORMAL HIGH (ref 70–99)
Glucose-Capillary: 145 mg/dL — ABNORMAL HIGH (ref 70–99)
Glucose-Capillary: 136 mg/dL — ABNORMAL HIGH (ref 70–99)

## 2013-06-20 LAB — CBC
HCT: 26.2 % — ABNORMAL LOW (ref 36.0–46.0)
Hemoglobin: 8.5 g/dL — ABNORMAL LOW (ref 12.0–15.0)
MCV: 96.3 fL (ref 78.0–100.0)
RDW: 17.1 % — ABNORMAL HIGH (ref 11.5–15.5)
WBC: 6.7 10*3/uL (ref 4.0–10.5)

## 2013-06-20 LAB — PROTIME-INR: Prothrombin Time: 20.2 seconds — ABNORMAL HIGH (ref 11.6–15.2)

## 2013-06-20 LAB — HEPARIN LEVEL (UNFRACTIONATED): Heparin Unfractionated: 0.5 IU/mL (ref 0.30–0.70)

## 2013-06-20 MED ORDER — WARFARIN SODIUM 4 MG PO TABS
8.0000 mg | ORAL_TABLET | Freq: Once | ORAL | Status: AC
  Administered 2013-06-20: 8 mg via ORAL
  Filled 2013-06-20: qty 2

## 2013-06-20 MED ORDER — POLYETHYLENE GLYCOL 3350 17 G PO PACK
17.0000 g | PACK | Freq: Every day | ORAL | Status: DC | PRN
  Administered 2013-06-20: 17 g via ORAL
  Filled 2013-06-20: qty 1

## 2013-06-20 NOTE — Progress Notes (Signed)
ANTICOAGULATION CONSULT NOTE - Follow Up Consult  Pharmacy Consult for heparin Indication: atrial fibrillation and VTE prophylaxis  No Known Allergies  Patient Measurements: Height: 5' 6.93" (170 cm) Weight: 201 lb 8 oz (91.4 kg) (Pt weighed yesterday @ 95.6kg but NT did not remove equipmen) IBW/kg (Calculated) : 61.44 Heparin Dosing Weight: 80kg  Vital Signs: Temp: 97.2 F (36.2 C) (12/14 1548) Temp src: Oral (12/14 1548) BP: 118/48 mmHg (12/14 1548) Pulse Rate: 80 (12/14 1548)  Labs:  Recent Labs  06/18/13 0650  06/19/13 0351 06/19/13 1845 06/20/13 0500 06/20/13 1800  HGB 9.8*  --  9.0*  --  8.5*  --   HCT 29.9*  --  28.1*  --  26.2*  --   PLT 387  --  333  --  345  --   LABPROT 19.7*  --  19.0*  --  20.2*  --   INR 1.72*  --  1.64*  --  1.78*  --   HEPARINUNFRC 0.32  < > 0.34 0.25* 0.24* 0.50  CREATININE 0.53  --   --   --   --   --   < > = values in this interval not displayed.  Estimated Creatinine Clearance: 58.5 ml/min (by C-G formula based on Cr of 0.53).   Medications:  Heparin at 1500 units/hr  Assessment: 88 YOF continues on heparin for AFib and VTE prophylaxis now therapeutic on heparin. Level drawn this evening was 0.5 units/mL. There has been issues maintaining a peripheral line but no issues since yesterday morning. No bleeding noted.  Goal of Therapy:  Heparin level 0.3-0.7 units/ml Monitor platelets by anticoagulation protocol: Yes   Plan:  1. Continue heparin at 1500 units/hr 2. Confirm with AM labs 3. Follow for s/s bleeding 4. Daily HL and CBC  Ellison Rieth D. Hollan Philipp, PharmD, BCPS Clinical Pharmacist Pager: [phone] 06/20/2013 6:40 PM

## 2013-06-20 NOTE — Progress Notes (Signed)
ANTICOAGULATION CONSULT NOTE - Follow Up Consult  Pharmacy Consult for heparin and coumadin Indication: afib and VTE prophylaxis  No Known Allergies  Patient Measurements: Height: 5' 6.93" (170 cm) Weight: 201 lb 8 oz (91.4 kg) (Pt weighed yesterday @ 95.6kg but NT did not remove equipmen) IBW/kg (Calculated) : 61.44 Heparin Dosing Weight: 80 kg  Vital Signs: Temp: 97.5 F (36.4 C) (12/14 0624) Temp src: Oral (12/14 0624) BP: 107/49 mmHg (12/14 0624) Pulse Rate: 95 (12/14 0624)  Labs:  Recent Labs  06/17/13 1215  06/18/13 0650  06/19/13 0351 06/19/13 1845 06/20/13 0500  HGB 10.5*  --  9.8*  --  9.0*  --  8.5*  HCT 32.4*  --  29.9*  --  28.1*  --  26.2*  PLT PLATELETS APPEAR INCREASED  --  387  --  333  --  345  LABPROT  --   --  19.7*  --  19.0*  --  20.2*  INR  --   --  1.72*  --  1.64*  --  1.78*  HEPARINUNFRC  --   < > 0.32  < > 0.34 0.25* 0.24*  CREATININE 0.62  --  0.53  --   --   --   --   < > = values in this interval not displayed.  Estimated Creatinine Clearance: 58.5 ml/min (by C-G formula based on Cr of 0.53).   Medications:  Scheduled:  . atenolol  50 mg Oral Daily  . ceFEPime (MAXIPIME) IV  1 g Intravenous Q12H  . docusate sodium  100 mg Oral BID  . insulin aspart  0-9 Units Subcutaneous TID WC  . latanoprost  1 drop Both Eyes QHS  . pantoprazole  40 mg Oral Daily  . sodium chloride  3 mL Intravenous Q12H  . vancomycin  1,000 mg Intravenous Q12H  . Warfarin - Pharmacist Dosing Inpatient   Does not apply q1800   Infusions:  . sodium chloride 20 mL/hr at 06/19/13 1700  . dextrose 5 % and 0.45% NaCl    . heparin 1,350 Units/hr (06/19/13 2330)    Assessment: 79 yo female currently on therapeutic heparin for afib and VTE prophylaxis s/p ortho surgery.   Heparin level is SUBtherapeutic at 0.24 this am after increase in rate last evening. Per RN has been infusing okay since interruption yesterday morning.  Of note, patient having trouble maintaining  a patent peripheral IV.  INR remains SUBtherapeutic at 1.78 after coumadin restart on 12/12, will increase dose in an attempt to get to goal INR.  Hgb has slightly decreased, no s/s bleeding reported, POD 3  PTA coumadin dose 5 mg PO daily  Goal of Therapy:  INR 2-3 Heparin level 0.3-0.7 units/ml Monitor platelets by anticoagulation protocol: Yes   Plan:  1) Increase IV heparin gtt to 1500 units/hr 2) Will recheck heparin level this afternoon 3) Coumadin 8 mg PO x 1 4) Continue daily heparin level, CBC, and INR 5) Monitor for s/s bleeding  Margie Billet, PharmD Clinical Pharmacist - Resident Pager: [phone] Pharmacy: [phone] 06/20/2013 9:30 AM

## 2013-06-20 NOTE — Progress Notes (Addendum)
Subjective: 3 Days Post-Op Procedure(s) (LRB): OPEN REDUCTION INTERNAL FIXATION (ORIF) DISTAL FEMUR FRACTURE (Right) Patient reports pain as 2 on 0-10 scale.  Reports mild pain.  No fever/chills.  No nausea/vomiting.  Tolerating diet well.    Objective: Vital signs in last 24 hours: Temp:  [97.5 F (36.4 C)-97.7 F (36.5 C)] 97.5 F (36.4 C) (12/14 0624) Pulse Rate:  [95-106] 95 (12/14 0624) Resp:  [16] 16 (12/14 0624) BP: (107-120)/(49-89) 107/49 mmHg (12/14 0624) SpO2:  [97 %-98 %] 98 % (12/14 0624)  Intake/Output from previous day: 12/13 0701 - 12/14 0700 In: 2040.3 [P.O.:1060; I.V.:930.3; IV Piggyback:50] Out: 400 [Urine:400] Intake/Output this shift: Total I/O In: 360 [P.O.:360] Out: -    Recent Labs  06/18/13 0650 06/19/13 0351 06/20/13 0500  HGB 9.8* 9.0* 8.5*    Recent Labs  06/19/13 0351 06/20/13 0500  WBC 6.5 6.7  RBC 2.92* 2.72*  HCT 28.1* 26.2*  PLT 333 345    Recent Labs  06/18/13 0650  NA 136  K 4.4  CL 106  CO2 17*  BUN 11  CREATININE 0.53  GLUCOSE 157*  CALCIUM 8.4    Recent Labs  06/19/13 0351 06/20/13 0500  INR 1.64* 1.78*    Neurologically intact ABD soft Neurovascular intact Sensation intact distally Intact pulses distally Dorsiflexion/Plantar flexion intact Compartment soft  Assessment/Plan: 3 Days Post-Op Procedure(s) (LRB): OPEN REDUCTION INTERNAL FIXATION (ORIF) DISTAL FEMUR FRACTURE (Right) Advance diet Up with therapy Non-weight bearing right lower extremity No range of motion and must be in knee immobilizer at all times Continue plan per medicine Family is requesting placement at [facility], M. LINDSEY 06/20/2013, 1:22 PM

## 2013-06-20 NOTE — Progress Notes (Signed)
[facility] PROGRESS NOTE  Tammy Sharp ZOX:[id number] DOB: 1924/07/13 DOA: 06/16/2013 PCP: Pcp Not In System  Assessment/Plan: 1. Infection of the recent surgical correction of distal femoral shaft fracture - Lateral displacement of femoral fracture fixation hardware indicating hardware failure, with protrusion of a surgical screw through the skin. Comminuted distal femoral fracture with 1 full shaft width overlap of the fracture fragments.  - Vanc and cefepime per ID recs with PICC placed per ID recs, possible 4 wks of tx - Per surgery, no obvious signs of obvious infection was noted so no cultures obtained during the surgery - blood cx remains neg thus far  2. Diabetes mellitus type 2 - Encourage PO as tolerated  3. Hypertension.  - Stable continued on home dose beta blocker.   4. Atrial fibrillation. - Goal will be rate control, oral and IV beta blocker regimen as above.  - Continue heparin bridge with coumadin resumed, subtherapeutic today  5. E.coli UTI - Cont current abx - Resistant to ampicillin, otherwise multidrug sensitive  Code Status: Full Family Communication: Pt in room (indicate person spoken with, relationship, and if by phone, the number) Disposition Planning: Pending SNF early next week  Consultants:  Orthopedic Surgery  ID  Procedures: OPEN REDUCTION INTERNAL FIXATION (ORIF) DISTAL FEMUR FRACTURE 06/17/13  PICC placement 06/18/13  Antibiotics:  Vancomycin 06/16/13>>>  Cefepime 06/16/13>>>  Cefazolin (post-op abx)  HPI/Subjective: No acute events noted overnight. Reports feeling better this AM.  Objective: Filed Vitals:   06/19/13 0552 06/19/13 1315 06/19/13 2127 06/20/13 0624  BP: 113/70 111/75 120/89 107/49  Pulse: 94 116 106 95  Temp: 97.9 F (36.6 C) 98.6 F (37 C) 97.7 F (36.5 C) 97.5 F (36.4 C)  TempSrc:  Oral Oral Oral  Resp: 16 16 16 16   Height:      Weight:      SpO2: 96% 95% 97% 98%    Intake/Output Summary  (Last 24 hours) at 06/20/13 0821 Last data filed at 06/20/13 8119  Gross per 24 hour  Intake 1840.25 ml  Output    300 ml  Net 1540.25 ml   Filed Weights   06/17/13 0438 06/18/13 0525 06/19/13 0500  Weight: 91 kg (200 lb 9.9 oz) 95.6 kg (210 lb 12.2 oz) 91.4 kg (201 lb 8 oz)    Exam:   General:  Awake, in nad  Cardiovascular: regular, s1, s2  Respiratory: normal resp effort, no wheezing  Abdomen: soft, nondistneded  Musculoskeletal: perfused, no clubbing, post-op hardware in place over R LE  Data Reviewed: Basic Metabolic Panel:  Recent Labs Lab 06/13/13 1424 06/16/13 1411 06/16/13 2225 06/17/13 1215 06/18/13 0650  NA 145 142 142  --  136  K 3.8 4.6 3.7  --  4.4  CL 109 108 107  --  106  CO2 29 28 28   --  17*  GLUCOSE 84 58* 175*  --  157*  BUN 19 19 18   --  11  CREATININE 0.80 0.61 0.69 0.62 0.53  CALCIUM 9.0 9.2 8.8  --  8.4   Liver Function Tests: No results found for this basename: AST, ALT, ALKPHOS, BILITOT, PROT, ALBUMIN,  in the last 168 hours No results found for this basename: LIPASE, AMYLASE,  in the last 168 hours No results found for this basename: AMMONIA,  in the last 168 hours CBC:  Recent Labs Lab 06/13/13 1424 06/16/13 1411 06/16/13 2225 06/17/13 1215 06/18/13 0650 06/19/13 0351 06/20/13 0500  WBC 8.4 7.6 7.9 11.5* 9.2 6.5  6.7  NEUTROABS 6.3 5.2  --   --   --   --   --   HGB 11.3* 11.5* 11.1* 10.5* 9.8* 9.0* 8.5*  HCT 35.8* 36.5 34.8* 32.4* 29.9* 28.1* 26.2*  MCV 100.3* 101.1* 100.6* 97.9 94.3 96.2 96.3  PLT 535* 510* 500* PLATELETS APPEAR INCREASED 387 333 345   Cardiac Enzymes: No results found for this basename: CKTOTAL, CKMB, CKMBINDEX, TROPONINI,  in the last 168 hours BNP (last 3 results) No results found for this basename: PROBNP,  in the last 8760 hours CBG:  Recent Labs Lab 06/19/13 0633 06/19/13 1239 06/19/13 1757 06/19/13 2349 06/20/13 0700  GLUCAP 129* 163* 165* 117* 109*    Recent Results (from the past  240 hour(s))  URINE CULTURE     Status: None   Collection Time    06/13/13  3:30 PM      Result Value Range Status   Specimen Description URINE, RANDOM   Final   Special Requests NONE   Final   Culture  Setup Time     Final   Value: 06/13/2013 20:18     Performed at [facility] Count     Final   Value: >=100,000 COLONIES/ML     Performed at [facility]   Culture     Final   Value: ESCHERICHIA COLI     Performed at [facility]   Report Status 06/15/2013 FINAL   Final   Organism ID, Bacteria ESCHERICHIA COLI   Final  CULTURE, BLOOD (ROUTINE X 2)     Status: None   Collection Time    06/16/13  6:35 PM      Result Value Range Status   Specimen Description BLOOD LEFT ARM   Final   Special Requests BOTTLES DRAWN AEROBIC ONLY 5CC   Final   Culture  Setup Time     Final   Value: 06/17/2013 00:41     Performed at [facility]   Culture     Final   Value:        BLOOD CULTURE RECEIVED NO GROWTH TO DATE CULTURE WILL BE HELD FOR 5 DAYS BEFORE ISSUING A FINAL NEGATIVE REPORT     Performed at [facility]   Report Status PENDING   Incomplete  CULTURE, BLOOD (ROUTINE X 2)     Status: None   Collection Time    06/16/13  6:45 PM      Result Value Range Status   Specimen Description BLOOD RIGHT HAND   Final   Special Requests BOTTLES DRAWN AEROBIC ONLY 5CC   Final   Culture  Setup Time     Final   Value: 06/17/2013 00:41     Performed at [facility]   Culture     Final   Value:        BLOOD CULTURE RECEIVED NO GROWTH TO DATE CULTURE WILL BE HELD FOR 5 DAYS BEFORE ISSUING A FINAL NEGATIVE REPORT     Performed at [facility]   Report Status PENDING   Incomplete  SURGICAL PCR SCREEN     Status: None   Collection Time    06/17/13  4:36 AM      Result Value Range Status   MRSA, PCR NEGATIVE  NEGATIVE Final   Staphylococcus aureus NEGATIVE  NEGATIVE Final   Comment:            The Xpert SA Assay (FDA  approved for  NASAL specimens     in patients over 75 years of age),     is one component of     a comprehensive surveillance     program.  Test performance has     been validated by [facility] for patients greater     than or equal to 27 year old.     It is not intended     to diagnose infection nor to     guide or monitor treatment.     Studies: Dg Chest Port 1 View  06/18/2013   CLINICAL DATA:  PICC placement.  Infected prosthetic knee joint.  EXAM: PORTABLE CHEST - 1 VIEW  COMPARISON:  Chest x-ray dated 06/17/2013  FINDINGS: PICC tip is in the superior vena cava in good position just below the carina. There is a 6 mm faint density overlying the right upper lung zone which I think represents a bone island in the right 2nd rib.  Heart size is normal. There is slight prominence of the main pulmonary artery.  No infiltrates or effusions.  IMPRESSION: PICC in good position.   Electronically Signed   By: Geanie Cooley M.D.   On: 06/18/2013 15:28    Scheduled Meds: . atenolol  50 mg Oral Daily  . ceFEPime (MAXIPIME) IV  1 g Intravenous Q12H  . docusate sodium  100 mg Oral BID  . insulin aspart  0-9 Units Subcutaneous TID WC  . latanoprost  1 drop Both Eyes QHS  . pantoprazole  40 mg Oral Daily  . sodium chloride  3 mL Intravenous Q12H  . vancomycin  1,000 mg Intravenous Q12H  . Warfarin - Pharmacist Dosing Inpatient   Does not apply q1800   Continuous Infusions: . sodium chloride 20 mL/hr at 06/19/13 1700  . dextrose 5 % and 0.45% NaCl    . heparin 1,350 Units/hr (06/19/13 2330)    Principal Problem:   Infected prosthetic knee joint Active Problems:   Post op infection   A-fib   CVA (cerebral infarction)   HTN (hypertension)   DM2 (diabetes mellitus, type 2)  Time spent:  Treyven Lafauci K  [facility] Pager [phone]. If 7PM-7AM, please contact night-coverage at [email or web address], password [facility] 06/20/2013, 8:21 AM  LOS: 4 days

## 2013-06-21 LAB — CBC
HCT: 29.4 % — ABNORMAL LOW (ref 36.0–46.0)
Hemoglobin: 9.5 g/dL — ABNORMAL LOW (ref 12.0–15.0)
MCV: 96.1 fL (ref 78.0–100.0)
RBC: 3.06 MIL/uL — ABNORMAL LOW (ref 3.87–5.11)
WBC: 8.6 10*3/uL (ref 4.0–10.5)

## 2013-06-21 LAB — GLUCOSE, CAPILLARY
Glucose-Capillary: 135 mg/dL — ABNORMAL HIGH (ref 70–99)
Glucose-Capillary: 131 mg/dL — ABNORMAL HIGH (ref 70–99)
Glucose-Capillary: 166 mg/dL — ABNORMAL HIGH (ref 70–99)

## 2013-06-21 LAB — PROTIME-INR: INR: 2.2 — ABNORMAL HIGH (ref 0.00–1.49)

## 2013-06-21 MED ORDER — WARFARIN SODIUM 5 MG PO TABS
5.0000 mg | ORAL_TABLET | Freq: Once | ORAL | Status: DC
  Filled 2013-06-21: qty 1

## 2013-06-21 MED ORDER — HYDROCODONE-ACETAMINOPHEN 5-325 MG PO TABS: 1.0000 | ORAL_TABLET | ORAL | Status: DC | PRN

## 2013-06-21 MED ORDER — VANCOMYCIN HCL IN DEXTROSE 1-5 GM/200ML-% IV SOLN: 1000.0000 mg | Freq: Two times a day (BID) | INTRAVENOUS | Status: DC

## 2013-06-21 MED ORDER — DEXTROSE 5 % IV SOLN: 1.0000 g | Freq: Two times a day (BID) | INTRAVENOUS | Status: DC

## 2013-06-21 MED ORDER — POLYETHYLENE GLYCOL 3350 17 G PO PACK: 17.0000 g | PACK | Freq: Every day | ORAL | Status: AC | PRN

## 2013-06-21 MED ORDER — HEPARIN SOD (PORK) LOCK FLUSH 100 UNIT/ML IV SOLN
250.0000 [IU] | INTRAVENOUS | Status: AC | PRN
  Administered 2013-06-21: 250 [IU]

## 2013-06-21 NOTE — Progress Notes (Signed)
ANTICOAGULATION CONSULT NOTE - Follow Up Consult  Pharmacy Consult for coumadin Indication: afib and VTE prophylaxis  No Known Allergies  Patient Measurements: Height: 5' 6.93" (170 cm) Weight: 203 lb 11.3 oz (92.4 kg) IBW/kg (Calculated) : 61.44 Heparin Dosing Weight: 80 kg  Vital Signs: Temp: 98 F (36.7 C) (12/15 0652) Temp src: Oral (12/15 0652) BP: 103/55 mmHg (12/15 0652) Pulse Rate: 83 (12/15 0652)  Labs:  Recent Labs  06/19/13 0351  06/20/13 0500 06/20/13 1800 06/21/13 0444  HGB 9.0*  --  8.5*  --  9.5*  HCT 28.1*  --  26.2*  --  29.4*  PLT 333  --  345  --  409*  LABPROT 19.0*  --  20.2*  --  23.7*  INR 1.64*  --  1.78*  --  2.20*  HEPARINUNFRC 0.34  < > 0.24* 0.50 0.40  < > = values in this interval not displayed.  Estimated Creatinine Clearance: 58.8 ml/min (by C-G formula based on Cr of 0.53).   Medications:  Scheduled:  . atenolol  50 mg Oral Daily  . ceFEPime (MAXIPIME) IV  1 g Intravenous Q12H  . docusate sodium  100 mg Oral BID  . insulin aspart  0-9 Units Subcutaneous TID WC  . latanoprost  1 drop Both Eyes QHS  . pantoprazole  40 mg Oral Daily  . sodium chloride  3 mL Intravenous Q12H  . vancomycin  1,000 mg Intravenous Q12H  . Warfarin - Pharmacist Dosing Inpatient   Does not apply q1800   Infusions:  . sodium chloride 20 mL/hr at 06/19/13 1700  . dextrose 5 % and 0.45% NaCl      Assessment: 79 yo female currently on coumadin for afib and VTE prophylaxis s/p ortho surgery.    INR 2.2 after coumadin restart on 12/12  PTA coumadin dose 5 mg PO daily  Goal of Therapy:  INR 2-3 Monitor platelets by anticoagulation protocol: Yes   Plan:   Coumadin 5mg  PO x1 If stable, will restart home dose Daily INR

## 2013-06-21 NOTE — Progress Notes (Signed)
RN attempted to call report to [facility], no answer. Will call back in 1 hour if patient has not already discharged.

## 2013-06-21 NOTE — Discharge Summary (Signed)
Physician Discharge Summary  Tammy Sharp ZOX:[id number] DOB: [birth year] DOA: 06/16/2013  PCP: Pcp Not In System  Admit date: 06/16/2013 Discharge date: 06/21/2013  Time spent: 35 minutes  Recommendations for Outpatient Follow-up:  1. Follow up with PCP in 1-2 weeks 2. Follow up with Dr. Ninetta Lights in 3-4 weeks 3. Would repeat INR within 5 days 4. D/c PICC line after finishing antibiotics on 07/13/13  Discharge Diagnoses:  Principal Problem:   Infected prosthetic knee joint Active Problems:   Post op infection   A-fib   CVA (cerebral infarction)   HTN (hypertension)   DM2 (diabetes mellitus, type 2)   Discharge Condition: Improved  Diet recommendation: Diabetic  Filed Weights   06/18/13 0525 06/19/13 0500 06/20/13 1900  Weight: 95.6 kg (210 lb 12.2 oz) 91.4 kg (201 lb 8 oz) 92.4 kg (203 lb 11.3 oz)    History of present illness:  Tammy Sharp is a 79 y.o. female, with history of atrial fibrillation on Coumadin, history of stroke few years ago from which she has completely recovered except poor balance, type 2 diabetes mellitus, hypertension, history of right total hip replacement and right total knee replacement who lives in [place] and was recently visiting family in [place] where she fell in the hotel room and developed fracture of the distal femoral shaft, she underwent open reduction internal fixation of the distal femoral shaft in [place] day before Thanksgiving, she was then sent to the rehabilitation facility in [place] as she had family in town. For the first few days of her stay in [place] she did well, subsequently physical therapy noted and that she was developing some wound dehiscence and finally they noticed a screw sticking out of her wound along with some serosanguineous and bloody discharge. She was then sent to the ER where she was diagnosed with postop infection around her total knee replacement and distal femoral shaft  fracture, orthopedics was consulted who requested hospitalist to admit the patient.  Hospital Course:  1. Infection of the recent surgical correction of distal femoral shaft fracture  - Lateral displacement of femoral fracture fixation hardware indicating hardware failure, with protrusion of a surgical screw through the skin. Comminuted distal femoral fracture with 1 full shaft width overlap of the fracture fragments.  - Vanc and cefepime per ID recs with PICC placed per ID recs, possible 4 wks of tx (End date of abx on 07/13/13) - Per surgery, no obvious signs of obvious infection was noted so no cultures obtained during the surgery - blood cx remains neg thus far  2. Diabetes mellitus type 2  - Encourage PO as tolerated  3. Hypertension.  - Stable continued on home dose beta blocker.  4. Atrial fibrillation.  - Goal will be rate control, oral and IV beta blocker regimen as above.  - Continued initially on heparin bridge with coumadin resumed - Coumadin therapeutic by day of discharge 5. E.coli UTI  - Cont current abx  - Resistant to ampicillin, otherwise multidrug sensitive  Procedures:  ORIF of distal R femur fracture  Consultations:  Orthopedic surgery  ID  Discharge Exam: Filed Vitals:   06/20/13 1548 06/20/13 1900 06/20/13 2108 06/21/13 0652  BP: 118/48  121/66 103/55  Pulse: 80  86 83  Temp: 97.2 F (36.2 C)  97.6 F (36.4 C) 98 F (36.7 C)  TempSrc: Oral  Oral Oral  Resp: 18  18 18   Height:      Weight:  92.4 kg (203  lb 11.3 oz)    SpO2: 98%  94% 97%    General: Awake, in nad Cardiovascular: regular, s1, s2 Respiratory: Normal resp effort, no wheezing  Discharge Instructions     Medication List    STOP taking these medications       cephALEXin 500 MG capsule  Commonly known as:  KEFLEX     morphine 4 MG/ML injection     oxyCODONE-acetaminophen 5-325 MG per tablet  Commonly known as:  PERCOCET/ROXICET      TAKE these medications        acetaminophen 325 MG tablet  Commonly known as:  TYLENOL  Take 650 mg by mouth every 6 (six) hours as needed for mild pain.     APIDRA 100 UNIT/ML injection  Generic drug:  insulin glulisine  Inject 5 Units into the skin 3 (three) times daily before meals.     atenolol 50 MG tablet  Commonly known as:  TENORMIN  Take 50 mg by mouth daily.     docusate sodium 100 MG capsule  Commonly known as:  COLACE  Take 100 mg by mouth 2 (two) times daily.     HYDROcodone-acetaminophen 5-325 MG per tablet  Commonly known as:  NORCO/VICODIN  Take 1-2 tablets by mouth every 4 (four) hours as needed for moderate pain.     insulin detemir 100 UNIT/ML injection  Commonly known as:  LEVEMIR  Inject 15 Units into the skin 2 (two) times daily.     iron polysaccharides 150 MG capsule  Commonly known as:  NIFEREX  Take 150 mg by mouth daily.     latanoprost 0.005 % ophthalmic solution  Commonly known as:  XALATAN  Place 1 drop into both eyes at bedtime.     methocarbamol 500 MG tablet  Commonly known as:  ROBAXIN  Take 500 mg by mouth 3 (three) times daily as needed for muscle spasms.     nitroGLYCERIN 0.4 MG SL tablet  Commonly known as:  NITROSTAT  Place 0.4 mg under the tongue every 5 (five) minutes as needed for chest pain.     omeprazole 20 MG capsule  Commonly known as:  PRILOSEC  Take 20 mg by mouth daily.     ondansetron 4 MG/2ML Soln injection  Commonly known as:  ZOFRAN  Inject 4 mg into the vein every 8 (eight) hours as needed for nausea or vomiting.     polyethylene glycol packet  Commonly known as:  MIRALAX / GLYCOLAX  Take 17 g by mouth daily as needed for moderate constipation or severe constipation.     warfarin 5 MG tablet  Commonly known as:  COUMADIN  Take 5 mg by mouth at bedtime.       No Known Allergies    The results of significant diagnostics from this hospitalization (including imaging, microbiology, ancillary and laboratory) are listed below for  reference.    Significant Diagnostic Studies: Dg Femur Right  06/17/2013   CLINICAL DATA:  Intra op right femur ORIF  EXAM: RIGHT FEMUR - 2 VIEW  COMPARISON:  06/16/2013  FINDINGS: A total right hip arthroplasty is appreciated with cerclage wiring along the proximal femur. The femoral component appears intact. Lateral buttress plate and cannulated screw fixation is identified along the distal femur with cerclage wiring. A total right knee arthroplasty is appreciated.  IMPRESSION: ORIF right femur   Electronically Signed   By: Salome Holmes M.D.   On: 06/17/2013 11:47   Dg Femur Right  06/16/2013  CLINICAL DATA:  Recent surgery, fall on 06/01/2013, screw poking os skin, draining pus and blood, history hypertension, diabetes  EXAM: RIGHT FEMUR - 2 VIEW  COMPARISON:  None  FINDINGS: Long stem of a right femoral prosthesis extends to the distal femur.  Components of the right knee prosthesis are identified in expected positions.  Lateral plate with cerclage wires and 4 distal screws identified post ORIF of a distal femoral diaphyseal fracture.  Fracture demonstrates mild apex lateral angulation and slight medial displacement.  Inferior most screw has withdrawn from the plate and extends to the skin surface.  A gap is present between the lateral plate and the distal femur.  Visualized right pelvis intact.  Soft tissue swelling and skin clips identified at the distal thigh to the knee.  IMPRESSION: Prior right hip and knee replacements.  Prior ORIF of a distal femoral diaphyseal fracture with displacement of the dominant distal femoral fragment from the plate.  Inferior most screw extends has withdrawn from the plate and extends external to distal femoral soft tissues.   Electronically Signed   By: Ulyses Southward M.D.   On: 06/16/2013 16:19   Dg Chest Port 1 View  06/18/2013   CLINICAL DATA:  PICC placement.  Infected prosthetic knee joint.  EXAM: PORTABLE CHEST - 1 VIEW  COMPARISON:  Chest x-ray dated  06/17/2013  FINDINGS: PICC tip is in the superior vena cava in good position just below the carina. There is a 6 mm faint density overlying the right upper lung zone which I think represents a bone island in the right 2nd rib.  Heart size is normal. There is slight prominence of the main pulmonary artery.  No infiltrates or effusions.  IMPRESSION: PICC in good position.   Electronically Signed   By: Geanie Cooley M.D.   On: 06/18/2013 15:28   Dg Chest Port 1 View  06/17/2013   CLINICAL DATA:  Preop for right femoral fracture  EXAM: PORTABLE CHEST - 1 VIEW  COMPARISON:  None.  FINDINGS: Cardiomediastinal silhouette is unremarkable. Elevation of the right hemidiaphragm. Mild atherosclerotic calcifications of thoracic aorta. No acute infiltrate or pulmonary edema.  IMPRESSION: No active disease.   Electronically Signed   By: Natasha Mead M.D.   On: 06/17/2013 08:14   Dg Knee Complete 4 Views Right  06/16/2013   CLINICAL DATA:  Fall, recent femoral fixation, presumably at an outside institution  EXAM: RIGHT KNEE - COMPLETE 4+ VIEW  COMPARISON:  No similar prior exam is available at this institution for comparison or on [facility] PACS.  FINDINGS: There is evidence of sideplate, screw, and cerclage wire fixation of a comminuted distal right femoral fracture. There has been apparent backing out of the distal femoral screw component with its head protruding from the skin. Comminuted fracture fragments are again noted with 1 full shaft width fracture fragment overlap. Total knee arthroplasty noted and presumed femoral component of total hip arthroplasty partly visualized. Surgical staples are in place.  IMPRESSION: Lateral displacement of femoral fracture fixation hardware indicating hardware failure, with protrusion of a surgical screw through the skin.  Comminuted distal femoral fracture with 1 full shaft width overlap of the fracture fragments.   Electronically Signed   By: Christiana Pellant M.D.   On: 06/16/2013 15:21    Dg C-arm 1-60 Min  06/17/2013   CLINICAL DATA:  Hardware removal.  EXAM: DG C-ARM 1-60 MIN  TECHNIQUE: Six C-arm views submitted for review after procedure  COMPARISON:  06/16/2013.  FLUOROSCOPY  TIME:  40 seconds  FINDINGS: Long stem right total hip prosthesis with proximal distal cerclage wires in place. Long segment sideplate distal femur. The distal aspect of the sideplate has been reapproximated with the distal femoral fracture with screws placed. Post right knee replacement. These findings can be assessed on follow up.  IMPRESSION: Long stem right total hip prosthesis with proximal distal cerclage wires in place.  Long segment sideplate distal femur. The distal aspect of the sideplate has been reapproximated with the distal femoral fracture with screws placed.  Post right knee replacement.  These findings can be assessed on follow up.   Electronically Signed   By: Bridgett Larsson M.D.   On: 06/17/2013 12:24    Microbiology: Recent Results (from the past 240 hour(s))  URINE CULTURE     Status: None   Collection Time    06/13/13  3:30 PM      Result Value Range Status   Specimen Description URINE, RANDOM   Final   Special Requests NONE   Final   Culture  Setup Time     Final   Value: 06/13/2013 20:18     Performed at [facility] Count     Final   Value: >=100,000 COLONIES/ML     Performed at [facility]   Culture     Final   Value: ESCHERICHIA COLI     Performed at [facility]   Report Status 06/15/2013 FINAL   Final   Organism ID, Bacteria ESCHERICHIA COLI   Final  CULTURE, BLOOD (ROUTINE X 2)     Status: None   Collection Time    06/16/13  6:35 PM      Result Value Range Status   Specimen Description BLOOD LEFT ARM   Final   Special Requests BOTTLES DRAWN AEROBIC ONLY 5CC   Final   Culture  Setup Time     Final   Value: 06/17/2013 00:41     Performed at [facility]   Culture     Final   Value:        BLOOD CULTURE RECEIVED NO GROWTH  TO DATE CULTURE WILL BE HELD FOR 5 DAYS BEFORE ISSUING A FINAL NEGATIVE REPORT     Performed at [facility]   Report Status PENDING   Incomplete  CULTURE, BLOOD (ROUTINE X 2)     Status: None   Collection Time    06/16/13  6:45 PM      Result Value Range Status   Specimen Description BLOOD RIGHT HAND   Final   Special Requests BOTTLES DRAWN AEROBIC ONLY 5CC   Final   Culture  Setup Time     Final   Value: 06/17/2013 00:41     Performed at [facility]   Culture     Final   Value:        BLOOD CULTURE RECEIVED NO GROWTH TO DATE CULTURE WILL BE HELD FOR 5 DAYS BEFORE ISSUING A FINAL NEGATIVE REPORT     Performed at [facility]   Report Status PENDING   Incomplete  SURGICAL PCR SCREEN     Status: None   Collection Time    06/17/13  4:36 AM      Result Value Range Status   MRSA, PCR NEGATIVE  NEGATIVE Final   Staphylococcus aureus NEGATIVE  NEGATIVE Final   Comment:            The Xpert SA Assay (  FDA     approved for NASAL specimens     in patients over 5 years of age),     is one component of     a comprehensive surveillance     program.  Test performance has     been validated by [employer] for patients greater     than or equal to 29 year old.     It is not intended     to diagnose infection nor to     guide or monitor treatment.     Labs: Basic Metabolic Panel:  Recent Labs Lab 06/16/13 1411 06/16/13 2225 06/17/13 1215 06/18/13 0650  NA 142 142  --  136  K 4.6 3.7  --  4.4  CL 108 107  --  106  CO2 28 28  --  17*  GLUCOSE 58* 175*  --  157*  BUN 19 18  --  11  CREATININE 0.61 0.69 0.62 0.53  CALCIUM 9.2 8.8  --  8.4   Liver Function Tests: No results found for this basename: AST, ALT, ALKPHOS, BILITOT, PROT, ALBUMIN,  in the last 168 hours No results found for this basename: LIPASE, AMYLASE,  in the last 168 hours No results found for this basename: AMMONIA,  in the last 168 hours CBC:  Recent Labs Lab 06/16/13 1411   06/17/13 1215 06/18/13 0650 06/19/13 0351 06/20/13 0500 06/21/13 0444  WBC 7.6  < > 11.5* 9.2 6.5 6.7 8.6  NEUTROABS 5.2  --   --   --   --   --   --   HGB 11.5*  < > 10.5* 9.8* 9.0* 8.5* 9.5*  HCT 36.5  < > 32.4* 29.9* 28.1* 26.2* 29.4*  MCV 101.1*  < > 97.9 94.3 96.2 96.3 96.1  PLT 510*  < > PLATELETS APPEAR INCREASED 387 333 345 409*  < > = values in this interval not displayed. Cardiac Enzymes: No results found for this basename: CKTOTAL, CKMB, CKMBINDEX, TROPONINI,  in the last 168 hours BNP: BNP (last 3 results) No results found for this basename: PROBNP,  in the last 8760 hours CBG:  Recent Labs Lab 06/20/13 1226 06/20/13 1614 06/20/13 2202 06/21/13 0655 06/21/13 1135  GLUCAP 145* 138* 136* 135* 166*   Signed:  CHIU, STEPHEN K  [facility] 06/21/2013, 12:51 PM

## 2013-06-21 NOTE — Progress Notes (Signed)
ANTIBIOTIC CONSULT NOTE - FOLLOW UP  Pharmacy Consult for vancomycin/cefepime Indication: infected prosthetic knee joint  No Known Allergies  Patient Measurements: Height: 5' 6.93" (170 cm) Weight: 203 lb 11.3 oz (92.4 kg) IBW/kg (Calculated) : 61.44 Adjusted Body Weight: n/a  Vital Signs: Temp: 97.7 F (36.5 C) (12/15 1300) Temp src: Oral (12/15 0652) BP: 119/70 mmHg (12/15 1300) Pulse Rate: 99 (12/15 1300) Intake/Output from previous day: 12/14 0701 - 12/15 0700 In: 2085.6 [P.O.:720; I.V.:1015.6; IV Piggyback:350] Out: 1 [Stool:1] Intake/Output from this shift: Total I/O In: 290 [P.O.:240; IV Piggyback:50] Out: -   Labs:  Recent Labs  06/19/13 0351 06/20/13 0500 06/21/13 0444  WBC 6.5 6.7 8.6  HGB 9.0* 8.5* 9.5*  PLT 333 345 409*   Estimated Creatinine Clearance: 58.8 ml/min (by C-G formula based on Cr of 0.53).  Recent Labs  06/19/13 1845 06/21/13 1430  VANCOTROUGH 11.5 13.9     Microbiology: Recent Results (from the past 720 hour(s))  URINE CULTURE     Status: None   Collection Time    06/13/13  3:30 PM      Result Value Range Status   Specimen Description URINE, RANDOM   Final   Special Requests NONE   Final   Culture  Setup Time     Final   Value: 06/13/2013 20:18     Performed at [facility] Count     Final   Value: >=100,000 COLONIES/ML     Performed at [facility]   Culture     Final   Value: ESCHERICHIA COLI     Performed at [facility]   Report Status 06/15/2013 FINAL   Final   Organism ID, Bacteria ESCHERICHIA COLI   Final  CULTURE, BLOOD (ROUTINE X 2)     Status: None   Collection Time    06/16/13  6:35 PM      Result Value Range Status   Specimen Description BLOOD LEFT ARM   Final   Special Requests BOTTLES DRAWN AEROBIC ONLY 5CC   Final   Culture  Setup Time     Final   Value: 06/17/2013 00:41     Performed at [facility]   Culture     Final   Value:        BLOOD CULTURE RECEIVED  NO GROWTH TO DATE CULTURE WILL BE HELD FOR 5 DAYS BEFORE ISSUING A FINAL NEGATIVE REPORT     Performed at [facility]   Report Status PENDING   Incomplete  CULTURE, BLOOD (ROUTINE X 2)     Status: None   Collection Time    06/16/13  6:45 PM      Result Value Range Status   Specimen Description BLOOD RIGHT HAND   Final   Special Requests BOTTLES DRAWN AEROBIC ONLY 5CC   Final   Culture  Setup Time     Final   Value: 06/17/2013 00:41     Performed at [facility]   Culture     Final   Value:        BLOOD CULTURE RECEIVED NO GROWTH TO DATE CULTURE WILL BE HELD FOR 5 DAYS BEFORE ISSUING A FINAL NEGATIVE REPORT     Performed at [facility]   Report Status PENDING   Incomplete  SURGICAL PCR SCREEN     Status: None   Collection Time    06/17/13  4:36 AM      Result Value Range Status  MRSA, PCR NEGATIVE  NEGATIVE Final   Staphylococcus aureus NEGATIVE  NEGATIVE Final   Comment:            The Xpert SA Assay (FDA     approved for NASAL specimens     in patients over 61 years of age),     is one component of     a comprehensive surveillance     program.  Test performance has     been validated by [employer] for patients greater     than or equal to 57 year old.     It is not intended     to diagnose infection nor to     guide or monitor treatment.    Anti-infectives   Start     Dose/Rate Route Frequency Ordered Stop   06/21/13 0000  dextrose 5 % SOLN 50 mL with ceFEPIme 1 G SOLR 1 g    Comments:  Dispense to last through 07/13/2013, zero refills   1 g 100 mL/hr over 30 Minutes Intravenous Every 12 hours 06/21/13 1253     06/21/13 0000  vancomycin (VANCOCIN) 1 GM/200ML SOLN    Comments:  Dispense to last through 07/13/13, zero refills   1,000 mg 200 mL/hr over 60 Minutes Intravenous Every 12 hours 06/21/13 1253     06/20/13 0300  vancomycin (VANCOCIN) IVPB 1000 mg/200 mL premix     1,000 mg 200 mL/hr over 60 Minutes Intravenous Every 12 hours  06/19/13 2033     06/17/13 1800  vancomycin (VANCOCIN) 750 mg in sodium chloride 0.9 % 150 mL IVPB  Status:  Discontinued     750 mg 150 mL/hr over 60 Minutes Intravenous Every 12 hours 06/17/13 1427 06/19/13 2028   06/17/13 1400  ceFAZolin (ANCEF) IVPB 2 g/50 mL premix  Status:  Discontinued     2 g 100 mL/hr over 30 Minutes Intravenous Every 6 hours 06/17/13 1145 06/17/13 1351   06/17/13 1400  ceFEPIme (MAXIPIME) 1 g in dextrose 5 % 50 mL IVPB     1 g 100 mL/hr over 30 Minutes Intravenous Every 12 hours 06/17/13 1351     06/17/13 0600  ceFAZolin (ANCEF) IVPB 2 g/50 mL premix     2 g 100 mL/hr over 30 Minutes Intravenous On call to O.R. 06/16/13 2155 06/17/13 0744   06/17/13 0400  vancomycin (VANCOCIN) 750 mg in sodium chloride 0.9 % 150 mL IVPB  Status:  Discontinued     750 mg 150 mL/hr over 60 Minutes Intravenous Every 12 hours 06/16/13 1813 06/17/13 1211   06/16/13 1830  ceFEPIme (MAXIPIME) 1 g in dextrose 5 % 50 mL IVPB  Status:  Discontinued     1 g 100 mL/hr over 30 Minutes Intravenous Every 12 hours 06/16/13 1750 06/17/13 1211   06/16/13 1700  piperacillin-tazobactam (ZOSYN) IVPB 3.375 g     3.375 g 100 mL/hr over 30 Minutes Intravenous STAT 06/16/13 1645 06/16/13 1810   06/16/13 1600  vancomycin (VANCOCIN) IVPB 1000 mg/200 mL premix     1,000 mg 200 mL/hr over 60 Minutes Intravenous  Once 06/16/13 1545 06/16/13 1718      Assessment: 79 yo female with infected prosthetic knee joint.  Planning to continue IV vancomycin and cefepime x 4 weeks.  Repeat vanc trough came back at 13.9 which is slightly less than goal. Pt to be discharged today.   Goal of Therapy:  Vancomycin trough level 15-20 mcg/ml  Plan:   Increase  VANCOMYCIN TO 1.25g IV Q12H F/u trough in a few days Cont Cefepime 1g IV q12

## 2013-06-21 NOTE — Progress Notes (Signed)
Physical Therapy Treatment Patient Details Name: Tammy Sharp MRN: [id number] DOB: 1925/04/05 Today's Date: 06/21/2013 Time: 2956-2130 PT Time Calculation (min): 26 min  PT Assessment / Plan / Recommendation  History of Present Illness 1. Infection of the recent surgical correction of distal femoral shaft fracture   PT Comments   Making quite good progress, especially with bed mobility and basic transfers; Pt seems pleasantly surprised with her performance of transfers  Follow Up Recommendations  SNF;Supervision/Assistance - 24 hour     Does the patient have the potential to tolerate intense rehabilitation     Barriers to Discharge        Equipment Recommendations  None recommended by PT    Recommendations for Other Services    Frequency Min 3X/week   Progress towards PT Goals Progress towards PT goals: Progressing toward goals  Plan Current plan remains appropriate    Precautions / Restrictions Precautions Precautions: Fall;Other (comment) Precaution Comments: no ROM of right knee Required Braces or Orthoses: Knee Immobilizer - Right Knee Immobilizer - Right: On at all times Restrictions RLE Weight Bearing: Non weight bearing   Pertinent Vitals/Pain Did not rate, but definitely painful duringtransfer patient repositioned for comfort     Mobility  Bed Mobility Bed Mobility: Supine to Sit;Sitting - Scoot to Edge of Bed Supine to Sit: 4: Min assist;HOB flat;With rails Sitting - Scoot to Edge of Bed: 4: Min assist Details for Bed Mobility Assistance: cues for safety and technique needed; assist for right leg management needed. Transfers Transfers: Sit to Stand;Stand to Sit;Stand Pivot Transfers Sit to Stand: From bed;From elevated surface;3: Mod assist Stand to Sit: To chair/3-in-1;With armrests;3: Mod assist Stand Pivot Transfers: 3: Mod assist Stand Pivot Transfers: Patient Percentage: 60% Details for Transfer Assistance: cues for technique. assist for balance  and to maintain NWB on right leg with transfers. unable to acheive fully upright standing with RW, so sat back to bed and performed basic pivot transfer to chair towards pt's L side Ambulation/Gait Ambulation/Gait Assistance: Not tested (comment)    Exercises Total Joint Exercises Ankle Circles/Pumps: AROM;Both;10 reps;Supine   PT Diagnosis:    PT Problem List:   PT Treatment Interventions:     PT Goals (current goals can now be found in the care plan section) Acute Rehab PT Goals Patient Stated Goal: want to get back to walking like she did before the knee replacment. PT Goal Formulation: With patient Time For Goal Achievement: 06/25/13 Potential to Achieve Goals: Good  Visit Information  Last PT Received On: 06/21/13 Assistance Needed: +1 (for basic pivot) History of Present Illness: 1. Infection of the recent surgical correction of distal femoral shaft fracture    Subjective Data  Subjective: Agreeable to OOB; when asked about how much weight she can put on RLE, she stated "as much as I can tolerate" ; Pt's daughter and myself corrected her to NWB Patient Stated Goal: want to get back to walking like she did before the knee replacment.   Cognition  Cognition Arousal/Alertness: Awake/alert Behavior During Therapy: WFL for tasks assessed/performed Overall Cognitive Status: History of cognitive impairments - at baseline (per nursing)    Balance     End of Session PT - End of Session Equipment Utilized During Treatment: Gait belt;Right knee immobilizer Activity Tolerance: Patient tolerated treatment well Patient left: in chair;with bed alarm set;with family/visitor present Nurse Communication: Mobility status   GP     Olen Pel [place], [place] [phone] 06/21/2013, 12:49 PM

## 2013-06-21 NOTE — Progress Notes (Signed)
CSW (Clinical Social Worker) prepared pt dc packet and placed with shadow chart. CSW arranged non-emergent ambulance transport. Pt family, pt nurse, and facility informed. CSW signing off.  Michalene Debruler, LCSWA [phone]  

## 2013-06-21 NOTE — Progress Notes (Signed)
Patient being discharged to [facility] via ambulance transport. She is in stable condition at this time. Son Jeannett Senior notified of discharge as requested.

## 2013-06-21 NOTE — Progress Notes (Signed)
I participated in the care of this patient and agree with the above history, physical and evaluation. I performed a review of the history and a physical exam as detailed   Timothy Daniel Murphy MD  

## 2013-06-21 NOTE — Progress Notes (Signed)
Patient ID: Tammy Sharp, female   DOB: [birth year], 79 y.o.   MRN: [id number]     Subjective:  Patient reports pain as mild.  Patient not aware of time ad place and also did not remember injury.  Objective:   VITALS:   Filed Vitals:   06/20/13 1548 06/20/13 1900 06/20/13 2108 06/21/13 0652  BP: 118/48  121/66 103/55  Pulse: 80  86 83  Temp: 97.2 F (36.2 C)  97.6 F (36.4 C) 98 F (36.7 C)  TempSrc: Oral  Oral Oral  Resp: 18  18 18   Height:      Weight:  92.4 kg (203 lb 11.3 oz)    SpO2: 98%  94% 97%    ABD soft Sensation intact distally Dorsiflexion/Plantar flexion intact Incision: dressing C/D/I and no drainage   Lab Results  Component Value Date   WBC 8.6 06/21/2013   HGB 9.5* 06/21/2013   HCT 29.4* 06/21/2013   MCV 96.1 06/21/2013   PLT 409* 06/21/2013     Assessment/Plan: 4 Days Post-Op   Principal Problem:   Infected prosthetic knee joint Active Problems:   Post op infection   A-fib   CVA (cerebral infarction)   HTN (hypertension)   DM2 (diabetes mellitus, type 2)   Advance diet Up with therapy Plan per medicine NWB right lower ext. Dry dressing PRN   Haskel Khan 06/21/2013, 8:35 AM   Renaye Rakers, MD [phone]

## 2013-06-22 ENCOUNTER — Encounter (HOSPITAL_COMMUNITY): Payer: Self-pay | Admitting: Orthopedic Surgery

## 2013-06-22 ENCOUNTER — Non-Acute Institutional Stay (SKILLED_NURSING_FACILITY): Payer: Medicare Other | Admitting: Adult Health

## 2013-06-22 DIAGNOSIS — I10 Essential (primary) hypertension: Secondary | ICD-10-CM

## 2013-06-22 DIAGNOSIS — K219 Gastro-esophageal reflux disease without esophagitis: Secondary | ICD-10-CM

## 2013-06-22 DIAGNOSIS — T814XXD Infection following a procedure, subsequent encounter: Secondary | ICD-10-CM

## 2013-06-22 DIAGNOSIS — I4891 Unspecified atrial fibrillation: Secondary | ICD-10-CM

## 2013-06-22 DIAGNOSIS — S8290XS Unspecified fracture of unspecified lower leg, sequela: Secondary | ICD-10-CM

## 2013-06-22 DIAGNOSIS — S7291XS Unspecified fracture of right femur, sequela: Secondary | ICD-10-CM

## 2013-06-22 DIAGNOSIS — T8459XD Infection and inflammatory reaction due to other internal joint prosthesis, subsequent encounter: Secondary | ICD-10-CM

## 2013-06-22 DIAGNOSIS — Z5189 Encounter for other specified aftercare: Secondary | ICD-10-CM

## 2013-06-23 ENCOUNTER — Encounter: Payer: Self-pay | Admitting: Adult Health

## 2013-06-23 DIAGNOSIS — K219 Gastro-esophageal reflux disease without esophagitis: Secondary | ICD-10-CM | POA: Insufficient documentation

## 2013-06-23 DIAGNOSIS — S7291XA Unspecified fracture of right femur, initial encounter for closed fracture: Secondary | ICD-10-CM | POA: Insufficient documentation

## 2013-06-23 LAB — CULTURE, BLOOD (ROUTINE X 2): Culture: NO GROWTH

## 2013-06-23 NOTE — Progress Notes (Signed)
Patient ID: Tammy Sharp, female   DOB: 04-09-1925, 79 y.o.   MRN: [id number]     [facility]  No Known Allergies   Chief Complaint  Patient presents with  . Hospitalization Follow-up    HPI:  She had been hospitalized for a right femur orif due to a fracture; she was then admitted to this facility for rehab.  She developed wound dehiscence with screw exposure and required further surgical intervention. She is being treated for surgical infection. She is here for short term rehab with the goal to return back home.   Past Medical History  Diagnosis Date  . Hypertension   . Diabetes mellitus without complication   . Stroke   . A-fib     Past Surgical History  Procedure Laterality Date  . Joint replacement    . Fracture surgery    . Orif femur decompression Right 2014    R mid femur fracture, between R hip and kneee prosthesis  . Orif femur fracture Right 06/17/2013    Procedure: OPEN REDUCTION INTERNAL FIXATION (ORIF) DISTAL FEMUR FRACTURE;  Surgeon: Sheral Apley, MD;  Location: [facility] OR;  Service: Orthopedics;  Laterality: Right;    VITAL SIGNS BP 98/59  Pulse 77  Ht 5\' 7"  (1.702 m)  Wt 200 lb (90.719 kg)  BMI 31.32 kg/m2   Patient's Medications  New Prescriptions   No medications on file  Previous Medications   ACETAMINOPHEN (TYLENOL) 325 MG TABLET    Take 650 mg by mouth every 6 (six) hours as needed for mild pain.   ATENOLOL (TENORMIN) 50 MG TABLET    Take 50 mg by mouth daily.   DEXTROSE 5 % SOLN 50 ML WITH CEFEPIME 1 G SOLR 1 G    Inject 1 g into the vein every 12 (twelve) hours.   DOCUSATE SODIUM (COLACE) 100 MG CAPSULE    Take 100 mg by mouth 2 (two) times daily.   HYDROCODONE-ACETAMINOPHEN (NORCO/VICODIN) 5-325 MG PER TABLET    Take 1-2 tablets by mouth every 4 (four) hours as needed for moderate pain.   INSULIN DETEMIR (LEVEMIR) 100 UNIT/ML INJECTION    Inject 15 Units into the skin 2 (two) times daily.   INSULIN GLULISINE (APIDRA) 100 UNIT/ML  INJECTION    Inject 5 Units into the skin 3 (three) times daily before meals.   IRON POLYSACCHARIDES (NIFEREX) 150 MG CAPSULE    Take 150 mg by mouth daily.   LATANOPROST (XALATAN) 0.005 % OPHTHALMIC SOLUTION    Place 1 drop into both eyes at bedtime.   METHOCARBAMOL (ROBAXIN) 500 MG TABLET    Take 500 mg by mouth 3 (three) times daily as needed for muscle spasms.   NITROGLYCERIN (NITROSTAT) 0.4 MG SL TABLET    Place 0.4 mg under the tongue every 5 (five) minutes as needed for chest pain.   OMEPRAZOLE (PRILOSEC) 20 MG CAPSULE    Take 20 mg by mouth daily.   ONDANSETRON (ZOFRAN) 4 MG/2ML SOLN INJECTION    Inject 4 mg into the vein every 8 (eight) hours as needed for nausea or vomiting.   POLYETHYLENE GLYCOL (MIRALAX / GLYCOLAX) PACKET    Take 17 g by mouth daily as needed for moderate constipation or severe constipation.   VANCOMYCIN (VANCOCIN) 1 GM/200ML SOLN    Inject 200 mLs (1,000 mg total) into the vein every 12 (twelve) hours.   WARFARIN (COUMADIN) 5 MG TABLET    Take 5 mg by mouth at bedtime.  Modified Medications  No medications on file  Discontinued Medications   No medications on file    SIGNIFICANT DIAGNOSTIC EXAMS  06-16-13: right knee x-ray: Lateral displacement of femoral fracture fixation hardware indicating hardware failure, with protrusion of a surgical screw through the skin. Comminuted distal femoral fracture with 1 full shaft width overlap of the fracture fragments.  06-16-13: right femur x-ray: Prior right hip and knee replacements. Prior ORIF of a distal femoral diaphyseal fracture with displacement of the dominant distal femoral fragment from the plate. Inferior most screw extends has withdrawn from the plate and extends external to distal femoral soft tissues.  06-16-13: 2-d echo: Left ventricle: The cavity size was normal. Wall thickness was normal. Systolic function was normal. The estimated ejection fraction was in the range of 55% to 60%. Wall motion was normal;  there were no regional wall motion abnormalities. - Mitral valve: Mild regurgitation. - Right atrium: The atrium was mildly dilated.   06-16-13: chest x-ray: No active disease.     LABS REVIEWED:   06-14-13: wbc 12.-; hgb 10.8; hct 35.7; mcv 102.9; plt 585; glucose 145; bun 16; creat 0.7; k+3.8; na++141 liver normal albumin 3.1  06-16-13: hgb a1c 5.5 06-18-13: wbc 9.2; hgb 9.8; hct 29.9; mcv 94.3; plt 387; glucose 157; bun 11; creat 0.53; k+4.4; na++136    Review of Systems  Constitutional: Negative for malaise/fatigue.  Respiratory: Negative for cough and shortness of breath.   Cardiovascular: Negative for chest pain and palpitations.  Gastrointestinal: Negative for heartburn and constipation.  Musculoskeletal: Negative for joint pain and myalgias.  Skin: Negative.   Neurological: Negative for weakness and headaches.  Psychiatric/Behavioral: Negative for depression. The patient is not nervous/anxious.     Physical Exam  Constitutional: She is oriented to person, place, and time. She appears well-developed and well-nourished. No distress.  overweight  Neck: Neck supple. No JVD present.  Cardiovascular: Normal rate, regular rhythm and intact distal pulses.   Respiratory: Effort normal and breath sounds normal. No respiratory distress. She has no wheezes.  GI: Soft. Bowel sounds are normal. She exhibits no distension. There is no tenderness.  Musculoskeletal: She exhibits no edema.  Right leg immobilizer in place   Neurological: She is alert and oriented to person, place, and time.  Skin: Skin is warm and dry. She is not diaphoretic.  Incision line with scant amount of serous drainage present.  picc line in place  Psychiatric: She has a normal mood and affect.     ASSESSMENT/ PLAN:  1. Postop infection with wound dehiscence will continue vancomycin and cefepime 1 gm twice daily through 07-13-13 and will follow up with I/d as directed  2. Diabetes: is stable will continue  levemir 15 units twice daily and aprida 5 units prior to meals will monitor   3. Femur fracture: will continue therapy as directed; will continue vicodin 5/325 mg 1-2 tabs every 4 hours as needed and robaxin 500 mg three times daily as needed.   4. Hypertension: will continue atenolol 50 mg daily and will monitor   5. Afib: is stable heart rate under control will continue coumadin therapy   6. Genella Rife: will continue prilosec 20 mg daily  7. Constipation: will continue colace twice daily and miralax daily as needed     Time spent with patient 50 minutes.

## 2013-06-24 ENCOUNTER — Non-Acute Institutional Stay (SKILLED_NURSING_FACILITY): Payer: Medicare Other | Admitting: Internal Medicine

## 2013-06-24 ENCOUNTER — Other Ambulatory Visit: Payer: Self-pay | Admitting: *Deleted

## 2013-06-24 ENCOUNTER — Encounter: Payer: Self-pay | Admitting: Internal Medicine

## 2013-06-24 DIAGNOSIS — D62 Acute posthemorrhagic anemia: Secondary | ICD-10-CM

## 2013-06-24 DIAGNOSIS — E119 Type 2 diabetes mellitus without complications: Secondary | ICD-10-CM

## 2013-06-24 DIAGNOSIS — I4891 Unspecified atrial fibrillation: Secondary | ICD-10-CM

## 2013-06-24 DIAGNOSIS — S72301S Unspecified fracture of shaft of right femur, sequela: Secondary | ICD-10-CM

## 2013-06-24 DIAGNOSIS — IMO0002 Reserved for concepts with insufficient information to code with codable children: Secondary | ICD-10-CM

## 2013-06-24 DIAGNOSIS — T8450XS Infection and inflammatory reaction due to unspecified internal joint prosthesis, sequela: Secondary | ICD-10-CM

## 2013-06-24 DIAGNOSIS — K219 Gastro-esophageal reflux disease without esophagitis: Secondary | ICD-10-CM

## 2013-06-24 DIAGNOSIS — T889XXS Complication of surgical and medical care, unspecified, sequela: Secondary | ICD-10-CM

## 2013-06-24 DIAGNOSIS — S8290XS Unspecified fracture of unspecified lower leg, sequela: Secondary | ICD-10-CM

## 2013-06-24 DIAGNOSIS — I10 Essential (primary) hypertension: Secondary | ICD-10-CM

## 2013-06-24 DIAGNOSIS — IMO0001 Reserved for inherently not codable concepts without codable children: Secondary | ICD-10-CM

## 2013-06-24 MED ORDER — HYDROCODONE-ACETAMINOPHEN 5-325 MG PO TABS: ORAL_TABLET | ORAL | Status: DC

## 2013-06-25 ENCOUNTER — Encounter: Payer: Self-pay | Admitting: Internal Medicine

## 2013-06-25 DIAGNOSIS — T8450XA Infection and inflammatory reaction due to unspecified internal joint prosthesis, initial encounter: Secondary | ICD-10-CM | POA: Insufficient documentation

## 2013-06-25 DIAGNOSIS — S72301A Unspecified fracture of shaft of right femur, initial encounter for closed fracture: Secondary | ICD-10-CM | POA: Insufficient documentation

## 2013-06-25 DIAGNOSIS — IMO0001 Reserved for inherently not codable concepts without codable children: Secondary | ICD-10-CM | POA: Insufficient documentation

## 2013-06-25 DIAGNOSIS — D62 Acute posthemorrhagic anemia: Secondary | ICD-10-CM | POA: Insufficient documentation

## 2013-06-25 NOTE — Progress Notes (Signed)
Patient ID: Tammy Sharp, female   DOB: December 08, 1924, 79 y.o.   MRN: [id number]    [facility]    PCP: Pcp Not In System  No Known Allergies  Chief Complaint: new admission  HPI:  79 y/o female patient is here for STR. She was initially here post ORIF due to a distal femoral shaft fracture for rehab. She then started having wound dehiscence and her hardware was exposed requiring further intervention surgically. She was then sent to the hospital and was there from 06/16/13- 06/21/13 with infected prosthetic. Orthopedic and ID were consulted, she underwent surgical correction of the hardware and was started on iv antibiotics. She is on vancomycin and cefepime until 07/13/13.  She was seen in her room. She appears to have some confusion and is repetitive which is a change from her prior admission. She complaints of muscle tightness around the surgery site intermittently but mostly at night time. She has not been asking for muscle relaxant which are prn orders. She also complaints of her right leg hurting.  Review of Systems  Constitutional: Negative for fever, chills, weight loss, malaise/fatigue and diaphoresis.  HENT: Negative for congestion, hearing loss and sore throat.   Eyes: Negative for blurred vision, double vision and discharge.  Respiratory: Negative for cough, sputum production, shortness of breath and wheezing.   Cardiovascular: Negative for chest pain, palpitations, orthopnea and leg swelling.  Gastrointestinal: Negative for heartburn, nausea, vomiting, abdominal pain, diarrhea and constipation.  Genitourinary: Negative for dysuria, urgency, frequency and flank pain.  Musculoskeletal: Negative for back pain, falls, myalgias.  Skin: Negative for itching and rash.  Neurological: Positive for weakness and tingling in right foot. Negative for dizziness, focal weakness and headaches.  Psychiatric/Behavioral: Negative for depression. The patient is not nervous/anxious.     Past Medical History  Diagnosis Date  . Hypertension   . Diabetes mellitus without complication   . Stroke   . A-fib    Past Surgical History  Procedure Laterality Date  . Joint replacement    . Fracture surgery    . Orif femur decompression Right 2014    R mid femur fracture, between R hip and kneee prosthesis  . Orif femur fracture Right 06/17/2013    Procedure: OPEN REDUCTION INTERNAL FIXATION (ORIF) DISTAL FEMUR FRACTURE;  Surgeon: Sheral Apley, MD;  Location: [facility] OR;  Service: Orthopedics;  Laterality: Right;   Social History:   reports that she has never smoked. She does not have any smokeless tobacco history on file. She reports that she does not drink alcohol or use illicit drugs.  Family History  Problem Relation Age of Onset  . Cancer Brother     leukemia    Medications: Patient's Medications  New Prescriptions   No medications on file  Previous Medications   ACETAMINOPHEN (TYLENOL) 325 MG TABLET    Take 650 mg by mouth every 6 (six) hours as needed for mild pain.   ATENOLOL (TENORMIN) 50 MG TABLET    Take 50 mg by mouth daily.   DEXTROSE 5 % SOLN 50 ML WITH CEFEPIME 1 G SOLR 1 G    Inject 1 g into the vein every 12 (twelve) hours.   DOCUSATE SODIUM (COLACE) 100 MG CAPSULE    Take 100 mg by mouth 2 (two) times daily.   HYDROCODONE-ACETAMINOPHEN (NORCO/VICODIN) 5-325 MG PER TABLET    Take one tablet by mouth twice daily at 8am and 8pm for post surgery pain. Hold for sedation  INSULIN DETEMIR (LEVEMIR) 100 UNIT/ML INJECTION    Inject 15 Units into the skin 2 (two) times daily.   INSULIN GLULISINE (APIDRA) 100 UNIT/ML INJECTION    Inject 5 Units into the skin 3 (three) times daily before meals.   IRON POLYSACCHARIDES (NIFEREX) 150 MG CAPSULE    Take 150 mg by mouth daily.   LATANOPROST (XALATAN) 0.005 % OPHTHALMIC SOLUTION    Place 1 drop into both eyes at bedtime.   METHOCARBAMOL (ROBAXIN) 500 MG TABLET    Take 500 mg by mouth 3 (three) times daily as needed for  muscle spasms.   NITROGLYCERIN (NITROSTAT) 0.4 MG SL TABLET    Place 0.4 mg under the tongue every 5 (five) minutes as needed for chest pain.   OMEPRAZOLE (PRILOSEC) 20 MG CAPSULE    Take 20 mg by mouth daily.   ONDANSETRON (ZOFRAN) 4 MG/2ML SOLN INJECTION    Inject 4 mg into the vein every 8 (eight) hours as needed for nausea or vomiting.   POLYETHYLENE GLYCOL (MIRALAX / GLYCOLAX) PACKET    Take 17 g by mouth daily as needed for moderate constipation or severe constipation.   VANCOMYCIN (VANCOCIN) 1 GM/200ML SOLN    Inject 200 mLs (1,000 mg total) into the vein every 12 (twelve) hours.   WARFARIN (COUMADIN) 5 MG TABLET    Take 5 mg by mouth at bedtime.  Modified Medications   No medications on file  Discontinued Medications   No medications on file     Physical Exam:  133/65, 58, 18, 95%, 97.1  Constitutional: She is oriented to person and place. She appears well-developed and well-nourished. No distress.  Neck: Neck supple. No JVD present.  Cardiovascular: Normal rate, regular rhythm and intact distal pulses.   Respiratory: Effort normal and breath sounds normal. No respiratory distress. She has no wheezes.  GI: Soft. Bowel sounds are normal. She exhibits no distension. There is no tenderness.  Musculoskeletal: She exhibits trace edema in right leg. Suture in place at incision site with blood and serous drainage from one of the suture site, dressing is soaked with the drainage, distal pulses palable, normal skin color, normal warmth Neurological: She is alert and oriented to person, place Skin: Skin is warm and dry. She is not diaphoretic.  picc line in place, mild bruising around the site Psychiatric: She has a normal mood and affect.    Labs reviewed: Basic Metabolic Panel:  Recent Labs  16/10/96 1411 06/16/13 2225 06/17/13 1215 06/18/13 0650  NA 142 142  --  136  K 4.6 3.7  --  4.4  CL 108 107  --  106  CO2 28 28  --  17*  GLUCOSE 58* 175*  --  157*  BUN 19 18  --  11   CREATININE 0.61 0.69 0.62 0.53  CALCIUM 9.2 8.8  --  8.4   CBC:  Recent Labs  06/13/13 1424 06/16/13 1411  06/19/13 0351 06/20/13 0500 06/21/13 0444  WBC 8.4 7.6  < > 6.5 6.7 8.6  NEUTROABS 6.3 5.2  --   --   --   --   HGB 11.3* 11.5*  < > 9.0* 8.5* 9.5*  HCT 35.8* 36.5  < > 28.1* 26.2* 29.4*  MCV 100.3* 101.1*  < > 96.2 96.3 96.1  PLT 535* 510*  < > 333 345 409*  < > = values in this interval not displayed.  CBG:  Recent Labs  06/20/13 2202 06/21/13 0655 06/21/13 1135  GLUCAP 136*  135* 166*    Radiological Exams: Dg Femur Right  06/17/2013   CLINICAL DATA:  Intra op right femur ORIF  EXAM: RIGHT FEMUR - 2 VIEW  COMPARISON:  06/16/2013  FINDINGS: A total right hip arthroplasty is appreciated with cerclage wiring along the proximal femur. The femoral component appears intact. Lateral buttress plate and cannulated screw fixation is identified along the distal femur with cerclage wiring. A total right knee arthroplasty is appreciated.  IMPRESSION: ORIF right femur   Electronically Signed   By: Salome Holmes M.D.   On: 06/17/2013 11:47   Dg Femur Right  06/16/2013   CLINICAL DATA:  Recent surgery, fall on 06/01/2013, screw poking os skin, draining pus and blood, history hypertension, diabetes  EXAM: RIGHT FEMUR - 2 VIEW  COMPARISON:  None  FINDINGS: Long stem of a right femoral prosthesis extends to the distal femur.  Components of the right knee prosthesis are identified in expected positions.  Lateral plate with cerclage wires and 4 distal screws identified post ORIF of a distal femoral diaphyseal fracture.  Fracture demonstrates mild apex lateral angulation and slight medial displacement.  Inferior most screw has withdrawn from the plate and extends to the skin surface.  A gap is present between the lateral plate and the distal femur.  Visualized right pelvis intact.  Soft tissue swelling and skin clips identified at the distal thigh to the knee.  IMPRESSION: Prior right hip  and knee replacements.  Prior ORIF of a distal femoral diaphyseal fracture with displacement of the dominant distal femoral fragment from the plate.  Inferior most screw extends has withdrawn from the plate and extends external to distal femoral soft tissues.   Electronically Signed   By: Ulyses Southward M.D.   On: 06/16/2013 16:19   Dg Chest Port 1 View  06/18/2013   CLINICAL DATA:  PICC placement.  Infected prosthetic knee joint.  EXAM: PORTABLE CHEST - 1 VIEW  COMPARISON:  Chest x-ray dated 06/17/2013  FINDINGS: PICC tip is in the superior vena cava in good position just below the carina. There is a 6 mm faint density overlying the right upper lung zone which I think represents a bone island in the right 2nd rib.  Heart size is normal. There is slight prominence of the main pulmonary artery.  No infiltrates or effusions.  IMPRESSION: PICC in good position.   Electronically Signed   By: Geanie Cooley M.D.   On: 06/18/2013 15:28   Dg Chest Port 1 View  06/17/2013   CLINICAL DATA:  Preop for right femoral fracture  EXAM: PORTABLE CHEST - 1 VIEW  COMPARISON:  None.  FINDINGS: Cardiomediastinal silhouette is unremarkable. Elevation of the right hemidiaphragm. Mild atherosclerotic calcifications of thoracic aorta. No acute infiltrate or pulmonary edema.  IMPRESSION: No active disease.   Electronically Signed   By: Natasha Mead M.D.   On: 06/17/2013 08:14   Dg Knee Complete 4 Views Right  06/16/2013   CLINICAL DATA:  Fall, recent femoral fixation, presumably at an outside institution  EXAM: RIGHT KNEE - COMPLETE 4+ VIEW  COMPARISON:  No similar prior exam is available at this institution for comparison or on [facility] PACS.  FINDINGS: There is evidence of sideplate, screw, and cerclage wire fixation of a comminuted distal right femoral fracture. There has been apparent backing out of the distal femoral screw component with its head protruding from the skin. Comminuted fracture fragments are again noted with 1 full  shaft width fracture fragment overlap. Total  knee arthroplasty noted and presumed femoral component of total hip arthroplasty partly visualized. Surgical staples are in place.  IMPRESSION: Lateral displacement of femoral fracture fixation hardware indicating hardware failure, with protrusion of a surgical screw through the skin.  Comminuted distal femoral fracture with 1 full shaft width overlap of the fracture fragments.   Electronically Signed   By: Christiana Pellant M.D.   On: 06/16/2013 15:21   Dg C-arm 1-60 Min  06/17/2013   CLINICAL DATA:  Hardware removal.  EXAM: DG C-ARM 1-60 MIN  TECHNIQUE: Six C-arm views submitted for review after procedure  COMPARISON:  06/16/2013.  FLUOROSCOPY TIME:  40 seconds  FINDINGS: Long stem right total hip prosthesis with proximal distal cerclage wires in place. Long segment sideplate distal femur. The distal aspect of the sideplate has been reapproximated with the distal femoral fracture with screws placed. Post right knee replacement. These findings can be assessed on follow up.  IMPRESSION: Long stem right total hip prosthesis with proximal distal cerclage wires in place.  Long segment sideplate distal femur. The distal aspect of the sideplate has been reapproximated with the distal femoral fracture with screws placed.  Post right knee replacement.  These findings can be assessed on follow up.   Electronically Signed   By: Bridgett Larsson M.D.   On: 06/17/2013 12:24    Assessment/Plan  ASSESSMENT/ PLAN:  1. Postop infection with wound dehiscence- will continue vancomycin and cefepime 1 gm twice daily through 07-13-13, vancomycin trough to be followed. Monitor wbc and temp curve, to set ID follow up appointment  2. Bleeding from incision site- persistent since readmission as per wound nurse and nursing supervisor. The bleeding site can be probed but unable to provide packing in this area to help stop bleeding, suture present around it. Pt is on coumadin with last inr  2.3. Will stop coumadin for now to reduce further bleeding. Check cbc, inr in am. Will provide referral to orthopedic ASAP for assessment and intervention if needed for the bleed at the incision site  3. Diabetes- continue levemir 15 units twice daily and aprida 5 units prior to meals and monitor cbg  4. Femur fracture- change vicodin to 5-325 mg 1 tab q12h and q4h prn for breakthrough pain and d/c 2 tab prn for now. Also will change robaxin to 500 mg qhs and continue prn. Will need to assess her mental status and hold these fr sedation. Will rule out electrolyte imbaance given her cramps- check bmp, mg level  5. Hypertension- will continue atenolol 50 mg daily   6. Genella Rife- will continue prilosec 20 mg daily  7. Constipation- will continue colace twice daily and miralax daily as needed   8.  Afib- is stable heart rate under control. Continue atenolol. Will hold coumadin for now until reviewing labs in am    9. Anemia- continue iron supplement for now, check h/h in am  Family/ staff Communication: reviewed care plan with patient and nursing supervisor   Goals of care: STR   Labs/tests ordered: cbc, cmp, inr, magnesium, orthopedic referral, ID follow up

## 2013-06-25 NOTE — Progress Notes (Signed)
This encounter was created in error - please disregard.

## 2013-06-27 NOTE — Progress Notes (Signed)
This encounter was created in error - please disregard.

## 2013-06-28 ENCOUNTER — Non-Acute Institutional Stay (SKILLED_NURSING_FACILITY): Payer: Medicare Other | Admitting: Adult Health

## 2013-06-28 DIAGNOSIS — I4891 Unspecified atrial fibrillation: Secondary | ICD-10-CM

## 2013-06-28 DIAGNOSIS — Z7901 Long term (current) use of anticoagulants: Secondary | ICD-10-CM

## 2013-06-30 ENCOUNTER — Non-Acute Institutional Stay (SKILLED_NURSING_FACILITY): Payer: Medicare Other | Admitting: Adult Health

## 2013-06-30 DIAGNOSIS — I4891 Unspecified atrial fibrillation: Secondary | ICD-10-CM

## 2013-06-30 DIAGNOSIS — Z7901 Long term (current) use of anticoagulants: Secondary | ICD-10-CM

## 2013-07-03 ENCOUNTER — Other Ambulatory Visit: Payer: Self-pay

## 2013-07-03 LAB — CREATININE, SERUM
Creatinine: 0.62 mg/dL (ref 0.60–1.30)
EGFR (Non-African Amer.): >60

## 2013-07-03 LAB — BUN: BUN: 10 mg/dL (ref 7–18)

## 2013-07-04 ENCOUNTER — Encounter: Payer: Self-pay | Admitting: Adult Health

## 2013-07-04 DIAGNOSIS — Z7901 Long term (current) use of anticoagulants: Secondary | ICD-10-CM | POA: Insufficient documentation

## 2013-07-04 NOTE — Progress Notes (Signed)
Patient ID: Tammy Sharp, female   DOB: [birth year], 79 y.o.   MRN: [id number]     [facility]  No Known Allergies   Chief Complaint  Patient presents with  . Acute Visit    labs and coumadin management     HPI:  Her inr is 1.5 she is taking coumadin 5 mg daily for her afib she is also on abt for her infected knee joint. She will require coumadin adjustment at this time. Her magnesium level is low and will require supplement. She is not voicing any complaints.   Past Medical History  Diagnosis Date  . Hypertension   . Diabetes mellitus without complication   . Stroke   . A-fib     Past Surgical History  Procedure Laterality Date  . Joint replacement    . Fracture surgery    . Orif femur decompression Right 2014    R mid femur fracture, between R hip and kneee prosthesis  . Orif femur fracture Right 06/17/2013    Procedure: OPEN REDUCTION INTERNAL FIXATION (ORIF) DISTAL FEMUR FRACTURE;  Surgeon: Sheral Apley, MD;  Location: [facility] OR;  Service: Orthopedics;  Laterality: Right;    VITAL SIGNS BP 124/62  Pulse 68  Ht 5\' 7"  (1.702 m)  Wt 200 lb (90.719 kg)  BMI 31.32 kg/m2   Patient's Medications  New Prescriptions   No medications on file  Previous Medications   ACETAMINOPHEN (TYLENOL) 325 MG TABLET    Take 650 mg by mouth every 6 (six) hours as needed for mild pain.   ATENOLOL (TENORMIN) 50 MG TABLET    Take 50 mg by mouth daily.   DEXTROSE 5 % SOLN 50 ML WITH CEFEPIME 1 G SOLR 1 G    Inject 1 g into the vein every 12 (twelve) hours.   DOCUSATE SODIUM (COLACE) 100 MG CAPSULE    Take 100 mg by mouth 2 (two) times daily.   HYDROCODONE-ACETAMINOPHEN (NORCO/VICODIN) 5-325 MG PER TABLET    Take one tablet by mouth twice daily at 8am and 8pm for post surgery pain. Hold for sedation   INSULIN DETEMIR (LEVEMIR) 100 UNIT/ML INJECTION    Inject 15 Units into the skin 2 (two) times daily.   INSULIN GLULISINE (APIDRA) 100 UNIT/ML INJECTION    Inject 5 Units into the skin  3 (three) times daily before meals.   INSULIN LISPRO (HUMALOG) 100 UNIT/ML INJECTION    Inject 5 Units into the skin 3 (three) times daily before meals.   IRON POLYSACCHARIDES (NIFEREX) 150 MG CAPSULE    Take 150 mg by mouth daily.   LATANOPROST (XALATAN) 0.005 % OPHTHALMIC SOLUTION    Place 1 drop into both eyes at bedtime.   METHOCARBAMOL (ROBAXIN) 500 MG TABLET    Take 500 mg by mouth 3 (three) times daily as needed for muscle spasms.   NITROGLYCERIN (NITROSTAT) 0.4 MG SL TABLET    Place 0.4 mg under the tongue every 5 (five) minutes as needed for chest pain.   OMEPRAZOLE (PRILOSEC) 20 MG CAPSULE    Take 20 mg by mouth daily.   ONDANSETRON (ZOFRAN) 4 MG/2ML SOLN INJECTION    Take 4 mg by mouth every 8 (eight) hours as needed for nausea or vomiting.    POLYETHYLENE GLYCOL (MIRALAX / GLYCOLAX) PACKET    Take 17 g by mouth daily as needed for moderate constipation or severe constipation.   VANCOMYCIN (VANCOCIN) 1 GM/200ML SOLN    Inject 200 mLs (1,000 mg total) into  the vein every 12 (twelve) hours.   WARFARIN (COUMADIN) 5 MG TABLET    Take 5 mg by mouth at bedtime.  Modified Medications   No medications on file  Discontinued Medications   No medications on file    SIGNIFICANT DIAGNOSTIC EXAMS   06-16-13: right knee x-ray: Lateral displacement of femoral fracture fixation hardware indicating hardware failure, with protrusion of a surgical screw through the skin. Comminuted distal femoral fracture with 1 full shaft width overlap of the fracture fragments.  06-16-13: right femur x-ray: Prior right hip and knee replacements. Prior ORIF of a distal femoral diaphyseal fracture with displacement of the dominant distal femoral fragment from the plate. Inferior most screw extends has withdrawn from the plate and extends external to distal femoral soft tissues.  06-16-13: 2-d echo: Left ventricle: The cavity size was normal. Wall thickness was normal. Systolic function was normal. The estimated  ejection fraction was in the range of 55% to 60%. Wall motion was normal; there were no regional wall motion abnormalities. - Mitral valve: Mild regurgitation. - Right atrium: The atrium was mildly dilated.   06-16-13: chest x-ray: No active disease.     LABS REVIEWED:   06-14-13: wbc 12.-; hgb 10.8; hct 35.7; mcv 102.9; plt 585; glucose 145; bun 16; creat 0.7; k+3.8; na++141 liver normal albumin 3.1  06-16-13: hgb a1c 5.5 06-18-13: wbc 9.2; hgb 9.8; hct 29.9; mcv 94.3; plt 387; glucose 157; bun 11; creat 0.53; k+4.4; na++136 06-25-13: wbc 5.6; hgb 9.1; hct 32.4 ;mcv 100.3; plt 412; glucose 129; bun 12; creat 0.5; k+3.6; na++144; liver normal albumin 2.8; mag 1.8  INR 1.5     Review of Systems  Constitutional: Negative for malaise/fatigue.  Respiratory: Negative for cough and shortness of breath.   Cardiovascular: Negative for chest pain and palpitations.  Gastrointestinal: Negative for heartburn and constipation.  Musculoskeletal: Negative for joint pain and myalgias.  Skin: Negative.   Neurological: Negative for weakness and headaches.  Psychiatric/Behavioral: Negative for depression. The patient is not nervous/anxious.     Physical Exam  Constitutional: She is oriented to person, place, and time. She appears well-developed and well-nourished. No distress.  overweight  Neck: Neck supple. No JVD present.  Cardiovascular: Normal rate, regular rhythm and intact distal pulses.   Respiratory: Effort normal and breath sounds normal. No respiratory distress. She has no wheezes.  GI: Soft. Bowel sounds are normal. She exhibits no distension. There is no tenderness.  Musculoskeletal: She exhibits no edema.  Right leg immobilizer in place   Neurological: She is alert and oriented to person, place, and time.  Skin: Skin is warm and dry. She is not diaphoretic.  Incision line with scant amount of serous drainage present.  picc line in place  Psychiatric: She has a normal mood and  affect.      ASSESSMENT/ PLAN:  1. Afib/coagulation management. Her afib is stable; her inr is 1.5 will being coumadin 6 mg daily and will check inr in 2 days will monitor her status   2. Hypomagnesemia: will begin mag ox 400 mg daily and will monitor

## 2013-07-05 ENCOUNTER — Non-Acute Institutional Stay (SKILLED_NURSING_FACILITY): Payer: Medicare Other | Admitting: Adult Health

## 2013-07-05 DIAGNOSIS — Z7901 Long term (current) use of anticoagulants: Secondary | ICD-10-CM

## 2013-07-05 DIAGNOSIS — I4891 Unspecified atrial fibrillation: Secondary | ICD-10-CM

## 2013-07-06 ENCOUNTER — Encounter (HOSPITAL_COMMUNITY): Payer: Self-pay | Admitting: Orthopedic Surgery

## 2013-07-07 ENCOUNTER — Non-Acute Institutional Stay (SKILLED_NURSING_FACILITY): Payer: Medicare Other | Admitting: Adult Health

## 2013-07-07 DIAGNOSIS — I4891 Unspecified atrial fibrillation: Secondary | ICD-10-CM

## 2013-07-07 DIAGNOSIS — Z7901 Long term (current) use of anticoagulants: Secondary | ICD-10-CM

## 2013-07-14 ENCOUNTER — Non-Acute Institutional Stay (SKILLED_NURSING_FACILITY): Payer: Medicare Other | Admitting: Adult Health

## 2013-07-14 ENCOUNTER — Ambulatory Visit (INDEPENDENT_AMBULATORY_CARE_PROVIDER_SITE_OTHER): Payer: Medicare Other | Admitting: Infectious Diseases

## 2013-07-14 ENCOUNTER — Encounter: Payer: Self-pay | Admitting: Infectious Diseases

## 2013-07-14 VITALS — BP 145/84 | HR 99 | Temp 97.8°F

## 2013-07-14 DIAGNOSIS — E119 Type 2 diabetes mellitus without complications: Secondary | ICD-10-CM

## 2013-07-14 DIAGNOSIS — T8450XD Infection and inflammatory reaction due to unspecified internal joint prosthesis, subsequent encounter: Secondary | ICD-10-CM

## 2013-07-14 DIAGNOSIS — Z7901 Long term (current) use of anticoagulants: Secondary | ICD-10-CM

## 2013-07-14 DIAGNOSIS — I4891 Unspecified atrial fibrillation: Secondary | ICD-10-CM

## 2013-07-14 DIAGNOSIS — Z5189 Encounter for other specified aftercare: Secondary | ICD-10-CM

## 2013-07-14 NOTE — Assessment & Plan Note (Signed)
She appears to be doing well. She has completed 4 weeks of anbx. She did not have Cx to assist in guiding her tx. Will pull her PIC and stop her anbx. She can f/u with her PMD and ortho, return to ID prn, wound d/c fever, erythema.

## 2013-07-14 NOTE — Progress Notes (Signed)
   Subjective:    Patient ID: Tammy Sharp, female    DOB: Jun 05, 1925, 80 y.o.   MRN: [id number]  HPI 80 yo F with hx of DM and R THR 02-2012 with revision ~ 07-2012. She had C diff during this repair.  While traveling, she sustained a fracture to her R femur 05-31-13. She underwent surgical repair of this (rod and screws?) and was ultimately d/c to [facility] on 06-09-13. She was noted to have d/c from the inferior portion of her wound on 12-7, bloody. She had temp 99.7. She was seen in ED, told she had a UTI and started on keflex. On 06-16-13 she again had a "gush of blood" from her wound and was noted to have protruding hardware. She had plain films showing migration of her hardware per pt's family. She was brought to ED and admitted. She was taken to OR and underwent ORIF but no Cx sent as she did not have si/sx of infection. She was started on vanco/cefepime and planned for a 4 week course (07-13-13). She was also noted to have E coli UTI in hospital.  Today at f/u she states her leg is still leaking some. She was d/c to SNF as non-wt bearing.  States her FSG have been alright but that she never pays attention to the actual #.   Review of Systems  Constitutional: Negative for appetite change.  Gastrointestinal: Negative for diarrhea and constipation.  Genitourinary: Negative for vaginal discharge and difficulty urinating.  Skin: Negative for rash.       Objective:   Physical Exam  Constitutional: She appears well-developed and well-nourished.  HENT:  Mouth/Throat: No oropharyngeal exudate.  Eyes: EOM are normal. Pupils are equal, round, and reactive to light.  Neck: Neck supple.  Cardiovascular: Normal rate and normal heart sounds.   Pulmonary/Chest: Effort normal and breath sounds normal.  Abdominal: Soft. Bowel sounds are normal. She exhibits no distension. There is no tenderness.  Musculoskeletal:       Legs: Lymphadenopathy:    She has no cervical adenopathy.    Neurological:  Mental status- year 04, 14. President ?. Person/place normal.           Assessment & Plan:

## 2013-07-14 NOTE — Assessment & Plan Note (Signed)
Rate fairly well controlled today, will f/u with PCP and SNF.

## 2013-07-14 NOTE — Addendum Note (Signed)
Addended by: Laurell JosephsKING, TAMMY K on: 07/14/2013 10:05 AM   Modules accepted: Orders, Medications

## 2013-07-14 NOTE — Assessment & Plan Note (Signed)
Will f/u with SNF and PCP.

## 2013-07-14 NOTE — Progress Notes (Signed)
Per Dr Ninetta LightsHatcher  40 cm  Single lumen Peripherally Inserted Central Catheter  removed from right basilic . No sutures present. Dressing was clean and dry . Area cleansed with chlorhexidine and petroleum dressing applied. Pt advised no heavy lifting with this arm, leave dressing for 24 hours and call the office if dressing becomes soaked with blood or sharp pain presents.  Pt tolerated procedure well.    Laurell Josephsammy K Berlyn Saylor, RN

## 2013-07-18 NOTE — H&P (Signed)
[facility]                                  [place], [place] [address]                                     [phone]                          HISTORY AND PHYSICAL    PATIENT NAME: Bridget Hall, Bridget Hall [facility] GNFAOZ:[id number]  DATE OF SERVICE:05/31/2013  DATE OF BIRTH: Aug 14, 1924    A handwritten written history and physical was written June 01, 2013, when she came in because it was felt that she might be going to surgery that day and this is a dictated history and physical as required.    CHIEF COMPLAINT:  Pain in right leg.    HISTORY OF PRESENT ILLNESS:  The patient is an 80 year old lady who was brought to the Emergency Department last night.  She was traveling with her son from [place] to [place] to see her other son at Thanksgiving when she fell in her hotel room.  She was unable to walk and was brought to the Emergency Department where she was found to have a fracture of the right distal femur between the tip of a long femoral prosthesis and her total knee replacement.  She is admitted for medical evaluation and repair of the fracture.  She denies loss of consciousness or other injuries.    PAST MEDICAL HISTORY/PAST SURGICAL HISTORY:  She has she had hysterectomy, right total hip replacement, revision for a periarticular fracture and bilateral total knee replacements.    CURRENT MEDICATIONS:  Coumadin, which she takes for atrial fibrillation; Glucotrol, Tenormin and Travatan eyedrops.     ALLERGIES:  She has no known drug allergies.    FAMILY HISTORY:  Noncontributory.    SOCIAL HISTORY:  She lives with her son who was traveling with her in [place]; she does not smoke or use alcohol.    REVIEW OF SYSTEMS:  Negative for all systems.    PHYSICAL EXAMINATION:  She is a cheerful, well-developed lady complaining only of pain in her right leg, but she seems comfortable at rest.    HEENT:  Head:  Normocephalic  without evidence of trauma.  Hearing grossly intact.  Eyes:  Pupils equal, round and reactive to light and extraocular muscles intact.  Nose, mouth and throat clear.    NECK:  Without masses or bruits.    CHEST:  Clear, normal S1.    CARDIOVASCULAR:  Normal S1 and S2, without murmurs, or gallops.  Regular rhythm.    ABDOMEN:  Active bowel sounds, without palpable masses, tenderness or organomegaly.    NEUROLOGICAL:  Alert and oriented.  Cranial nerves intact.  No gross motor or sensory deficits noted.    MUSCULOSKELETAL:  All of her surgical wounds looked good and there is swelling; false motion over the right distal femur, but she can wiggle her toes and has good peripheral pulses.    IMPRESSION:  Periprosthetic fracture of the right distal femur.  PLAN:  I discussed the situation with her son and going back home or going [place] not possible at this time, so she will be further evaluated and her fracture fixed as soon as it is safe.  Her prothrombin time is 36.8 and with an INR of 3.36, anesthesia feels that this should be much lower before surgery is done so we will control her pain and steps will be taken to decrease her INR and surgery will be performed when it is safer.      Adria DillJohn Tongela Encinas, MD      [id number]; D: 07/18/2013 09:57:53; T: 07/18/2013 10:17:53

## 2013-07-21 NOTE — Progress Notes (Signed)
Patient ID: Tammy Sharp, female   DOB: 01-Aug-1924, 80 y.o.   MRN: [id number]     [facility]  No Known Allergies   Chief Complaint  Patient presents with  . Acute Visit    coumadin management     HPI:  She is on long term coumadin therapy for her afib. Her inr today is 1.9 and she is taking coumadin 6 mg daily. She is requiring frequent monitoring of her inr due to her abt use. There are no concerns being voiced by the nursing staff and she is tolerating her coumadin therapy without difficulty  Past Medical History  Diagnosis Date  . Hypertension   . Diabetes mellitus without complication   . Stroke   . A-fib     Past Surgical History  Procedure Laterality Date  . Joint replacement    . Fracture surgery    . Orif femur decompression Right 2014    R mid femur fracture, between R hip and kneee prosthesis  . Orif femur fracture Right 06/17/2013    Procedure: OPEN REDUCTION INTERNAL FIXATION (ORIF) DISTAL FEMUR FRACTURE;  Surgeon: Sheral Apleyimothy D Murphy, MD;  Location: [facility] OR;  Service: Orthopedics;  Laterality: Right;    VITAL SIGNS BP 128/79  Pulse 80  Ht 5\' 7"  (1.702 m)  Wt 200 lb (90.719 kg)  BMI 31.32 kg/m2   Patient's Medications  New Prescriptions   No medications on file  Previous Medications   ACETAMINOPHEN (TYLENOL) 325 MG TABLET    Take 650 mg by mouth every 6 (six) hours as needed for mild pain.   AMOXICILLIN (AMOXIL) 500 MG CAPSULE       ATENOLOL (TENORMIN) 50 MG TABLET    Take 50 mg by mouth daily.   CIPROFLOXACIN (CIPRO) 250 MG TABLET       DEXTROSE 5 % SOLN 50 ML WITH CEFEPIME 1 G SOLR 1 G    Inject 1 g into the vein every 12 (twelve) hours.   DOCUSATE SODIUM (COLACE) 100 MG CAPSULE    Take 100 mg by mouth 2 (two) times daily.   GLIPIZIDE (GLUCOTROL XL) 5 MG 24 HR TABLET       HYDROCODONE-ACETAMINOPHEN (NORCO/VICODIN) 5-325 MG PER TABLET    Take one tablet by mouth twice daily at 8am and 8pm for post surgery pain. Hold for sedation   INSULIN DETEMIR  (LEVEMIR) 100 UNIT/ML INJECTION    Inject 15 Units into the skin 2 (two) times daily.   INSULIN GLULISINE (APIDRA) 100 UNIT/ML INJECTION    Inject 5 Units into the skin 3 (three) times daily before meals.   INSULIN LISPRO (HUMALOG) 100 UNIT/ML INJECTION    Inject 5 Units into the skin 3 (three) times daily before meals.   IRON POLYSACCHARIDES (NIFEREX) 150 MG CAPSULE    Take 150 mg by mouth daily.   LATANOPROST (XALATAN) 0.005 % OPHTHALMIC SOLUTION    Place 1 drop into both eyes at bedtime.   METHOCARBAMOL (ROBAXIN) 500 MG TABLET    Take 500 mg by mouth 3 (three) times daily as needed for muscle spasms.   NITROGLYCERIN (NITROSTAT) 0.4 MG SL TABLET    Place 0.4 mg under the tongue every 5 (five) minutes as needed for chest pain.   OMEPRAZOLE (PRILOSEC) 20 MG CAPSULE    Take 20 mg by mouth daily.   ONDANSETRON (ZOFRAN) 4 MG/2ML SOLN INJECTION    Take 4 mg by mouth every 8 (eight) hours as needed for nausea or vomiting.  POLYETHYLENE GLYCOL (MIRALAX / GLYCOLAX) PACKET    Take 17 g by mouth daily as needed for moderate constipation or severe constipation.   TRAVATAN Z 0.004 % SOLN OPHTHALMIC SOLUTION       VANCOMYCIN (VANCOCIN) 1 GM/200ML SOLN    Inject 200 mLs (1,000 mg total) into the vein every 12 (twelve) hours.   WARFARIN (COUMADIN) 5 MG TABLET    Take 5 mg by mouth at bedtime.  Modified Medications   No medications on file  Discontinued Medications   No medications on file    SIGNIFICANT DIAGNOSTIC EXAMS   06-16-13: right knee x-ray: Lateral displacement of femoral fracture fixation hardware indicating hardware failure, with protrusion of a surgical screw through the skin. Comminuted distal femoral fracture with 1 full shaft width overlap of the fracture fragments.  06-16-13: right femur x-ray: Prior right hip and knee replacements. Prior ORIF of a distal femoral diaphyseal fracture with displacement of the dominant distal femoral fragment from the plate. Inferior most screw extends has  withdrawn from the plate and extends external to distal femoral soft tissues.  06-16-13: 2-d echo: Left ventricle: The cavity size was normal. Wall thickness was normal. Systolic function was normal. The estimated ejection fraction was in the range of 55% to 60%. Wall motion was normal; there were no regional wall motion abnormalities. - Mitral valve: Mild regurgitation. - Right atrium: The atrium was mildly dilated.   06-16-13: chest x-ray: No active disease.     LABS REVIEWED:   06-14-13: wbc 12.-; hgb 10.8; hct 35.7; mcv 102.9; plt 585; glucose 145; bun 16; creat 0.7; k+3.8; na++141 liver normal albumin 3.1  06-16-13: hgb a1c 5.5 06-18-13: wbc 9.2; hgb 9.8; hct 29.9; mcv 94.3; plt 387; glucose 157; bun 11; creat 0.53; k+4.4; na++136 06-25-13: wbc 5.6; hgb 9.1; hct 32.4 ;mcv 100.3; plt 412; glucose 129; bun 12; creat 0.5; k+3.6; na++144; liver normal albumin 2.8; mag 1.8  INR 1.5     Review of Systems  Constitutional: Negative for malaise/fatigue.  Respiratory: Negative for cough and shortness of breath.   Cardiovascular: Negative for chest pain and palpitations.  Gastrointestinal: Negative for heartburn and constipation.  Musculoskeletal: Negative for joint pain and myalgias.  Skin: Negative.   Neurological: Negative for weakness and headaches.  Psychiatric/Behavioral: Negative for depression. The patient is not nervous/anxious.       Physical Exam  Constitutional: She is oriented to person, place, and time. She appears well-developed and well-nourished. No distress.  overweight  Neck: Neck supple. No JVD present.  Cardiovascular: Normal rate, regular rhythm and intact distal pulses.   Respiratory: Effort normal and breath sounds normal. No respiratory distress. She has no wheezes.  GI: Soft. Bowel sounds are normal. She exhibits no distension. There is no tenderness.  Musculoskeletal: She exhibits no edema.  Right leg immobilizer in place   Neurological: She is alert and  oriented to person, place, and time.  Skin: Skin is warm and dry. She is not diaphoretic.  picc line in place  Psychiatric: She has a normal mood and affect.     ASSESSMENT/ PLAN:  1. Afib/ anticoagulation therapy: for inr 1.9  on 6 mg will continue 6 mg coumadin daily will check inr in 2 days will monitor

## 2013-07-21 NOTE — Progress Notes (Signed)
Patient ID: Tammy Sharp, female   DOB: 17-Jul-1924, 80 y.o.   MRN: [id number]     [facility]  No Known Allergies   Chief Complaint  Patient presents with  . Acute Visit    coumadin management     HPI:  She is being seen for the management of her inr. She is taking coumadin for afib. She is tolerating coumadin therapy without difficulty. There are no concerns being voiced at this time.   Past Medical History  Diagnosis Date  . Hypertension   . Diabetes mellitus without complication   . Stroke   . A-fib     Past Surgical History  Procedure Laterality Date  . Joint replacement    . Fracture surgery    . Orif femur decompression Right 2014    R mid femur fracture, between R hip and kneee prosthesis  . Orif femur fracture Right 06/17/2013    Procedure: OPEN REDUCTION INTERNAL FIXATION (ORIF) DISTAL FEMUR FRACTURE;  Surgeon: Sheral Apleyimothy D Murphy, MD;  Location: [facility] OR;  Service: Orthopedics;  Laterality: Right;    VITAL SIGNS BP 127/76  Pulse 78  Ht 5\' 7"  (1.702 m)  Wt 200 lb (90.719 kg)  BMI 31.32 kg/m2   Patient's Medications  New Prescriptions   No medications on file  Previous Medications   ACETAMINOPHEN (TYLENOL) 325 MG TABLET    Take 650 mg by mouth every 6 (six) hours as needed for mild pain.   AMOXICILLIN (AMOXIL) 500 MG CAPSULE       ATENOLOL (TENORMIN) 50 MG TABLET    Take 50 mg by mouth daily.   CIPROFLOXACIN (CIPRO) 250 MG TABLET       DEXTROSE 5 % SOLN 50 ML WITH CEFEPIME 1 G SOLR 1 G    Inject 1 g into the vein every 12 (twelve) hours.   DOCUSATE SODIUM (COLACE) 100 MG CAPSULE    Take 100 mg by mouth 2 (two) times daily.   GLIPIZIDE (GLUCOTROL XL) 5 MG 24 HR TABLET       HYDROCODONE-ACETAMINOPHEN (NORCO/VICODIN) 5-325 MG PER TABLET    Take one tablet by mouth twice daily at 8am and 8pm for post surgery pain. Hold for sedation   INSULIN DETEMIR (LEVEMIR) 100 UNIT/ML INJECTION    Inject 15 Units into the skin 2 (two) times daily.   INSULIN GLULISINE  (APIDRA) 100 UNIT/ML INJECTION    Inject 5 Units into the skin 3 (three) times daily before meals.   INSULIN LISPRO (HUMALOG) 100 UNIT/ML INJECTION    Inject 5 Units into the skin 3 (three) times daily before meals.   IRON POLYSACCHARIDES (NIFEREX) 150 MG CAPSULE    Take 150 mg by mouth daily.   LATANOPROST (XALATAN) 0.005 % OPHTHALMIC SOLUTION    Place 1 drop into both eyes at bedtime.   METHOCARBAMOL (ROBAXIN) 500 MG TABLET    Take 500 mg by mouth 3 (three) times daily as needed for muscle spasms.   NITROGLYCERIN (NITROSTAT) 0.4 MG SL TABLET    Place 0.4 mg under the tongue every 5 (five) minutes as needed for chest pain.   OMEPRAZOLE (PRILOSEC) 20 MG CAPSULE    Take 20 mg by mouth daily.   ONDANSETRON (ZOFRAN) 4 MG/2ML SOLN INJECTION    Take 4 mg by mouth every 8 (eight) hours as needed for nausea or vomiting.    POLYETHYLENE GLYCOL (MIRALAX / GLYCOLAX) PACKET    Take 17 g by mouth daily as needed for moderate constipation or  severe constipation.   TRAVATAN Z 0.004 % SOLN OPHTHALMIC SOLUTION       VANCOMYCIN (VANCOCIN) 1 GM/200ML SOLN    Inject 200 mLs (1,000 mg total) into the vein every 12 (twelve) hours.   WARFARIN (COUMADIN) 5 MG TABLET    Take 5 mg by mouth at bedtime.  Modified Medications   No medications on file  Discontinued Medications   No medications on file    SIGNIFICANT DIAGNOSTIC EXAMS   06-16-13: right knee x-ray: Lateral displacement of femoral fracture fixation hardware indicating hardware failure, with protrusion of a surgical screw through the skin. Comminuted distal femoral fracture with 1 full shaft width overlap of the fracture fragments.  06-16-13: right femur x-ray: Prior right hip and knee replacements. Prior ORIF of a distal femoral diaphyseal fracture with displacement of the dominant distal femoral fragment from the plate. Inferior most screw extends has withdrawn from the plate and extends external to distal femoral soft tissues.  06-16-13: 2-d echo: Left  ventricle: The cavity size was normal. Wall thickness was normal. Systolic function was normal. The estimated ejection fraction was in the range of 55% to 60%. Wall motion was normal; there were no regional wall motion abnormalities. - Mitral valve: Mild regurgitation. - Right atrium: The atrium was mildly dilated.   06-16-13: chest x-ray: No active disease.     LABS REVIEWED:   06-14-13: wbc 12.-; hgb 10.8; hct 35.7; mcv 102.9; plt 585; glucose 145; bun 16; creat 0.7; k+3.8; na++141 liver normal albumin 3.1  06-16-13: hgb a1c 5.5 06-18-13: wbc 9.2; hgb 9.8; hct 29.9; mcv 94.3; plt 387; glucose 157; bun 11; creat 0.53; k+4.4; na++136 06-25-13: wbc 5.6; hgb 9.1; hct 32.4 ;mcv 100.3; plt 412; glucose 129; bun 12; creat 0.5; k+3.6; na++144; liver normal albumin 2.8; mag 1.8  INR 1.5     Review of Systems  Constitutional: Negative for malaise/fatigue.  Respiratory: Negative for cough and shortness of breath.   Cardiovascular: Negative for chest pain and palpitations.  Gastrointestinal: Negative for heartburn and constipation.  Musculoskeletal: Negative for joint pain and myalgias.  Skin: Negative.   Neurological: Negative for weakness and headaches.  Psychiatric/Behavioral: Negative for depression. The patient is not nervous/anxious.       Physical Exam  Constitutional: She is oriented to person, place, and time. She appears well-developed and well-nourished. No distress.  overweight  Neck: Neck supple. No JVD present.  Cardiovascular: Normal rate, regular rhythm and intact distal pulses.   Respiratory: Effort normal and breath sounds normal. No respiratory distress. She has no wheezes.  GI: Soft. Bowel sounds are normal. She exhibits no distension. There is no tenderness.  Musculoskeletal: She exhibits no edema.  Right leg immobilizer in place   Neurological: She is alert and oriented to person, place, and time.  Skin: Skin is warm and dry. She is not diaphoretic.  picc line in  place  Psychiatric: She has a normal mood and affect.     ASSESSMENT/ PLAN:  1. Afib/ anticoagulation therapy: for inr 1.5 on 6 mg will begin 7 mg daily will check inr on Monday will monitor her status.

## 2013-07-22 ENCOUNTER — Non-Acute Institutional Stay (SKILLED_NURSING_FACILITY): Payer: Medicare Other | Admitting: Adult Health

## 2013-07-22 ENCOUNTER — Encounter: Payer: Self-pay | Admitting: Adult Health

## 2013-07-22 DIAGNOSIS — S72301A Unspecified fracture of shaft of right femur, initial encounter for closed fracture: Secondary | ICD-10-CM

## 2013-07-22 DIAGNOSIS — I10 Essential (primary) hypertension: Secondary | ICD-10-CM

## 2013-07-22 DIAGNOSIS — Z7901 Long term (current) use of anticoagulants: Secondary | ICD-10-CM

## 2013-07-22 DIAGNOSIS — S72309A Unspecified fracture of shaft of unspecified femur, initial encounter for closed fracture: Secondary | ICD-10-CM

## 2013-07-22 DIAGNOSIS — IMO0001 Reserved for inherently not codable concepts without codable children: Secondary | ICD-10-CM | POA: Insufficient documentation

## 2013-07-22 DIAGNOSIS — D62 Acute posthemorrhagic anemia: Secondary | ICD-10-CM

## 2013-07-22 DIAGNOSIS — T8140XA Infection following a procedure, unspecified, initial encounter: Secondary | ICD-10-CM

## 2013-07-22 DIAGNOSIS — T8450XA Infection and inflammatory reaction due to unspecified internal joint prosthesis, initial encounter: Secondary | ICD-10-CM

## 2013-07-22 DIAGNOSIS — I4891 Unspecified atrial fibrillation: Secondary | ICD-10-CM

## 2013-07-22 DIAGNOSIS — K219 Gastro-esophageal reflux disease without esophagitis: Secondary | ICD-10-CM

## 2013-07-22 DIAGNOSIS — E1165 Type 2 diabetes mellitus with hyperglycemia: Secondary | ICD-10-CM

## 2013-07-22 NOTE — Progress Notes (Signed)
Patient ID: Tammy AlconMary Lois Graley, female   DOB: August 30, 1924, 80 y.o.   MRN: [id number]     [facility]  No Known Allergies   Chief Complaint  Patient presents with  . Medical Managment of Chronic Issues    HPI:  She is being seen for the management of her chronic illnesses. Overall she is doing well her abt has been completed. Her inr yesterday was 6.9; she was given vit k 2.5 mg and her coumadin was held. Her inr today is 2.8. She is not voicing any complaints of pain. She states that she feels as though she should be able to go home in the near future.    Past Medical History  Diagnosis Date  . Hypertension   . Diabetes mellitus without complication   . Stroke   . A-fib     Past Surgical History  Procedure Laterality Date  . Joint replacement    . Fracture surgery    . Orif femur decompression Right 2014    R mid femur fracture, between R hip and kneee prosthesis  . Orif femur fracture Right 06/17/2013    Procedure: OPEN REDUCTION INTERNAL FIXATION (ORIF) DISTAL FEMUR FRACTURE;  Surgeon: Sheral Apleyimothy D Murphy, MD;  Location: [facility] OR;  Service: Orthopedics;  Laterality: Right;    VITAL SIGNS BP 138/50  Pulse 70  Ht 5\' 7"  (1.702 m)  Wt 183 lb (83.008 kg)  BMI 28.66 kg/m2   Patient's Medications  New Prescriptions   No medications on file  Previous Medications   ACETAMINOPHEN (TYLENOL) 325 MG TABLET    Take 650 mg by mouth every 6 (six) hours as needed for mild pain.   ATENOLOL (TENORMIN) 50 MG TABLET    Take 50 mg by mouth daily.   DOCUSATE SODIUM (COLACE) 100 MG CAPSULE    Take 100 mg by mouth 2 (two) times daily.   INSULIN DETEMIR (LEVEMIR) 100 UNIT/ML INJECTION    Inject 15 Units into the skin 2 (two) times daily.   INSULIN LISPRO (HUMALOG) 100 UNIT/ML INJECTION    Inject 5 Units into the skin 3 (three) times daily before meals.   IRON POLYSACCHARIDES (NIFEREX) 150 MG CAPSULE    Take 150 mg by mouth daily.   LATANOPROST (XALATAN) 0.005 % OPHTHALMIC SOLUTION    Place 1  drop into both eyes at bedtime.   MAGNESIUM OXIDE (MAG-OX) 400 MG TABLET    Take 400 mg by mouth daily.   METHOCARBAMOL (ROBAXIN) 500 MG TABLET    Take 500 mg by mouth 3 (three) times daily as needed for muscle spasms.   NITROGLYCERIN (NITROSTAT) 0.4 MG SL TABLET    Place 0.4 mg under the tongue every 5 (five) minutes as needed for chest pain.   OMEPRAZOLE (PRILOSEC) 20 MG CAPSULE    Take 20 mg by mouth daily.   ONDANSETRON (ZOFRAN) 4 MG TABLET    Take 4 mg by mouth every 8 (eight) hours as needed for nausea or vomiting.   POLYETHYLENE GLYCOL (MIRALAX / GLYCOLAX) PACKET    Take 17 g by mouth daily as needed for moderate constipation or severe constipation.   WARFARIN (COUMADIN) 5 MG TABLET    Take 2.5 mg by mouth at bedtime.   Modified Medications   Modified Medication Previous Medication   HYDROCODONE-ACETAMINOPHEN (NORCO/VICODIN) 5-325 MG PER TABLET HYDROcodone-acetaminophen (NORCO/VICODIN) 5-325 MG per tablet      Take 1-2 tablets by mouth every 4 (four) hours as needed.    Take one tablet by  mouth twice daily at 8am and 8pm for post surgery pain. Hold for sedation  Discontinued Medications   AMOXICILLIN (AMOXIL) 500 MG CAPSULE       CIPROFLOXACIN (CIPRO) 250 MG TABLET       DEXTROSE 5 % SOLN 50 ML WITH CEFEPIME 1 G SOLR 1 G    Inject 1 g into the vein every 12 (twelve) hours.   GLIPIZIDE (GLUCOTROL XL) 5 MG 24 HR TABLET       INSULIN GLULISINE (APIDRA) 100 UNIT/ML INJECTION    Inject 5 Units into the skin 3 (three) times daily before meals.   ONDANSETRON (ZOFRAN) 4 MG/2ML SOLN INJECTION    Take 4 mg by mouth every 8 (eight) hours as needed for nausea or vomiting.    TRAVATAN Z 0.004 % SOLN OPHTHALMIC SOLUTION       VANCOMYCIN (VANCOCIN) 1 GM/200ML SOLN    Inject 200 mLs (1,000 mg total) into the vein every 12 (twelve) hours.    SIGNIFICANT DIAGNOSTIC EXAMS   06-16-13: right knee x-ray: Lateral displacement of femoral fracture fixation hardware indicating hardware failure, with  protrusion of a surgical screw through the skin. Comminuted distal femoral fracture with 1 full shaft width overlap of the fracture fragments.  06-16-13: right femur x-ray: Prior right hip and knee replacements. Prior ORIF of a distal femoral diaphyseal fracture with displacement of the dominant distal femoral fragment from the plate. Inferior most screw extends has withdrawn from the plate and extends external to distal femoral soft tissues.  06-16-13: 2-d echo: Left ventricle: The cavity size was normal. Wall thickness was normal. Systolic function was normal. The estimated ejection fraction was in the range of 55% to 60%. Wall motion was normal; there were no regional wall motion abnormalities. - Mitral valve: Mild regurgitation. - Right atrium: The atrium was mildly dilated.   06-16-13: chest x-ray: No active disease.     LABS REVIEWED:   06-14-13: wbc 12.-; hgb 10.8; hct 35.7; mcv 102.9; plt 585; glucose 145; bun 16; creat 0.7; k+3.8; na++141 liver normal albumin 3.1  06-16-13: hgb a1c 5.5 06-18-13: wbc 9.2; hgb 9.8; hct 29.9; mcv 94.3; plt 387; glucose 157; bun 11; creat 0.53; k+4.4; na++136 06-25-13: wbc 5.6; hgb 9.1; hct 32.4 ;mcv 100.3; plt 412; glucose 129; bun 12; creat 0.5; k+3.6; na++144; liver normal albumin 2.8; mag 1.8  INR 1.5  07-12-13: mag 1.9      Review of Systems  Constitutional: Negative for malaise/fatigue.  Eyes: Negative for blurred vision.  Respiratory: Negative for cough and shortness of breath.   Cardiovascular: Negative for chest pain, palpitations and leg swelling.  Gastrointestinal: Negative for heartburn, abdominal pain and constipation.  Musculoskeletal: Negative for joint pain and myalgias.  Skin: Negative.   Neurological: Negative for dizziness, weakness and headaches.  Psychiatric/Behavioral: Negative for depression. The patient is not nervous/anxious.      Physical Exam  Constitutional: She is oriented to person, place, and time. She appears  well-developed and well-nourished. No distress.  Neck: Neck supple. No JVD present.  Cardiovascular: Normal rate, regular rhythm and intact distal pulses.   Respiratory: Effort normal and breath sounds normal. No respiratory distress. She has no wheezes.  GI: Soft. Bowel sounds are normal. She exhibits no distension. There is no tenderness.  Musculoskeletal: She exhibits no edema.  Is able to move all extremities; right leg with splint in place   Neurological: She is alert and oriented to person, place, and time.  Skin: Skin is warm and dry.  She is not diaphoretic.  Incision line sutures intact without signs of infection present. Leg splint in place right leg     ASSESSMENT/ PLAN:  1. Afib/ anticoagulation management:  Her heart rate is stable will start her coumadin at 1.5 mg daily and will check inr on 07-26-13 and will monitor her status.   2. Post op infection and right femur fracture: she has completed her abt; will continue to monitor her status will not make changes. will continue robaxin 500 mg three times daily as needed; vicodin 5/325 mg 1-2 tabs every 4 hours as needed   3. Hypertension; is stable will continue tenormin 50 mg daily and will monitor   4. Anemia: will continue ferrex 150 mg daily  5. Gerd: will continue prilosec 20 mg daily  6. Hypomagnesemia: will continue mag ox 400 mg daily  7. Diabetes: she is stable will continue levemir 15 units twice daily; and humalog 5 units prior to meals and will monitor   8. Glaucoma: will continue xalatan to both eyes nightly   9. Constipation: will continue colace twice daily and miralax daily as needed    Time spent with patient 45 minutes

## 2013-07-25 NOTE — Progress Notes (Signed)
Patient ID: Tammy Sharp, female   DOB: [birth year], [age] y.o.   MRN: [id number]     [facility]  No Known Allergies   Chief Complaint  Patient presents with  . Acute Visit    coumadin management     HPI:  Her inr today is 2.3 and she is taking coumadin 7 mg daily. She is tolerating her coumadin therapy without difficulty there are no concerns being voiced by the nursing staff at this time. She is not voicing any concerns.   Past Medical History  Diagnosis Date  . Hypertension   . Diabetes mellitus without complication   . Stroke   . A-fib     Past Surgical History  Procedure Laterality Date  . Joint replacement    . Fracture surgery    . Orif femur decompression Right 2014    R mid femur fracture, between R hip and kneee prosthesis  . Orif femur fracture Right 06/17/2013    Procedure: OPEN REDUCTION INTERNAL FIXATION (ORIF) DISTAL FEMUR FRACTURE;  Surgeon: Sheral Apleyimothy D Murphy, MD;  Location: [facility] OR;  Service: Orthopedics;  Laterality: Right;    VITAL SIGNS BP 118/70  Pulse 68  Ht 5\' 7"  (1.702 m)  Wt 200 lb (90.719 kg)  BMI 31.32 kg/m2   Patient's Medications  New Prescriptions   No medications on file  Previous Medications   ACETAMINOPHEN (TYLENOL) 325 MG TABLET    Take 650 mg by mouth every 6 (six) hours as needed for mild pain.   ATENOLOL (TENORMIN) 50 MG TABLET    Take 50 mg by mouth daily.   DOCUSATE SODIUM (COLACE) 100 MG CAPSULE    Take 100 mg by mouth 2 (two) times daily.   HYDROCODONE-ACETAMINOPHEN (NORCO/VICODIN) 5-325 MG PER TABLET    Take 1-2 tablets by mouth every 4 (four) hours as needed.   INSULIN DETEMIR (LEVEMIR) 100 UNIT/ML INJECTION    Inject 15 Units into the skin 2 (two) times daily.   INSULIN LISPRO (HUMALOG) 100 UNIT/ML INJECTION    Inject 5 Units into the skin 3 (three) times daily before meals.   IRON POLYSACCHARIDES (NIFEREX) 150 MG CAPSULE    Take 150 mg by mouth daily.   LATANOPROST (XALATAN) 0.005 % OPHTHALMIC SOLUTION    Place 1 drop  into both eyes at bedtime.   MAGNESIUM OXIDE (MAG-OX) 400 MG TABLET    Take 400 mg by mouth daily.   METHOCARBAMOL (ROBAXIN) 500 MG TABLET    Take 500 mg by mouth 3 (three) times daily as needed for muscle spasms.   NITROGLYCERIN (NITROSTAT) 0.4 MG SL TABLET    Place 0.4 mg under the tongue every 5 (five) minutes as needed for chest pain.   OMEPRAZOLE (PRILOSEC) 20 MG CAPSULE    Take 20 mg by mouth daily.   ONDANSETRON (ZOFRAN) 4 MG TABLET    Take 4 mg by mouth every 8 (eight) hours as needed for nausea or vomiting.   POLYETHYLENE GLYCOL (MIRALAX / GLYCOLAX) PACKET    Take 17 g by mouth daily as needed for moderate constipation or severe constipation.   WARFARIN (COUMADIN) 5 MG TABLET    Take 2.5 mg by mouth at bedtime.   Modified Medications   No medications on file  Discontinued Medications   No medications on file    SIGNIFICANT DIAGNOSTIC EXAMS  06-16-13: right knee x-ray: Lateral displacement of femoral fracture fixation hardware indicating hardware failure, with protrusion of a surgical screw through the skin. Comminuted distal femoral  fracture with 1 full shaft width overlap of the fracture fragments.  06-16-13: right femur x-ray: Prior right hip and knee replacements. Prior ORIF of a distal femoral diaphyseal fracture with displacement of the dominant distal femoral fragment from the plate. Inferior most screw extends has withdrawn from the plate and extends external to distal femoral soft tissues.  06-16-13: 2-d echo: Left ventricle: The cavity size was normal. Wall thickness was normal. Systolic function was normal. The estimated ejection fraction was in the range of 55% to 60%. Wall motion was normal; there were no regional wall motion abnormalities. - Mitral valve: Mild regurgitation. - Right atrium: The atrium was mildly dilated.   06-16-13: chest x-ray: No active disease.     LABS REVIEWED:   06-14-13: wbc 12.-; hgb 10.8; hct 35.7; mcv 102.9; plt 585; glucose 145; bun 16;  creat 0.7; k+3.8; na++141 liver normal albumin 3.1  06-16-13: hgb a1c 5.5 06-18-13: wbc 9.2; hgb 9.8; hct 29.9; mcv 94.3; plt 387; glucose 157; bun 11; creat 0.53; k+4.4; na++136 06-25-13: wbc 5.6; hgb 9.1; hct 32.4 ;mcv 100.3; plt 412; glucose 129; bun 12; creat 0.5; k+3.6; na++144; liver normal albumin 2.8; mag 1.8  INR 1.5     Review of Systems  Constitutional: Negative for malaise/fatigue.  Respiratory: Negative for cough and shortness of breath.   Cardiovascular: Negative for chest pain and palpitations.  Gastrointestinal: Negative for heartburn and constipation.  Musculoskeletal: Negative for joint pain and myalgias.  Skin: Negative.   Neurological: Negative for weakness and headaches.  Psychiatric/Behavioral: Negative for depression. The patient is not nervous/anxious.       Physical Exam  Constitutional: She is oriented to person, place, and time. She appears well-developed and well-nourished. No distress.  overweight  Neck: Neck supple. No JVD present.  Cardiovascular: Normal rate, regular rhythm and intact distal pulses.   Respiratory: Effort normal and breath sounds normal. No respiratory distress. She has no wheezes.  GI: Soft. Bowel sounds are normal. She exhibits no distension. There is no tenderness.  Musculoskeletal: She exhibits no edema.  Right leg immobilizer in place   Neurological: She is alert and oriented to person, place, and time.  Skin: Skin is warm and dry. She is not diaphoretic.  picc line in place  Psychiatric: She has a normal mood and affect.     ASSESSMENT/ PLAN:  1. Afib/ anticoagulation therapy: for inr 2.3 will continue 7 mg coumadin daily will check inr in one week and will monitor

## 2013-07-25 NOTE — Progress Notes (Signed)
Patient ID: Tammy Sharp, female   DOB: 1924-10-01, 80 y.o.   MRN: [id number]     [facility]  No Known Allergies   Chief Complaint  Patient presents with  . Acute Visit    coumadin management     HPI:  She is being for her coumadin management. She is on iv abt. Her inr is 1.9 and she is taking coumadin 6 mg daily she is tolerating coumadin therapy without difficulty. There are no concerns being voiced by the nursing staff.    Past Medical History  Diagnosis Date  . Hypertension   . Diabetes mellitus without complication   . Stroke   . A-fib     Past Surgical History  Procedure Laterality Date  . Joint replacement    . Fracture surgery    . Orif femur decompression Right 2014    R mid femur fracture, between R hip and kneee prosthesis  . Orif femur fracture Right 06/17/2013    Procedure: OPEN REDUCTION INTERNAL FIXATION (ORIF) DISTAL FEMUR FRACTURE;  Surgeon: Sheral Apleyimothy D Murphy, MD;  Location: [facility] OR;  Service: Orthopedics;  Laterality: Right;    VITAL SIGNS BP 127/79  Pulse 80  Ht 5\' 7"  (1.702 m)  Wt 200 lb (90.719 kg)  BMI 31.32 kg/m2   Patient's Medications  New Prescriptions   No medications on file  Previous Medications   ACETAMINOPHEN (TYLENOL) 325 MG TABLET    Take 650 mg by mouth every 6 (six) hours as needed for mild pain.   ATENOLOL (TENORMIN) 50 MG TABLET    Take 50 mg by mouth daily.   DOCUSATE SODIUM (COLACE) 100 MG CAPSULE    Take 100 mg by mouth 2 (two) times daily.   HYDROCODONE-ACETAMINOPHEN (NORCO/VICODIN) 5-325 MG PER TABLET    Take 1-2 tablets by mouth every 4 (four) hours as needed.   INSULIN DETEMIR (LEVEMIR) 100 UNIT/ML INJECTION    Inject 15 Units into the skin 2 (two) times daily.   INSULIN LISPRO (HUMALOG) 100 UNIT/ML INJECTION    Inject 5 Units into the skin 3 (three) times daily before meals.   IRON POLYSACCHARIDES (NIFEREX) 150 MG CAPSULE    Take 150 mg by mouth daily.   LATANOPROST (XALATAN) 0.005 % OPHTHALMIC SOLUTION    Place  1 drop into both eyes at bedtime.   MAGNESIUM OXIDE (MAG-OX) 400 MG TABLET    Take 400 mg by mouth daily.   METHOCARBAMOL (ROBAXIN) 500 MG TABLET    Take 500 mg by mouth 3 (three) times daily as needed for muscle spasms.   NITROGLYCERIN (NITROSTAT) 0.4 MG SL TABLET    Place 0.4 mg under the tongue every 5 (five) minutes as needed for chest pain.   OMEPRAZOLE (PRILOSEC) 20 MG CAPSULE    Take 20 mg by mouth daily.   ONDANSETRON (ZOFRAN) 4 MG TABLET    Take 4 mg by mouth every 8 (eight) hours as needed for nausea or vomiting.   POLYETHYLENE GLYCOL (MIRALAX / GLYCOLAX) PACKET    Take 17 g by mouth daily as needed for moderate constipation or severe constipation.   WARFARIN (COUMADIN) 5 MG TABLET    Take 2.5 mg by mouth at bedtime.   Modified Medications   No medications on file  Discontinued Medications   No medications on file    SIGNIFICANT DIAGNOSTIC EXAMS  06-16-13: right knee x-ray: Lateral displacement of femoral fracture fixation hardware indicating hardware failure, with protrusion of a surgical screw through the skin. Comminuted  distal femoral fracture with 1 full shaft width overlap of the fracture fragments.  06-16-13: right femur x-ray: Prior right hip and knee replacements. Prior ORIF of a distal femoral diaphyseal fracture with displacement of the dominant distal femoral fragment from the plate. Inferior most screw extends has withdrawn from the plate and extends external to distal femoral soft tissues.  06-16-13: 2-d echo: Left ventricle: The cavity size was normal. Wall thickness was normal. Systolic function was normal. The estimated ejection fraction was in the range of 55% to 60%. Wall motion was normal; there were no regional wall motion abnormalities. - Mitral valve: Mild regurgitation. - Right atrium: The atrium was mildly dilated.   06-16-13: chest x-ray: No active disease.     LABS REVIEWED:   06-14-13: wbc 12.-; hgb 10.8; hct 35.7; mcv 102.9; plt 585; glucose 145;  bun 16; creat 0.7; k+3.8; na++141 liver normal albumin 3.1  06-16-13: hgb a1c 5.5 06-18-13: wbc 9.2; hgb 9.8; hct 29.9; mcv 94.3; plt 387; glucose 157; bun 11; creat 0.53; k+4.4; na++136 06-25-13: wbc 5.6; hgb 9.1; hct 32.4 ;mcv 100.3; plt 412; glucose 129; bun 12; creat 0.5; k+3.6; na++144; liver normal albumin 2.8; mag 1.8  INR 1.5     Review of Systems  Constitutional: Negative for malaise/fatigue.  Respiratory: Negative for cough and shortness of breath.   Cardiovascular: Negative for chest pain and palpitations.  Gastrointestinal: Negative for heartburn and constipation.  Musculoskeletal: Negative for joint pain and myalgias.  Skin: Negative.   Neurological: Negative for weakness and headaches.  Psychiatric/Behavioral: Negative for depression. The patient is not nervous/anxious.       Physical Exam  Constitutional: She is oriented to person, place, and time. She appears well-developed and well-nourished. No distress.  overweight  Neck: Neck supple. No JVD present.  Cardiovascular: Normal rate, regular rhythm and intact distal pulses.   Respiratory: Effort normal and breath sounds normal. No respiratory distress. She has no wheezes.  GI: Soft. Bowel sounds are normal. She exhibits no distension. There is no tenderness.  Musculoskeletal: She exhibits no edema.  Right leg immobilizer in place   Neurological: She is alert and oriented to person, place, and time.  Skin: Skin is warm and dry. She is not diaphoretic.  picc line in place  Psychiatric: She has a normal mood and affect.     ASSESSMENT/ PLAN:  1. Afib/ anticoagulation therapy: for inr 1.9  on 6 mg will continue 6 mg coumadin daily will check inr in 2 days will monitor

## 2013-07-26 ENCOUNTER — Non-Acute Institutional Stay (SKILLED_NURSING_FACILITY): Payer: Medicare Other | Admitting: Adult Health

## 2013-07-26 DIAGNOSIS — I639 Cerebral infarction, unspecified: Secondary | ICD-10-CM

## 2013-07-26 DIAGNOSIS — Z7901 Long term (current) use of anticoagulants: Secondary | ICD-10-CM

## 2013-07-26 DIAGNOSIS — I4891 Unspecified atrial fibrillation: Secondary | ICD-10-CM

## 2013-07-26 DIAGNOSIS — I6350 Cerebral infarction due to unspecified occlusion or stenosis of unspecified cerebral artery: Secondary | ICD-10-CM

## 2013-07-28 ENCOUNTER — Non-Acute Institutional Stay (SKILLED_NURSING_FACILITY): Payer: Medicare Other | Admitting: Adult Health

## 2013-07-28 DIAGNOSIS — I639 Cerebral infarction, unspecified: Secondary | ICD-10-CM

## 2013-07-28 DIAGNOSIS — I6350 Cerebral infarction due to unspecified occlusion or stenosis of unspecified cerebral artery: Secondary | ICD-10-CM

## 2013-07-28 DIAGNOSIS — I4891 Unspecified atrial fibrillation: Secondary | ICD-10-CM

## 2013-07-28 DIAGNOSIS — Z7901 Long term (current) use of anticoagulants: Secondary | ICD-10-CM

## 2013-07-29 NOTE — Progress Notes (Signed)
Patient ID: Tammy Sharp, female   DOB: 1924/12/29, 80 y.o.   MRN: [id number]     [facility]  No Known Allergies   Chief Complaint  Patient presents with  . Acute Visit    coumadin management     HPI:  She is being seen for the management of her coumadin therapy. She is tolerating coumadin without difficulty. There are no concerns being voiced at this time. Her inr today is 1.2 and she is taking coumadin 1.5 mg daily . Her abt has been completed.  Past Medical History  Diagnosis Date  . Hypertension   . Diabetes mellitus without complication   . Stroke   . A-fib     Past Surgical History  Procedure Laterality Date  . Joint replacement    . Fracture surgery    . Orif femur decompression Right 2014    R mid femur fracture, between R hip and kneee prosthesis  . Orif femur fracture Right 06/17/2013    Procedure: OPEN REDUCTION INTERNAL FIXATION (ORIF) DISTAL FEMUR FRACTURE;  Surgeon: Sheral Apleyimothy D Murphy, MD;  Location: [facility] OR;  Service: Orthopedics;  Laterality: Right;    VITAL SIGNS BP 132/70  Pulse 87  Ht 5\' 7"  (1.702 m)  Wt 200 lb (90.719 kg)  BMI 31.32 kg/m2   Patient's Medications  New Prescriptions   No medications on file  Previous Medications   ACETAMINOPHEN (TYLENOL) 325 MG TABLET    Take 650 mg by mouth every 6 (six) hours as needed for mild pain.   ATENOLOL (TENORMIN) 50 MG TABLET    Take 50 mg by mouth daily.   DOCUSATE SODIUM (COLACE) 100 MG CAPSULE    Take 100 mg by mouth 2 (two) times daily.   HYDROCODONE-ACETAMINOPHEN (NORCO/VICODIN) 5-325 MG PER TABLET    Take 1-2 tablets by mouth every 4 (four) hours as needed.   INSULIN DETEMIR (LEVEMIR) 100 UNIT/ML INJECTION    Inject 15 Units into the skin 2 (two) times daily.   INSULIN LISPRO (HUMALOG) 100 UNIT/ML INJECTION    Inject 5 Units into the skin 3 (three) times daily before meals.   IRON POLYSACCHARIDES (NIFEREX) 150 MG CAPSULE    Take 150 mg by mouth daily.   LATANOPROST (XALATAN) 0.005 %  OPHTHALMIC SOLUTION    Place 1 drop into both eyes at bedtime.   MAGNESIUM OXIDE (MAG-OX) 400 MG TABLET    Take 400 mg by mouth daily.   METHOCARBAMOL (ROBAXIN) 500 MG TABLET    Take 500 mg by mouth 3 (three) times daily as needed for muscle spasms.   NITROGLYCERIN (NITROSTAT) 0.4 MG SL TABLET    Place 0.4 mg under the tongue every 5 (five) minutes as needed for chest pain.   OMEPRAZOLE (PRILOSEC) 20 MG CAPSULE    Take 20 mg by mouth daily.   ONDANSETRON (ZOFRAN) 4 MG TABLET    Take 4 mg by mouth every 8 (eight) hours as needed for nausea or vomiting.   POLYETHYLENE GLYCOL (MIRALAX / GLYCOLAX) PACKET    Take 17 g by mouth daily as needed for moderate constipation or severe constipation.   WARFARIN (COUMADIN) 5 MG TABLET    Take 1.5  mg by mouth at bedtime.   Modified Medications   No medications on file  Discontinued Medications   No medications on file    SIGNIFICANT DIAGNOSTIC EXAMS  06-16-13: right knee x-ray: Lateral displacement of femoral fracture fixation hardware indicating hardware failure, with protrusion of a surgical screw through  the skin. Comminuted distal femoral fracture with 1 full shaft width overlap of the fracture fragments.  06-16-13: right femur x-ray: Prior right hip and knee replacements. Prior ORIF of a distal femoral diaphyseal fracture with displacement of the dominant distal femoral fragment from the plate. Inferior most screw extends has withdrawn from the plate and extends external to distal femoral soft tissues.  06-16-13: 2-d echo: Left ventricle: The cavity size was normal. Wall thickness was normal. Systolic function was normal. The estimated ejection fraction was in the range of 55% to 60%. Wall motion was normal; there were no regional wall motion abnormalities. - Mitral valve: Mild regurgitation. - Right atrium: The atrium was mildly dilated.   06-16-13: chest x-ray: No active disease.     LABS REVIEWED:   06-14-13: wbc 12.-; hgb 10.8; hct 35.7; mcv  102.9; plt 585; glucose 145; bun 16; creat 0.7; k+3.8; na++141 liver normal albumin 3.1  06-16-13: hgb a1c 5.5 06-18-13: wbc 9.2; hgb 9.8; hct 29.9; mcv 94.3; plt 387; glucose 157; bun 11; creat 0.53; k+4.4; na++136 06-25-13: wbc 5.6; hgb 9.1; hct 32.4 ;mcv 100.3; plt 412; glucose 129; bun 12; creat 0.5; k+3.6; na++144; liver normal albumin 2.8; mag 1.8  INR 1.5  07-12-13: mag 1.9      Review of Systems  Constitutional: Negative for malaise/fatigue.  Eyes: Negative for blurred vision.  Respiratory: Negative for cough and shortness of breath.   Cardiovascular: Negative for chest pain, palpitations and leg swelling.  Gastrointestinal: Negative for heartburn, abdominal pain and constipation.  Musculoskeletal: Negative for joint pain and myalgias.  Skin: Negative.   Neurological: Negative for dizziness, weakness and headaches.  Psychiatric/Behavioral: Negative for depression. The patient is not nervous/anxious.      Physical Exam  Constitutional: She is oriented to person, place, and time. She appears well-developed and well-nourished. No distress.  Neck: Neck supple. No JVD present.  Cardiovascular: Normal rate, regular rhythm and intact distal pulses.   Respiratory: Effort normal and breath sounds normal. No respiratory distress. She has no wheezes.  GI: Soft. Bowel sounds are normal. She exhibits no distension. There is no tenderness.  Musculoskeletal: She exhibits no edema.  Is able to move all extremities; right leg with splint in place   Neurological: She is alert and oriented to person, place, and time.  Skin: Skin is warm and dry. She is not diaphoretic.   ASSESSMENT/ PLAN:  1. Afib; cva; anticoagulation management: for inr 1.2 will begin 3 mg coumadin 3 mg daily and will check inr on Wednesday will monitor her status.

## 2013-07-30 ENCOUNTER — Non-Acute Institutional Stay (SKILLED_NURSING_FACILITY): Payer: Medicare Other | Admitting: Adult Health

## 2013-07-30 DIAGNOSIS — I6350 Cerebral infarction due to unspecified occlusion or stenosis of unspecified cerebral artery: Secondary | ICD-10-CM

## 2013-07-30 DIAGNOSIS — Z7901 Long term (current) use of anticoagulants: Secondary | ICD-10-CM

## 2013-07-30 DIAGNOSIS — I4891 Unspecified atrial fibrillation: Secondary | ICD-10-CM

## 2013-07-30 DIAGNOSIS — I639 Cerebral infarction, unspecified: Secondary | ICD-10-CM

## 2013-08-01 NOTE — Progress Notes (Signed)
Patient ID: Tammy Sharp, female   DOB: 02/08/1925, 80 y.o.   MRN: [id number]     [facility]  No Known Allergies   Chief Complaint  Patient presents with  . Acute Visit    coumadin management     HPI:  She is being seen for coumadin management. She is presently taking coumadin 3 mg daily her inr is 1.3. She is on long term coumadin therapy for afib and cva. There are no reports of any concerns by nursing staff at this time.   Past Medical History  Diagnosis Date  . Hypertension   . Diabetes mellitus without complication   . Stroke   . A-fib     Past Surgical History  Procedure Laterality Date  . Joint replacement    . Fracture surgery    . Orif femur decompression Right 2014    R mid femur fracture, between R hip and kneee prosthesis  . Orif femur fracture Right 06/17/2013    Procedure: OPEN REDUCTION INTERNAL FIXATION (ORIF) DISTAL FEMUR FRACTURE;  Surgeon: Sheral Apley, MD;  Location: [facility] OR;  Service: Orthopedics;  Laterality: Right;    VITAL SIGNS BP 128/75  Pulse 80  Ht 5\' 7"  (1.702 m)  Wt 200 lb (90.719 kg)  BMI 31.32 kg/m2   Patient's Medications  New Prescriptions   No medications on file  Previous Medications   ACETAMINOPHEN (TYLENOL) 325 MG TABLET    Take 650 mg by mouth every 6 (six) hours as needed for mild pain.   ATENOLOL (TENORMIN) 50 MG TABLET    Take 50 mg by mouth daily.   DOCUSATE SODIUM (COLACE) 100 MG CAPSULE    Take 100 mg by mouth 2 (two) times daily.   HYDROCODONE-ACETAMINOPHEN (NORCO/VICODIN) 5-325 MG PER TABLET    Take 1-2 tablets by mouth every 4 (four) hours as needed.   INSULIN DETEMIR (LEVEMIR) 100 UNIT/ML INJECTION    Inject 15 Units into the skin 2 (two) times daily.   INSULIN LISPRO (HUMALOG) 100 UNIT/ML INJECTION    Inject 5 Units into the skin 3 (three) times daily before meals.   IRON POLYSACCHARIDES (NIFEREX) 150 MG CAPSULE    Take 150 mg by mouth daily.   LATANOPROST (XALATAN) 0.005 % OPHTHALMIC SOLUTION    Place 1  drop into both eyes at bedtime.   MAGNESIUM OXIDE (MAG-OX) 400 MG TABLET    Take 400 mg by mouth daily.   METHOCARBAMOL (ROBAXIN) 500 MG TABLET    Take 500 mg by mouth 3 (three) times daily as needed for muscle spasms.   NITROGLYCERIN (NITROSTAT) 0.4 MG SL TABLET    Place 0.4 mg under the tongue every 5 (five) minutes as needed for chest pain.   OMEPRAZOLE (PRILOSEC) 20 MG CAPSULE    Take 20 mg by mouth daily.   ONDANSETRON (ZOFRAN) 4 MG TABLET    Take 4 mg by mouth every 8 (eight) hours as needed for nausea or vomiting.   POLYETHYLENE GLYCOL (MIRALAX / GLYCOLAX) PACKET    Take 17 g by mouth daily as needed for moderate constipation or severe constipation.   WARFARIN (COUMADIN) 5 MG TABLET    Take 3 mg by mouth at bedtime.   Modified Medications   No medications on file  Discontinued Medications   No medications on file    SIGNIFICANT DIAGNOSTIC EXAMS  06-16-13: right knee x-ray: Lateral displacement of femoral fracture fixation hardware indicating hardware failure, with protrusion of a surgical screw through the skin.  Comminuted distal femoral fracture with 1 full shaft width overlap of the fracture fragments.  06-16-13: right femur x-ray: Prior right hip and knee replacements. Prior ORIF of a distal femoral diaphyseal fracture with displacement of the dominant distal femoral fragment from the plate. Inferior most screw extends has withdrawn from the plate and extends external to distal femoral soft tissues.  06-16-13: 2-d echo: Left ventricle: The cavity size was normal. Wall thickness was normal. Systolic function was normal. The estimated ejection fraction was in the range of 55% to 60%. Wall motion was normal; there were no regional wall motion abnormalities. - Mitral valve: Mild regurgitation. - Right atrium: The atrium was mildly dilated.  06-16-13: chest x-ray: No active disease.     LABS REVIEWED:   06-14-13: wbc 12.-; hgb 10.8; hct 35.7; mcv 102.9; plt 585; glucose 145; bun  16; creat 0.7; k+3.8; na++141 liver normal albumin 3.1  06-16-13: hgb a1c 5.5 06-18-13: wbc 9.2; hgb 9.8; hct 29.9; mcv 94.3; plt 387; glucose 157; bun 11; creat 0.53; k+4.4; na++136 06-25-13: wbc 5.6; hgb 9.1; hct 32.4 ;mcv 100.3; plt 412; glucose 129; bun 12; creat 0.5; k+3.6; na++144; liver normal albumin 2.8; mag 1.8  INR 1.5  07-12-13: mag 1.9      Review of Systems  Constitutional: Negative for malaise/fatigue.  Eyes: Negative for blurred vision.  Respiratory: Negative for cough and shortness of breath.   Cardiovascular: Negative for chest pain, palpitations and leg swelling.  Gastrointestinal: Negative for heartburn, abdominal pain and constipation.  Musculoskeletal: Negative for joint pain and myalgias.  Skin: Negative.   Neurological: Negative for dizziness, weakness and headaches.  Psychiatric/Behavioral: Negative for depression. The patient is not nervous/anxious.      Physical Exam  Constitutional: She is oriented to person, place, and time. She appears well-developed and well-nourished. No distress.  Neck: Neck supple. No JVD present.  Cardiovascular: Normal rate, regular rhythm and intact distal pulses.   Respiratory: Effort normal and breath sounds normal. No respiratory distress. She has no wheezes.  GI: Soft. Bowel sounds are normal. She exhibits no distension. There is no tenderness.  Musculoskeletal: She exhibits no edema.  Is able to move all extremities; right leg with splint in place   Neurological: She is alert and oriented to person, place, and time.  Skin: Skin is warm and dry. She is not diaphoretic.    ASSESSMENT/ PLAN:  Afib/cva/anticoagulation management: for her inr of 1.3 will begin coumadin 4 mg daily and will check inr on Friday. Will monitor her status.

## 2013-08-02 ENCOUNTER — Non-Acute Institutional Stay (SKILLED_NURSING_FACILITY): Payer: Medicare Other | Admitting: Adult Health

## 2013-08-02 DIAGNOSIS — I639 Cerebral infarction, unspecified: Secondary | ICD-10-CM

## 2013-08-02 DIAGNOSIS — I6350 Cerebral infarction due to unspecified occlusion or stenosis of unspecified cerebral artery: Secondary | ICD-10-CM

## 2013-08-02 DIAGNOSIS — Z7901 Long term (current) use of anticoagulants: Secondary | ICD-10-CM

## 2013-08-02 DIAGNOSIS — I4891 Unspecified atrial fibrillation: Secondary | ICD-10-CM

## 2013-08-11 ENCOUNTER — Non-Acute Institutional Stay (SKILLED_NURSING_FACILITY): Payer: Medicare Other | Admitting: Adult Health

## 2013-08-11 DIAGNOSIS — I4891 Unspecified atrial fibrillation: Secondary | ICD-10-CM

## 2013-08-11 DIAGNOSIS — Z7901 Long term (current) use of anticoagulants: Secondary | ICD-10-CM

## 2013-08-11 NOTE — Progress Notes (Signed)
Patient ID: Tammy Sharp, female   DOB: 1924/12/27, 80 y.o.   MRN: [id number]     [facility]  No Known Allergies   Chief Complaint  Patient presents with  . Acute Visit    coumadin management     HPI:  She is on long term coumadin therapy for her afib. Her heart rate is stable. Her inr today is 1.5 and she is taking coumadin 5 mg daily. She is tolerating medication without difficulty. There are no concerns being voiced at this time.   Past Medical History  Diagnosis Date  . Hypertension   . Diabetes mellitus without complication   . Stroke   . A-fib     Past Surgical History  Procedure Laterality Date  . Joint replacement    . Fracture surgery    . Orif femur decompression Right 2014    R mid femur fracture, between R hip and kneee prosthesis  . Orif femur fracture Right 06/17/2013    Procedure: OPEN REDUCTION INTERNAL FIXATION (ORIF) DISTAL FEMUR FRACTURE;  Surgeon: Sheral Apleyimothy D Murphy, MD;  Location: [facility] OR;  Service: Orthopedics;  Laterality: Right;    VITAL SIGNS BP 139/79  Pulse 70  Ht 5\' 7"  (1.702 m)  Wt 200 lb (90.719 kg)  BMI 31.32 kg/m2   Patient's Medications  New Prescriptions   No medications on file  Previous Medications   ACETAMINOPHEN (TYLENOL) 325 MG TABLET    Take 650 mg by mouth every 6 (six) hours as needed for mild pain.   ATENOLOL (TENORMIN) 50 MG TABLET    Take 50 mg by mouth daily.   DOCUSATE SODIUM (COLACE) 100 MG CAPSULE    Take 100 mg by mouth 2 (two) times daily.   HYDROCODONE-ACETAMINOPHEN (NORCO/VICODIN) 5-325 MG PER TABLET    Take 1-2 tablets by mouth every 4 (four) hours as needed.   INSULIN DETEMIR (LEVEMIR) 100 UNIT/ML INJECTION    Inject 15 Units into the skin 2 (two) times daily.   INSULIN LISPRO (HUMALOG) 100 UNIT/ML INJECTION    Inject 5 Units into the skin 3 (three) times daily before meals.   IRON POLYSACCHARIDES (NIFEREX) 150 MG CAPSULE    Take 150 mg by mouth daily.   LATANOPROST (XALATAN) 0.005 % OPHTHALMIC SOLUTION     Place 1 drop into both eyes at bedtime.   MAGNESIUM OXIDE (MAG-OX) 400 MG TABLET    Take 400 mg by mouth daily.   METHOCARBAMOL (ROBAXIN) 500 MG TABLET    Take 500 mg by mouth 3 (three) times daily as needed for muscle spasms.   NITROGLYCERIN (NITROSTAT) 0.4 MG SL TABLET    Place 0.4 mg under the tongue every 5 (five) minutes as needed for chest pain.   OMEPRAZOLE (PRILOSEC) 20 MG CAPSULE    Take 20 mg by mouth daily.   ONDANSETRON (ZOFRAN) 4 MG TABLET    Take 4 mg by mouth every 8 (eight) hours as needed for nausea or vomiting.   POLYETHYLENE GLYCOL (MIRALAX / GLYCOLAX) PACKET    Take 17 g by mouth daily as needed for moderate constipation or severe constipation.   WARFARIN (COUMADIN) 5 MG TABLET    Take 3 mg by mouth at bedtime.   Modified Medications   No medications on file  Discontinued Medications   No medications on file    SIGNIFICANT DIAGNOSTIC EXAMS  06-16-13: right knee x-ray: Lateral displacement of femoral fracture fixation hardware indicating hardware failure, with protrusion of a surgical screw through the skin.  Comminuted distal femoral fracture with 1 full shaft width overlap of the fracture fragments.  06-16-13: right femur x-ray: Prior right hip and knee replacements. Prior ORIF of a distal femoral diaphyseal fracture with displacement of the dominant distal femoral fragment from the plate. Inferior most screw extends has withdrawn from the plate and extends external to distal femoral soft tissues.  06-16-13: 2-d echo: Left ventricle: The cavity size was normal. Wall thickness was normal. Systolic function was normal. The estimated ejection fraction was in the range of 55% to 60%. Wall motion was normal; there were no regional wall motion abnormalities. - Mitral valve: Mild regurgitation. - Right atrium: The atrium was mildly dilated.  06-16-13: chest x-ray: No active disease.     LABS REVIEWED:   06-14-13: wbc 12.-; hgb 10.8; hct 35.7; mcv 102.9; plt 585; glucose  145; bun 16; creat 0.7; k+3.8; na++141 liver normal albumin 3.1  06-16-13: hgb a1c 5.5 06-18-13: wbc 9.2; hgb 9.8; hct 29.9; mcv 94.3; plt 387; glucose 157; bun 11; creat 0.53; k+4.4; na++136 06-25-13: wbc 5.6; hgb 9.1; hct 32.4 ;mcv 100.3; plt 412; glucose 129; bun 12; creat 0.5; k+3.6; na++144; liver normal albumin 2.8; mag 1.8  INR 1.5  07-12-13: mag 1.9      Review of Systems  Constitutional: Negative for malaise/fatigue.  Eyes: Negative for blurred vision.  Respiratory: Negative for cough and shortness of breath.   Cardiovascular: Negative for chest pain, palpitations and leg swelling.  Gastrointestinal: Negative for heartburn, abdominal pain and constipation.  Musculoskeletal: Negative for joint pain and myalgias.  Skin: Negative.   Neurological: Negative for dizziness, weakness and headaches.  Psychiatric/Behavioral: Negative for depression. The patient is not nervous/anxious.      Physical Exam  Constitutional: She is oriented to person, place, and time. She appears well-developed and well-nourished. No distress.  Neck: Neck supple. No JVD present.  Cardiovascular: Normal rate, regular rhythm and intact distal pulses.   Respiratory: Effort normal and breath sounds normal. No respiratory distress. She has no wheezes.  GI: Soft. Bowel sounds are normal. She exhibits no distension. There is no tenderness.  Musculoskeletal: She exhibits no edema.  Is able to move all extremities; right leg with splint in place   Neurological: She is alert and oriented to person, place, and time.  Skin: Skin is warm and dry. She is not diaphoretic.    ASSESSMENT/ PLAN:  1. Afib/ anticoagulation management: for inr 1.5 will begin coumadin 6 mg daily and will check inr on Friday and will monitor her status.

## 2013-08-13 ENCOUNTER — Non-Acute Institutional Stay (SKILLED_NURSING_FACILITY): Payer: Medicare Other | Admitting: Adult Health

## 2013-08-13 DIAGNOSIS — Z7901 Long term (current) use of anticoagulants: Secondary | ICD-10-CM

## 2013-08-13 DIAGNOSIS — I639 Cerebral infarction, unspecified: Secondary | ICD-10-CM

## 2013-08-13 DIAGNOSIS — I4891 Unspecified atrial fibrillation: Secondary | ICD-10-CM

## 2013-08-13 DIAGNOSIS — I6350 Cerebral infarction due to unspecified occlusion or stenosis of unspecified cerebral artery: Secondary | ICD-10-CM

## 2013-08-15 ENCOUNTER — Encounter: Payer: Self-pay | Admitting: Adult Health

## 2013-08-15 NOTE — Progress Notes (Signed)
Patient ID: Tammy Sharp, female   DOB: May 17, 1925, 80 y.o.   MRN: [id number]     [facility]  No Known Allergies   Chief Complaint  Patient presents with  . Acute Visit    coumadin management     HPI:  She is being seen for her coumadin management. She is on long term coumadin for afib and old cva. Her inr today is 2.9 and she is taking coumadin 6 mg daily. There are no concerns being voiced at this time.   Past Medical History  Diagnosis Date  . Hypertension   . Diabetes mellitus without complication   . Stroke   . A-fib     Past Surgical History  Procedure Laterality Date  . Joint replacement    . Fracture surgery    . Orif femur decompression Right 2014    R mid femur fracture, between R hip and kneee prosthesis  . Orif femur fracture Right 06/17/2013    Procedure: OPEN REDUCTION INTERNAL FIXATION (ORIF) DISTAL FEMUR FRACTURE;  Surgeon: Sheral Apleyimothy D Murphy, MD;  Location: [facility] OR;  Service: Orthopedics;  Laterality: Right;    VITAL SIGNS BP 119/68  Pulse 75  Ht 5\' 7"  (1.702 m)  Wt 200 lb (90.719 kg)  BMI 31.32 kg/m2   Patient's Medications  New Prescriptions   No medications on file  Previous Medications   ACETAMINOPHEN (TYLENOL) 325 MG TABLET    Take 650 mg by mouth every 6 (six) hours as needed for mild pain.   ATENOLOL (TENORMIN) 50 MG TABLET    Take 50 mg by mouth daily.   DOCUSATE SODIUM (COLACE) 100 MG CAPSULE    Take 100 mg by mouth 2 (two) times daily.   HYDROCODONE-ACETAMINOPHEN (NORCO/VICODIN) 5-325 MG PER TABLET    Take 1-2 tablets by mouth every 4 (four) hours as needed.   INSULIN DETEMIR (LEVEMIR) 100 UNIT/ML INJECTION    Inject 15 Units into the skin 2 (two) times daily.   INSULIN LISPRO (HUMALOG) 100 UNIT/ML INJECTION    Inject 5 Units into the skin 3 (three) times daily before meals.   IRON POLYSACCHARIDES (NIFEREX) 150 MG CAPSULE    Take 150 mg by mouth daily.   LATANOPROST (XALATAN) 0.005 % OPHTHALMIC SOLUTION    Place 1 drop into both  eyes at bedtime.   MAGNESIUM OXIDE (MAG-OX) 400 MG TABLET    Take 400 mg by mouth daily.   METHOCARBAMOL (ROBAXIN) 500 MG TABLET    Take 500 mg by mouth 3 (three) times daily as needed for muscle spasms.   NITROGLYCERIN (NITROSTAT) 0.4 MG SL TABLET    Place 0.4 mg under the tongue every 5 (five) minutes as needed for chest pain.   OMEPRAZOLE (PRILOSEC) 20 MG CAPSULE    Take 20 mg by mouth daily.   ONDANSETRON (ZOFRAN) 4 MG TABLET    Take 4 mg by mouth every 8 (eight) hours as needed for nausea or vomiting.   POLYETHYLENE GLYCOL (MIRALAX / GLYCOLAX) PACKET    Take 17 g by mouth daily as needed for moderate constipation or severe constipation.   WARFARIN (COUMADIN) 5 MG TABLET    Take 6 mg by mouth at bedtime.   Modified Medications   No medications on file  Discontinued Medications   No medications on file    SIGNIFICANT DIAGNOSTIC EXAMS   06-16-13: right knee x-ray: Lateral displacement of femoral fracture fixation hardware indicating hardware failure, with protrusion of a surgical screw through the skin. Comminuted  distal femoral fracture with 1 full shaft width overlap of the fracture fragments.  06-16-13: right femur x-ray: Prior right hip and knee replacements. Prior ORIF of a distal femoral diaphyseal fracture with displacement of the dominant distal femoral fragment from the plate. Inferior most screw extends has withdrawn from the plate and extends external to distal femoral soft tissues.  06-16-13: 2-d echo: Left ventricle: The cavity size was normal. Wall thickness was normal. Systolic function was normal. The estimated ejection fraction was in the range of 55% to 60%. Wall motion was normal; there were no regional wall motion abnormalities. - Mitral valve: Mild regurgitation. - Right atrium: The atrium was mildly dilated.  06-16-13: chest x-ray: No active disease.     LABS REVIEWED:   06-14-13: wbc 12.-; hgb 10.8; hct 35.7; mcv 102.9; plt 585; glucose 145; bun 16; creat 0.7;  k+3.8; na++141 liver normal albumin 3.1  06-16-13: hgb a1c 5.5 06-18-13: wbc 9.2; hgb 9.8; hct 29.9; mcv 94.3; plt 387; glucose 157; bun 11; creat 0.53; k+4.4; na++136 06-25-13: wbc 5.6; hgb 9.1; hct 32.4 ;mcv 100.3; plt 412; glucose 129; bun 12; creat 0.5; k+3.6; na++144; liver normal albumin 2.8; mag 1.8  INR 1.5  07-12-13: mag 1.9      Review of Systems  Constitutional: Negative for malaise/fatigue.  Eyes: Negative for blurred vision.  Respiratory: Negative for cough and shortness of breath.   Cardiovascular: Negative for chest pain, palpitations and leg swelling.  Gastrointestinal: Negative for heartburn, abdominal pain and constipation.  Musculoskeletal: Negative for joint pain and myalgias.  Skin: Negative.   Neurological: Negative for dizziness, weakness and headaches.  Psychiatric/Behavioral: Negative for depression. The patient is not nervous/anxious.      Physical Exam  Constitutional: She is oriented to person, place, and time. She appears well-developed and well-nourished. No distress.  Neck: Neck supple. No JVD present.  Cardiovascular: Normal rate, regular rhythm and intact distal pulses.   Respiratory: Effort normal and breath sounds normal. No respiratory distress. She has no wheezes.  GI: Soft. Bowel sounds are normal. She exhibits no distension. There is no tenderness.  Musculoskeletal: She exhibits no edema.  Is able to move all extremities; right leg with splint in place   Neurological: She is alert and oriented to person, place, and time.  Skin: Skin is warm and dry. She is not diaphoretic.   ASSESSMENT/ PLAN:  1. Afib/cva/anticoagulation management: for inr of 2.0 will continue coumadin 6 mg daily and will check inr on Wednesday will monitor. Her heart rate is stable and she remain neurologically stable.

## 2013-08-16 ENCOUNTER — Other Ambulatory Visit: Payer: Self-pay | Admitting: *Deleted

## 2013-08-16 MED ORDER — HYDROCODONE-ACETAMINOPHEN 5-325 MG PO TABS: ORAL_TABLET | ORAL | Status: DC

## 2013-08-16 NOTE — Progress Notes (Signed)
Patient ID: Tammy Sharp, female   DOB: 1924-09-14, 80 y.o.   MRN: [id number]     [facility]  No Known Allergies   Chief Complaint  Patient presents with  . Acute Visit    coumadin management     HPI:  She is on long term coumadin management for her afib and cva. Her inr today is 1.5  And she is taking coumadin 4 mg daily. She is not voicing any concerns today; and overall is doing well.   Past Medical History  Diagnosis Date  . Hypertension   . Diabetes mellitus without complication   . Stroke   . A-fib     Past Surgical History  Procedure Laterality Date  . Joint replacement    . Fracture surgery    . Orif femur decompression Right 2014    R mid femur fracture, between R hip and kneee prosthesis  . Orif femur fracture Right 06/17/2013    Procedure: OPEN REDUCTION INTERNAL FIXATION (ORIF) DISTAL FEMUR FRACTURE;  Surgeon: Sheral Apleyimothy D Murphy, MD;  Location: [facility] OR;  Service: Orthopedics;  Laterality: Right;    VITAL SIGNS BP 129/79  Pulse 57  Wt 200 lb (90.719 kg)   Patient's Medications  New Prescriptions   No medications on file  Previous Medications   ACETAMINOPHEN (TYLENOL) 325 MG TABLET    Take 650 mg by mouth every 6 (six) hours as needed for mild pain.   ATENOLOL (TENORMIN) 50 MG TABLET    Take 50 mg by mouth daily.   DOCUSATE SODIUM (COLACE) 100 MG CAPSULE    Take 100 mg by mouth 2 (two) times daily.   HYDROCODONE-ACETAMINOPHEN (NORCO/VICODIN) 5-325 MG PER TABLET    Take one tablet by mouth twice daily at 8am and 8pm for post surgery pain. Hold for sedation   INSULIN DETEMIR (LEVEMIR) 100 UNIT/ML INJECTION    Inject 15 Units into the skin 2 (two) times daily.   INSULIN LISPRO (HUMALOG) 100 UNIT/ML INJECTION    Inject 5 Units into the skin 3 (three) times daily before meals.   IRON POLYSACCHARIDES (NIFEREX) 150 MG CAPSULE    Take 150 mg by mouth daily.   LATANOPROST (XALATAN) 0.005 % OPHTHALMIC SOLUTION    Place 1 drop into both eyes at bedtime.   MAGNESIUM OXIDE (MAG-OX) 400 MG TABLET    Take 400 mg by mouth daily.   METHOCARBAMOL (ROBAXIN) 500 MG TABLET    Take 500 mg by mouth 3 (three) times daily as needed for muscle spasms.   NITROGLYCERIN (NITROSTAT) 0.4 MG SL TABLET    Place 0.4 mg under the tongue every 5 (five) minutes as needed for chest pain.   OMEPRAZOLE (PRILOSEC) 20 MG CAPSULE    Take 20 mg by mouth daily.   ONDANSETRON (ZOFRAN) 4 MG TABLET    Take 4 mg by mouth every 8 (eight) hours as needed for nausea or vomiting.   POLYETHYLENE GLYCOL (MIRALAX / GLYCOLAX) PACKET    Take 17 g by mouth daily as needed for moderate constipation or severe constipation.   WARFARIN (COUMADIN) 5 MG TABLET    Take 6 mg by mouth at bedtime.   Modified Medications   No medications on file  Discontinued Medications   No medications on file    SIGNIFICANT DIAGNOSTIC EXAMS  06-16-13: right knee x-ray: Lateral displacement of femoral fracture fixation hardware indicating hardware failure, with protrusion of a surgical screw through the skin. Comminuted distal femoral fracture with 1 full shaft width  overlap of the fracture fragments.  06-16-13: right femur x-ray: Prior right hip and knee replacements. Prior ORIF of a distal femoral diaphyseal fracture with displacement of the dominant distal femoral fragment from the plate. Inferior most screw extends has withdrawn from the plate and extends external to distal femoral soft tissues.  06-16-13: 2-d echo: Left ventricle: The cavity size was normal. Wall thickness was normal. Systolic function was normal. The estimated ejection fraction was in the range of 55% to 60%. Wall motion was normal; there were no regional wall motion abnormalities. - Mitral valve: Mild regurgitation. - Right atrium: The atrium was mildly dilated.  06-16-13: chest x-ray: No active disease.     LABS REVIEWED:   06-14-13: wbc 12.-; hgb 10.8; hct 35.7; mcv 102.9; plt 585; glucose 145; bun 16; creat 0.7; k+3.8; na++141 liver  normal albumin 3.1  06-16-13: hgb a1c 5.5 06-18-13: wbc 9.2; hgb 9.8; hct 29.9; mcv 94.3; plt 387; glucose 157; bun 11; creat 0.53; k+4.4; na++136 06-25-13: wbc 5.6; hgb 9.1; hct 32.4 ;mcv 100.3; plt 412; glucose 129; bun 12; creat 0.5; k+3.6; na++144; liver normal albumin 2.8; mag 1.8  INR 1.5  07-12-13: mag 1.9      Review of Systems  Constitutional: Negative for malaise/fatigue.  Eyes: Negative for blurred vision.  Respiratory: Negative for cough and shortness of breath.   Cardiovascular: Negative for chest pain, palpitations and leg swelling.  Gastrointestinal: Negative for heartburn, abdominal pain and constipation.  Musculoskeletal: Negative for joint pain and myalgias.  Skin: Negative.   Neurological: Negative for dizziness, weakness and headaches.  Psychiatric/Behavioral: Negative for depression. The patient is not nervous/anxious.      Physical Exam  Constitutional: She is oriented to person, place, and time. She appears well-developed and well-nourished. No distress.  Neck: Neck supple. No JVD present.  Cardiovascular: Normal rate, regular rhythm and intact distal pulses.   Respiratory: Effort normal and breath sounds normal. No respiratory distress. She has no wheezes.  GI: Soft. Bowel sounds are normal. She exhibits no distension. There is no tenderness.  Musculoskeletal: She exhibits no edema.  Is able to move all extremities; right leg with splint in place   Neurological: She is alert and oriented to person, place, and time.  Skin: Skin is warm and dry. She is not diaphoretic.    ASSESSMENT/ PLAN:  Afib/cva/anticoagulation management: for her inr of 1.5 will begin coumadin 5 mg daily and will check inr on Monday. Will monitor her status.

## 2013-08-16 NOTE — Progress Notes (Signed)
Patient ID: Tammy Sharp, female   DOB: 12/08/1924, 80 y.o.   MRN: [id number]     [facility]  No Known Allergies   Chief Complaint  Patient presents with  . Acute Visit    coumadin management     HPI:  She is on long term coumadin management for her afib and cva. Her inr today is 2.1 and she is taking coumadin 5 mg daily. There are no concerns being voiced today by the nursing staff. She is not voicing any concerns.   Past Medical History  Diagnosis Date  . Hypertension   . Diabetes mellitus without complication   . Stroke   . A-fib     Past Surgical History  Procedure Laterality Date  . Joint replacement    . Fracture surgery    . Orif femur decompression Right 2014    R mid femur fracture, between R hip and kneee prosthesis  . Orif femur fracture Right 06/17/2013    Procedure: OPEN REDUCTION INTERNAL FIXATION (ORIF) DISTAL FEMUR FRACTURE;  Surgeon: Sheral Apley, MD;  Location: [facility] OR;  Service: Orthopedics;  Laterality: Right;    VITAL SIGNS BP 138/79  Pulse 70  Ht 5\' 7"  (1.702 m)  Wt 184 lb 14.4 oz (83.87 kg)  BMI 28.95 kg/m2   Patient's Medications  New Prescriptions   No medications on file  Previous Medications   ACETAMINOPHEN (TYLENOL) 325 MG TABLET    Take 650 mg by mouth every 6 (six) hours as needed for mild pain.   ATENOLOL (TENORMIN) 50 MG TABLET    Take 50 mg by mouth daily.   DOCUSATE SODIUM (COLACE) 100 MG CAPSULE    Take 100 mg by mouth 2 (two) times daily.   HYDROCODONE-ACETAMINOPHEN (NORCO/VICODIN) 5-325 MG PER TABLET    Take one tablet by mouth twice daily at 8am and 8pm for post surgery pain. Hold for sedation   INSULIN DETEMIR (LEVEMIR) 100 UNIT/ML INJECTION    Inject 15 Units into the skin 2 (two) times daily.   INSULIN LISPRO (HUMALOG) 100 UNIT/ML INJECTION    Inject 5 Units into the skin 3 (three) times daily before meals.   IRON POLYSACCHARIDES (NIFEREX) 150 MG CAPSULE    Take 150 mg by mouth daily.   LATANOPROST (XALATAN)  0.005 % OPHTHALMIC SOLUTION    Place 1 drop into both eyes at bedtime.   MAGNESIUM OXIDE (MAG-OX) 400 MG TABLET    Take 400 mg by mouth daily.   METHOCARBAMOL (ROBAXIN) 500 MG TABLET    Take 500 mg by mouth 3 (three) times daily as needed for muscle spasms.   NITROGLYCERIN (NITROSTAT) 0.4 MG SL TABLET    Place 0.4 mg under the tongue every 5 (five) minutes as needed for chest pain.   OMEPRAZOLE (PRILOSEC) 20 MG CAPSULE    Take 20 mg by mouth daily.   ONDANSETRON (ZOFRAN) 4 MG TABLET    Take 4 mg by mouth every 8 (eight) hours as needed for nausea or vomiting.   POLYETHYLENE GLYCOL (MIRALAX / GLYCOLAX) PACKET    Take 17 g by mouth daily as needed for moderate constipation or severe constipation.   WARFARIN (COUMADIN) 5 MG TABLET    Take 6 mg by mouth at bedtime.   Modified Medications   No medications on file  Discontinued Medications   No medications on file    SIGNIFICANT DIAGNOSTIC EXAMS   06-16-13: right knee x-ray: Lateral displacement of femoral fracture fixation hardware indicating hardware failure,  with protrusion of a surgical screw through the skin. Comminuted distal femoral fracture with 1 full shaft width overlap of the fracture fragments.  06-16-13: right femur x-ray: Prior right hip and knee replacements. Prior ORIF of a distal femoral diaphyseal fracture with displacement of the dominant distal femoral fragment from the plate. Inferior most screw extends has withdrawn from the plate and extends external to distal femoral soft tissues.  06-16-13: 2-d echo: Left ventricle: The cavity size was normal. Wall thickness was normal. Systolic function was normal. The estimated ejection fraction was in the range of 55% to 60%. Wall motion was normal; there were no regional wall motion abnormalities. - Mitral valve: Mild regurgitation. - Right atrium: The atrium was mildly dilated.  06-16-13: chest x-ray: No active disease.     LABS REVIEWED:   06-14-13: wbc 12.0; hgb 10.8; hct  35.7; mcv 102.9; plt 585; glucose 145; bun 16; creat 0.7; k+3.8; na++141 liver normal albumin 3.1  06-16-13: hgb a1c 5.5 06-18-13: wbc 9.2; hgb 9.8; hct 29.9; mcv 94.3; plt 387; glucose 157; bun 11; creat 0.53; k+4.4; na++136 06-25-13: wbc 5.6; hgb 9.1; hct 32.4 ;mcv 100.3; plt 412; glucose 129; bun 12; creat 0.5; k+3.6; na++144; liver normal albumin 2.8; mag 1.8  INR 1.5  07-12-13: mag 1.9      Review of Systems  Constitutional: Negative for malaise/fatigue.  Eyes: Negative for blurred vision.  Respiratory: Negative for cough and shortness of breath.   Cardiovascular: Negative for chest pain, palpitations and leg swelling.  Gastrointestinal: Negative for heartburn, abdominal pain and constipation.  Musculoskeletal: Negative for joint pain and myalgias.  Skin: Negative.   Neurological: Negative for dizziness, weakness and headaches.  Psychiatric/Behavioral: Negative for depression. The patient is not nervous/anxious.      Physical Exam  Constitutional: She is oriented to person, place, and time. She appears well-developed and well-nourished. No distress.  Neck: Neck supple. No JVD present.  Cardiovascular: Normal rate, regular rhythm and intact distal pulses.   Respiratory: Effort normal and breath sounds normal. No respiratory distress. She has no wheezes.  GI: Soft. Bowel sounds are normal. She exhibits no distension. There is no tenderness.  Musculoskeletal: She exhibits no edema.  Is able to move all extremities; right leg with splint in place   Neurological: She is alert and oriented to person, place, and time.  Skin: Skin is warm and dry. She is not diaphoretic.    ASSESSMENT/ PLAN:  Afib/cva/anticoagulation management: for her inr of 2.1  Will continue  coumadin 5 mg daily and will check inr in one week. Will monitor her status.

## 2013-08-16 NOTE — Telephone Encounter (Signed)
[facility] 

## 2013-08-18 ENCOUNTER — Encounter: Payer: Self-pay | Admitting: Adult Health

## 2013-08-18 ENCOUNTER — Non-Acute Institutional Stay (SKILLED_NURSING_FACILITY): Payer: Medicare Other | Admitting: Adult Health

## 2013-08-18 DIAGNOSIS — D62 Acute posthemorrhagic anemia: Secondary | ICD-10-CM

## 2013-08-18 DIAGNOSIS — I10 Essential (primary) hypertension: Secondary | ICD-10-CM

## 2013-08-18 DIAGNOSIS — I6350 Cerebral infarction due to unspecified occlusion or stenosis of unspecified cerebral artery: Secondary | ICD-10-CM

## 2013-08-18 DIAGNOSIS — E1165 Type 2 diabetes mellitus with hyperglycemia: Secondary | ICD-10-CM

## 2013-08-18 DIAGNOSIS — I639 Cerebral infarction, unspecified: Secondary | ICD-10-CM

## 2013-08-18 DIAGNOSIS — I4891 Unspecified atrial fibrillation: Secondary | ICD-10-CM

## 2013-08-18 DIAGNOSIS — S72309A Unspecified fracture of shaft of unspecified femur, initial encounter for closed fracture: Secondary | ICD-10-CM

## 2013-08-18 DIAGNOSIS — S72301A Unspecified fracture of shaft of right femur, initial encounter for closed fracture: Secondary | ICD-10-CM

## 2013-08-18 DIAGNOSIS — Z7901 Long term (current) use of anticoagulants: Secondary | ICD-10-CM

## 2013-08-18 DIAGNOSIS — K219 Gastro-esophageal reflux disease without esophagitis: Secondary | ICD-10-CM

## 2013-08-18 DIAGNOSIS — K5900 Constipation, unspecified: Secondary | ICD-10-CM | POA: Insufficient documentation

## 2013-08-18 DIAGNOSIS — IMO0001 Reserved for inherently not codable concepts without codable children: Secondary | ICD-10-CM

## 2013-08-18 NOTE — Progress Notes (Signed)
Patient ID: Tammy Sharp, female   DOB: [birth year], 80 y.o.   MRN: [id number]     [facility]  No Known Allergies   Chief Complaint  Patient presents with  . Medical Managment of Chronic Issues    HPI:  She is being seen for the management of her chronic illnesses. Overall her status is stable. She is due to see her orthopedic this week. She is hopeful that she will be allowed to bear weight so she can continue to progress in her therapy. She is not voicing any complaints. There are no concerns being voiced by the nursing staff at this time. She is presently being treated for an uti.    Past Medical History  Diagnosis Date  . Hypertension   . Diabetes mellitus without complication   . Stroke   . A-fib     Past Surgical History  Procedure Laterality Date  . Joint replacement    . Fracture surgery    . Orif femur decompression Right 2014    R mid femur fracture, between R hip and kneee prosthesis  . Orif femur fracture Right 06/17/2013    Procedure: OPEN REDUCTION INTERNAL FIXATION (ORIF) DISTAL FEMUR FRACTURE;  Surgeon: Sheral Apley, MD;  Location: [facility] OR;  Service: Orthopedics;  Laterality: Right;    VITAL SIGNS BP 103/57  Pulse 67  Ht 5\' 7"  (1.702 m)  Wt 185 lb (83.915 kg)  BMI 28.97 kg/m2   Patient's Medications  New Prescriptions   No medications on file  Previous Medications   ACETAMINOPHEN (TYLENOL) 325 MG TABLET    Take 650 mg by mouth every 6 (six) hours as needed for mild pain.   ATENOLOL (TENORMIN) 50 MG TABLET    Take 50 mg by mouth daily.   DOCUSATE SODIUM (COLACE) 100 MG CAPSULE    Take 100 mg by mouth 2 (two) times daily.   HYDROCODONE-ACETAMINOPHEN (NORCO/VICODIN) 5-325 MG PER TABLET    Take one tablet by mouth twice daily at 8am and 8pm for post surgery pain. Hold for sedation   INSULIN DETEMIR (LEVEMIR) 100 UNIT/ML INJECTION    Inject 15 Units into the skin 2 (two) times daily.   INSULIN LISPRO (HUMALOG) 100 UNIT/ML INJECTION    Inject 5  Units into the skin 3 (three) times daily before meals.   IRON POLYSACCHARIDES (NIFEREX) 150 MG CAPSULE    Take 150 mg by mouth daily.   LATANOPROST (XALATAN) 0.005 % OPHTHALMIC SOLUTION    Place 1 drop into both eyes at bedtime.   MAGNESIUM OXIDE (MAG-OX) 400 MG TABLET    Take 400 mg by mouth daily.   METHOCARBAMOL (ROBAXIN) 500 MG TABLET    Take 500 mg by mouth 3 (three) times daily as needed for muscle spasms.   NITROGLYCERIN (NITROSTAT) 0.4 MG SL TABLET    Place 0.4 mg under the tongue every 5 (five) minutes as needed for chest pain.   OMEPRAZOLE (PRILOSEC) 20 MG CAPSULE    Take 20 mg by mouth daily.   ONDANSETRON (ZOFRAN) 4 MG TABLET    Take 4 mg by mouth every 8 (eight) hours as needed for nausea or vomiting.   POLYETHYLENE GLYCOL (MIRALAX / GLYCOLAX) PACKET    Take 17 g by mouth daily as needed for moderate constipation or severe constipation.   WARFARIN (COUMADIN) 5 MG TABLET    Take 6 mg by mouth at bedtime.   Modified Medications   No medications on file  Discontinued Medications  No medications on file    SIGNIFICANT DIAGNOSTIC EXAMS  06-16-13: right knee x-ray: Lateral displacement of femoral fracture fixation hardware indicating hardware failure, with protrusion of a surgical screw through the skin. Comminuted distal femoral fracture with 1 full shaft width overlap of the fracture fragments.  06-16-13: right femur x-ray: Prior right hip and knee replacements. Prior ORIF of a distal femoral diaphyseal fracture with displacement of the dominant distal femoral fragment from the plate. Inferior most screw extends has withdrawn from the plate and extends external to distal femoral soft tissues.  06-16-13: 2-d echo: Left ventricle: The cavity size was normal. Wall thickness was normal. Systolic function was normal. The estimated ejection fraction was in the range of 55% to 60%. Wall motion was normal; there were no regional wall motion abnormalities. - Mitral valve: Mild  regurgitation. - Right atrium: The atrium was mildly dilated.  06-16-13: chest x-ray: No active disease.     LABS REVIEWED:   06-14-13: wbc 12.-; hgb 10.8; hct 35.7; mcv 102.9; plt 585; glucose 145; bun 16; creat 0.7; k+3.8; na++141 liver normal albumin 3.1  06-16-13: hgb a1c 5.5 06-18-13: wbc 9.2; hgb 9.8; hct 29.9; mcv 94.3; plt 387; glucose 157; bun 11; creat 0.53; k+4.4; na++136 06-25-13: wbc 5.6; hgb 9.1; hct 32.4 ;mcv 100.3; plt 412; glucose 129; bun 12; creat 0.5; k+3.6; na++144; liver normal albumin 2.8; mag 1.8  INR 1.5  07-12-13: mag 1.9  08-11-13: urine culture: e-coli: septra      Review of Systems  Constitutional: Negative for malaise/fatigue.  Eyes: Negative for blurred vision.  Respiratory: Negative for cough and shortness of breath.   Cardiovascular: Negative for chest pain, palpitations and leg swelling.  Gastrointestinal: Negative for heartburn, abdominal pain and constipation.  Musculoskeletal: Negative for joint pain and myalgias.  Skin: Negative.   Neurological: Negative for dizziness, weakness and headaches.  Psychiatric/Behavioral: Negative for depression. The patient is not nervous/anxious.      Physical Exam  Constitutional: She is oriented to person, place, and time. She appears well-developed and well-nourished. No distress.  Neck: Neck supple. No JVD present.  Cardiovascular: Normal rate, regular rhythm and intact distal pulses.   Respiratory: Effort normal and breath sounds normal. No respiratory distress. She has no wheezes.  GI: Soft. Bowel sounds are normal. She exhibits no distension. There is no tenderness.  Musculoskeletal: She exhibits no edema.  Is able to move all extremities; right leg with splint in place   Neurological: She is alert and oriented to person, place, and time.  Skin: Skin is warm and dry. She is not diaphoretic.   ASSESSMENT/ PLAN:  1. Hypertension;  Is stable will continue tenormin 50 mg daily   2. Anemia: will  continue ferrex 150 mg daily   3. GERD: will continue prilosec 20 mg daily  4. Diabetes: is stable will continue levemir 15 units twice daily and humalog 5 units three times daily and will monitor her status   5. Constipation: will continue colace twice daily and miralax daily as needed   6. Afib: her heart rate is stable; will continue tenormin for her rate control and will continue coumadin therapy.   7. CVA: is neurologically stable will continue coumadin therapy   8. anticoagulation management: for inr 5.3 will hold coumadin for 2 days and will check inr.   9. UTI: will complete abt and will monitor   10. Right femur fracture: will continue therapy as directed will continue vicodin 5/325 mg twice daily and every 4  hours as needed and will continue robaxin 500 mg nightly and tid prn and will monitor her status.   11. Glaucoma: will continue xalatan to both eyes nightly

## 2013-08-20 ENCOUNTER — Non-Acute Institutional Stay (SKILLED_NURSING_FACILITY): Payer: Medicare Other | Admitting: Adult Health

## 2013-08-20 DIAGNOSIS — I6350 Cerebral infarction due to unspecified occlusion or stenosis of unspecified cerebral artery: Secondary | ICD-10-CM

## 2013-08-20 DIAGNOSIS — I4891 Unspecified atrial fibrillation: Secondary | ICD-10-CM

## 2013-08-20 DIAGNOSIS — I639 Cerebral infarction, unspecified: Secondary | ICD-10-CM

## 2013-08-20 DIAGNOSIS — Z7901 Long term (current) use of anticoagulants: Secondary | ICD-10-CM

## 2013-08-23 ENCOUNTER — Non-Acute Institutional Stay (SKILLED_NURSING_FACILITY): Payer: Medicare Other | Admitting: Adult Health

## 2013-08-23 ENCOUNTER — Encounter: Payer: Self-pay | Admitting: Adult Health

## 2013-08-23 DIAGNOSIS — I6350 Cerebral infarction due to unspecified occlusion or stenosis of unspecified cerebral artery: Secondary | ICD-10-CM

## 2013-08-23 DIAGNOSIS — I639 Cerebral infarction, unspecified: Secondary | ICD-10-CM

## 2013-08-23 DIAGNOSIS — Z7901 Long term (current) use of anticoagulants: Secondary | ICD-10-CM

## 2013-08-23 DIAGNOSIS — I4891 Unspecified atrial fibrillation: Secondary | ICD-10-CM

## 2013-08-23 NOTE — Progress Notes (Signed)
Patient ID: Tammy Sharp, female   DOB: 02-Feb-1925, 80 y.o.   MRN: [id number]    [facility]   No Known Allergies   Chief Complaint  Patient presents with  . Acute Visit    coumadin management     HPI:  She is on long term coumdin therapy for her afib and cva. Overall her status is stable. Her inr today is 2.2 and she is taking coumadin 4.5 mg daily. There are no concerns being voiced by the nursing staff today. She is not voicing any concerns today.   Past Medical History  Diagnosis Date  . Hypertension   . Diabetes mellitus without complication   . Stroke   . A-fib     Past Surgical History  Procedure Laterality Date  . Joint replacement    . Fracture surgery    . Orif femur decompression Right 2014    R mid femur fracture, between R hip and kneee prosthesis  . Orif femur fracture Right 06/17/2013    Procedure: OPEN REDUCTION INTERNAL FIXATION (ORIF) DISTAL FEMUR FRACTURE;  Surgeon: Sheral Apley, MD;  Location: [facility] OR;  Service: Orthopedics;  Laterality: Right;    VITAL SIGNS BP 126/68  Pulse 80  Ht 5\' 7"  (1.702 m)  Wt 185 lb (83.915 kg)  BMI 28.97 kg/m2   Patient's Medications  New Prescriptions   No medications on file  Previous Medications   ACETAMINOPHEN (TYLENOL) 325 MG TABLET    Take 650 mg by mouth every 6 (six) hours as needed for mild pain.   ATENOLOL (TENORMIN) 50 MG TABLET    Take 50 mg by mouth daily.   DOCUSATE SODIUM (COLACE) 100 MG CAPSULE    Take 100 mg by mouth 2 (two) times daily.   HYDROCODONE-ACETAMINOPHEN (NORCO/VICODIN) 5-325 MG PER TABLET    Take one tablet by mouth twice daily at 8am and 8pm for post surgery pain. Hold for sedation   INSULIN DETEMIR (LEVEMIR) 100 UNIT/ML INJECTION    Inject 15 Units into the skin 2 (two) times daily.   INSULIN LISPRO (HUMALOG) 100 UNIT/ML INJECTION    Inject 5 Units into the skin 3 (three) times daily before meals.   IRON POLYSACCHARIDES (NIFEREX) 150 MG CAPSULE    Take 150 mg by mouth daily.   LATANOPROST (XALATAN) 0.005 % OPHTHALMIC SOLUTION    Place 1 drop into both eyes at bedtime.   MAGNESIUM OXIDE (MAG-OX) 400 MG TABLET    Take 400 mg by mouth daily.   METHOCARBAMOL (ROBAXIN) 500 MG TABLET    Take 500 mg by mouth 3 (three) times daily as needed for muscle spasms.   NITROGLYCERIN (NITROSTAT) 0.4 MG SL TABLET    Place 0.4 mg under the tongue every 5 (five) minutes as needed for chest pain.   OMEPRAZOLE (PRILOSEC) 20 MG CAPSULE    Take 20 mg by mouth daily.   ONDANSETRON (ZOFRAN) 4 MG TABLET    Take 4 mg by mouth every 8 (eight) hours as needed for nausea or vomiting.   POLYETHYLENE GLYCOL (MIRALAX / GLYCOLAX) PACKET    Take 17 g by mouth daily as needed for moderate constipation or severe constipation.   WARFARIN (COUMADIN) 5 MG TABLET    Take 4.5 mg by mouth at bedtime.   Modified Medications   No medications on file  Discontinued Medications   No medications on file    SIGNIFICANT DIAGNOSTIC EXAMS  06-16-13: right knee x-ray: Lateral displacement of femoral fracture fixation hardware indicating  hardware failure, with protrusion of a surgical screw through the skin. Comminuted distal femoral fracture with 1 full shaft width overlap of the fracture fragments.  06-16-13: right femur x-ray: Prior right hip and knee replacements. Prior ORIF of a distal femoral diaphyseal fracture with displacement of the dominant distal femoral fragment from the plate. Inferior most screw extends has withdrawn from the plate and extends external to distal femoral soft tissues.  06-16-13: 2-d echo: Left ventricle: The cavity size was normal. Wall thickness was normal. Systolic function was normal. The estimated ejection fraction was in the range of 55% to 60%. Wall motion was normal; there were no regional wall motion abnormalities. - Mitral valve: Mild regurgitation. - Right atrium: The atrium was mildly dilated.  06-16-13: chest x-ray: No active disease.     LABS REVIEWED:   06-14-13: wbc  12.-; hgb 10.8; hct 35.7; mcv 102.9; plt 585; glucose 145; bun 16; creat 0.7; k+3.8; na++141 liver normal albumin 3.1  06-16-13: hgb a1c 5.5 06-18-13: wbc 9.2; hgb 9.8; hct 29.9; mcv 94.3; plt 387; glucose 157; bun 11; creat 0.53; k+4.4; na++136 06-25-13: wbc 5.6; hgb 9.1; hct 32.4 ;mcv 100.3; plt 412; glucose 129; bun 12; creat 0.5; k+3.6; na++144; liver normal albumin 2.8; mag 1.8  INR 1.5  07-12-13: mag 1.9  08-11-13: urine culture: e-coli: septra      Review of Systems  Constitutional: Negative for malaise/fatigue.  Eyes: Negative for blurred vision.  Respiratory: Negative for cough and shortness of breath.   Cardiovascular: Negative for chest pain, palpitations and leg swelling.  Gastrointestinal: Negative for heartburn, abdominal pain and constipation.  Musculoskeletal: Negative for joint pain and myalgias.  Skin: Negative.   Neurological: Negative for dizziness, weakness and headaches.  Psychiatric/Behavioral: Negative for depression. The patient is not nervous/anxious.      Physical Exam  Constitutional: She is oriented to person, place, and time. She appears well-developed and well-nourished. No distress.  Neck: Neck supple. No JVD present.  Cardiovascular: Normal rate, regular rhythm and intact distal pulses.   Respiratory: Effort normal and breath sounds normal. No respiratory distress. She has no wheezes.  GI: Soft. Bowel sounds are normal. She exhibits no distension. There is no tenderness.  Musculoskeletal: She exhibits no edema.  Is able to move all extremities; right leg with splint in place   Neurological: She is alert and oriented to person, place, and time.  Skin: Skin is warm and dry. She is not diaphoretic.     ASSESSMENT/ PLAN:  1. Afib/cva/anticoagulation management for her inr of 2.2 will continue her coumadin 4.5 mg daily and will check inr in one week. Will monitor her status.     Synthia Innocenteborah Green NP [facility]  Contact [phone] Monday  through Friday 8am- 5pm  After hours call [phone]

## 2013-08-23 NOTE — Progress Notes (Signed)
Patient ID: Tammy Sharp, female   DOB: 1924/09/22, 80 y.o.   MRN: [id number]     [facility]  No Known Allergies   Chief Complaint  Patient presents with  . Acute Visit    coumadin management     HPI:  She is on long term coumadin therapy for her afib with a history of cva. She is tolerating her coumadin therapy without difficulty there are no concerns being voiced by the nursing staff at this time. Her inr today is 2.8 her coumadin dose of 6 mg was held.   Past Medical History  Diagnosis Date  . Hypertension   . Diabetes mellitus without complication   . Stroke   . A-fib     Past Surgical History  Procedure Laterality Date  . Joint replacement    . Fracture surgery    . Orif femur decompression Right 2014    R mid femur fracture, between R hip and kneee prosthesis  . Orif femur fracture Right 06/17/2013    Procedure: OPEN REDUCTION INTERNAL FIXATION (ORIF) DISTAL FEMUR FRACTURE;  Surgeon: Sheral Apleyimothy D Murphy, MD;  Location: [facility] OR;  Service: Orthopedics;  Laterality: Right;    VITAL SIGNS BP 132/78  Pulse 70  Ht 5\' 7"  (1.702 m)  Wt 185 lb (83.915 kg)  BMI 28.97 kg/m2   Patient's Medications  New Prescriptions   No medications on file  Previous Medications   ACETAMINOPHEN (TYLENOL) 325 MG TABLET    Take 650 mg by mouth every 6 (six) hours as needed for mild pain.   ATENOLOL (TENORMIN) 50 MG TABLET    Take 50 mg by mouth daily.   DOCUSATE SODIUM (COLACE) 100 MG CAPSULE    Take 100 mg by mouth 2 (two) times daily.   HYDROCODONE-ACETAMINOPHEN (NORCO/VICODIN) 5-325 MG PER TABLET    Take one tablet by mouth twice daily at 8am and 8pm for post surgery pain. Hold for sedation   INSULIN DETEMIR (LEVEMIR) 100 UNIT/ML INJECTION    Inject 15 Units into the skin 2 (two) times daily.   INSULIN LISPRO (HUMALOG) 100 UNIT/ML INJECTION    Inject 5 Units into the skin 3 (three) times daily before meals.   IRON POLYSACCHARIDES (NIFEREX) 150 MG CAPSULE    Take 150 mg by mouth  daily.   LATANOPROST (XALATAN) 0.005 % OPHTHALMIC SOLUTION    Place 1 drop into both eyes at bedtime.   MAGNESIUM OXIDE (MAG-OX) 400 MG TABLET    Take 400 mg by mouth daily.   METHOCARBAMOL (ROBAXIN) 500 MG TABLET    Take 500 mg by mouth 3 (three) times daily as needed for muscle spasms.   NITROGLYCERIN (NITROSTAT) 0.4 MG SL TABLET    Place 0.4 mg under the tongue every 5 (five) minutes as needed for chest pain.   OMEPRAZOLE (PRILOSEC) 20 MG CAPSULE    Take 20 mg by mouth daily.   ONDANSETRON (ZOFRAN) 4 MG TABLET    Take 4 mg by mouth every 8 (eight) hours as needed for nausea or vomiting.   POLYETHYLENE GLYCOL (MIRALAX / GLYCOLAX) PACKET    Take 17 g by mouth daily as needed for moderate constipation or severe constipation.   WARFARIN (COUMADIN) 5 MG TABLET    Take 6 mg by mouth at bedtime.   Modified Medications   No medications on file  Discontinued Medications   No medications on file    SIGNIFICANT DIAGNOSTIC EXAMS  06-16-13: right knee x-ray: Lateral displacement of femoral fracture fixation  hardware indicating hardware failure, with protrusion of a surgical screw through the skin. Comminuted distal femoral fracture with 1 full shaft width overlap of the fracture fragments.  06-16-13: right femur x-ray: Prior right hip and knee replacements. Prior ORIF of a distal femoral diaphyseal fracture with displacement of the dominant distal femoral fragment from the plate. Inferior most screw extends has withdrawn from the plate and extends external to distal femoral soft tissues.  06-16-13: 2-d echo: Left ventricle: The cavity size was normal. Wall thickness was normal. Systolic function was normal. The estimated ejection fraction was in the range of 55% to 60%. Wall motion was normal; there were no regional wall motion abnormalities. - Mitral valve: Mild regurgitation. - Right atrium: The atrium was mildly dilated.  06-16-13: chest x-ray: No active disease.     LABS REVIEWED:    06-14-13: wbc 12.-; hgb 10.8; hct 35.7; mcv 102.9; plt 585; glucose 145; bun 16; creat 0.7; k+3.8; na++141 liver normal albumin 3.1  06-16-13: hgb a1c 5.5 06-18-13: wbc 9.2; hgb 9.8; hct 29.9; mcv 94.3; plt 387; glucose 157; bun 11; creat 0.53; k+4.4; na++136 06-25-13: wbc 5.6; hgb 9.1; hct 32.4 ;mcv 100.3; plt 412; glucose 129; bun 12; creat 0.5; k+3.6; na++144; liver normal albumin 2.8; mag 1.8  INR 1.5  07-12-13: mag 1.9  08-11-13: urine culture: e-coli: septra      Review of Systems  Constitutional: Negative for malaise/fatigue.  Eyes: Negative for blurred vision.  Respiratory: Negative for cough and shortness of breath.   Cardiovascular: Negative for chest pain, palpitations and leg swelling.  Gastrointestinal: Negative for heartburn, abdominal pain and constipation.  Musculoskeletal: Negative for joint pain and myalgias.  Skin: Negative.   Neurological: Negative for dizziness, weakness and headaches.  Psychiatric/Behavioral: Negative for depression. The patient is not nervous/anxious.      Physical Exam  Constitutional: She is oriented to person, place, and time. She appears well-developed and well-nourished. No distress.  Neck: Neck supple. No JVD present.  Cardiovascular: Normal rate, regular rhythm and intact distal pulses.   Respiratory: Effort normal and breath sounds normal. No respiratory distress. She has no wheezes.  GI: Soft. Bowel sounds are normal. She exhibits no distension. There is no tenderness.  Musculoskeletal: She exhibits no edema.  Is able to move all extremities; right leg with splint in place   Neurological: She is alert and oriented to person, place, and time.  Skin: Skin is warm and dry. She is not diaphoretic.     ASSESSMENT/ PLAN:  1. Afib/cva/anticoagulation management: for her inr of 2.8 will begin coumadin 4.5 daily and will check inr on Monday and will monitor her status.      Synthia Innocent NP [facility]  Contact  [phone] Monday through Friday 8am- 5pm  After hours call [phone]

## 2013-08-30 ENCOUNTER — Non-Acute Institutional Stay (SKILLED_NURSING_FACILITY): Payer: Medicare Other | Admitting: Adult Health

## 2013-08-30 DIAGNOSIS — Z7901 Long term (current) use of anticoagulants: Secondary | ICD-10-CM

## 2013-08-30 DIAGNOSIS — I4891 Unspecified atrial fibrillation: Secondary | ICD-10-CM

## 2013-08-30 DIAGNOSIS — I639 Cerebral infarction, unspecified: Secondary | ICD-10-CM

## 2013-08-30 DIAGNOSIS — I6350 Cerebral infarction due to unspecified occlusion or stenosis of unspecified cerebral artery: Secondary | ICD-10-CM

## 2013-09-02 ENCOUNTER — Encounter: Payer: Self-pay | Admitting: Adult Health

## 2013-09-02 NOTE — Progress Notes (Signed)
Patient ID: Tammy Sharp, female   DOB: 18-May-1925, 80 y.o.   MRN: [id number]     [facility]  No Known Allergies   Chief Complaint  Patient presents with  . Acute Visit    coumadin management     HPI:  She is on long term coumadin therapy for her afib and old cva. Her heart rate is under control and she is neurologically stable. Her inr today is 3.0 and she is taking coumadin 4.5 mg daily. There are no concerns being voiced by the nursing staff at this time.    Past Medical History  Diagnosis Date  . Hypertension   . Diabetes mellitus without complication   . Stroke   . A-fib     Past Surgical History  Procedure Laterality Date  . Joint replacement    . Fracture surgery    . Orif femur decompression Right 2014    R mid femur fracture, between R hip and kneee prosthesis  . Orif femur fracture Right 06/17/2013    Procedure: OPEN REDUCTION INTERNAL FIXATION (ORIF) DISTAL FEMUR FRACTURE;  Surgeon: Sheral Apleyimothy D Murphy, MD;  Location: [facility] OR;  Service: Orthopedics;  Laterality: Right;    VITAL SIGNS BP 138/70  Pulse 95  Ht 5\' 7"  (1.702 m)  Wt 188 lb (85.276 kg)  BMI 29.44 kg/m2   Patient's Medications  New Prescriptions   No medications on file  Previous Medications   ACETAMINOPHEN (TYLENOL) 325 MG TABLET    Take 650 mg by mouth every 6 (six) hours as needed for mild pain.   ATENOLOL (TENORMIN) 50 MG TABLET    Take 50 mg by mouth daily.   DOCUSATE SODIUM (COLACE) 100 MG CAPSULE    Take 100 mg by mouth 2 (two) times daily.   HYDROCODONE-ACETAMINOPHEN (NORCO/VICODIN) 5-325 MG PER TABLET    Take one tablet by mouth twice daily at 8am and 8pm for post surgery pain. Hold for sedation   INSULIN DETEMIR (LEVEMIR) 100 UNIT/ML INJECTION    Inject 15 Units into the skin 2 (two) times daily.   INSULIN LISPRO (HUMALOG) 100 UNIT/ML INJECTION    Inject 5 Units into the skin 3 (three) times daily before meals.   IRON POLYSACCHARIDES (NIFEREX) 150 MG CAPSULE    Take 150 mg by  mouth daily.   LATANOPROST (XALATAN) 0.005 % OPHTHALMIC SOLUTION    Place 1 drop into both eyes at bedtime.   MAGNESIUM OXIDE (MAG-OX) 400 MG TABLET    Take 400 mg by mouth daily.   METHOCARBAMOL (ROBAXIN) 500 MG TABLET    Take 500 mg by mouth 3 (three) times daily as needed for muscle spasms.   NITROGLYCERIN (NITROSTAT) 0.4 MG SL TABLET    Place 0.4 mg under the tongue every 5 (five) minutes as needed for chest pain.   OMEPRAZOLE (PRILOSEC) 20 MG CAPSULE    Take 20 mg by mouth daily.   ONDANSETRON (ZOFRAN) 4 MG TABLET    Take 4 mg by mouth every 8 (eight) hours as needed for nausea or vomiting.   POLYETHYLENE GLYCOL (MIRALAX / GLYCOLAX) PACKET    Take 17 g by mouth daily as needed for moderate constipation or severe constipation.   WARFARIN (COUMADIN) 5 MG TABLET    Take 4.5 mg by mouth at bedtime.   Modified Medications   No medications on file  Discontinued Medications   No medications on file    SIGNIFICANT DIAGNOSTIC EXAMS  06-16-13: right knee x-ray: Lateral displacement of femoral  fracture fixation hardware indicating hardware failure, with protrusion of a surgical screw through the skin. Comminuted distal femoral fracture with 1 full shaft width overlap of the fracture fragments.  06-16-13: right femur x-ray: Prior right hip and knee replacements. Prior ORIF of a distal femoral diaphyseal fracture with displacement of the dominant distal femoral fragment from the plate. Inferior most screw extends has withdrawn from the plate and extends external to distal femoral soft tissues.  06-16-13: 2-d echo: Left ventricle: The cavity size was normal. Wall thickness was normal. Systolic function was normal. The estimated ejection fraction was in the range of 55% to 60%. Wall motion was normal; there were no regional wall motion abnormalities. - Mitral valve: Mild regurgitation. - Right atrium: The atrium was mildly dilated.  06-16-13: chest x-ray: No active disease.     LABS REVIEWED:    06-14-13: wbc 12.-; hgb 10.8; hct 35.7; mcv 102.9; plt 585; glucose 145; bun 16; creat 0.7; k+3.8; na++141 liver normal albumin 3.1  06-16-13: hgb a1c 5.5 06-18-13: wbc 9.2; hgb 9.8; hct 29.9; mcv 94.3; plt 387; glucose 157; bun 11; creat 0.53; k+4.4; na++136 06-25-13: wbc 5.6; hgb 9.1; hct 32.4 ;mcv 100.3; plt 412; glucose 129; bun 12; creat 0.5; k+3.6; na++144; liver normal albumin 2.8; mag 1.8  INR 1.5  07-12-13: mag 1.9  08-11-13: urine culture: e-coli: septra      Review of Systems  Constitutional: Negative for malaise/fatigue.  Eyes: Negative for blurred vision.  Respiratory: Negative for cough and shortness of breath.   Cardiovascular: Negative for chest pain, palpitations and leg swelling.  Gastrointestinal: Negative for heartburn, abdominal pain and constipation.  Musculoskeletal: Negative for joint pain and myalgias.  Skin: Negative.   Neurological: Negative for dizziness, weakness and headaches.  Psychiatric/Behavioral: Negative for depression. The patient is not nervous/anxious.      Physical Exam  Constitutional: She is oriented to person, place, and time. She appears well-developed and well-nourished. No distress.  Neck: Neck supple. No JVD present.  Cardiovascular: Normal rate, regular rhythm and intact distal pulses.   Respiratory: Effort normal and breath sounds normal. No respiratory distress. She has no wheezes.  GI: Soft. Bowel sounds are normal. She exhibits no distension. There is no tenderness.  Musculoskeletal: She exhibits no edema.  Is able to move all extremities; right leg with splint in place   Neurological: She is alert and oriented to person, place, and time.  Skin: Skin is warm and dry. She is not diaphoretic.     ASSESSMENT/ PLAN:  1. Afib/cva/anticoagulation management: for her inr of 3.0 will being coumadin 4 mg daily and will check inr in one week and will monitor her status.     Synthia Innocent NP [facility]  Contact  [phone] Monday through Friday 8am- 5pm  After hours call [phone]

## 2013-09-10 ENCOUNTER — Non-Acute Institutional Stay (SKILLED_NURSING_FACILITY): Payer: Medicare Other | Admitting: Adult Health

## 2013-09-10 DIAGNOSIS — Z7901 Long term (current) use of anticoagulants: Secondary | ICD-10-CM

## 2013-09-10 DIAGNOSIS — IMO0001 Reserved for inherently not codable concepts without codable children: Secondary | ICD-10-CM

## 2013-09-10 DIAGNOSIS — S72301A Unspecified fracture of shaft of right femur, initial encounter for closed fracture: Secondary | ICD-10-CM

## 2013-09-10 DIAGNOSIS — D62 Acute posthemorrhagic anemia: Secondary | ICD-10-CM

## 2013-09-10 DIAGNOSIS — K219 Gastro-esophageal reflux disease without esophagitis: Secondary | ICD-10-CM

## 2013-09-10 DIAGNOSIS — I4891 Unspecified atrial fibrillation: Secondary | ICD-10-CM

## 2013-09-10 DIAGNOSIS — K5900 Constipation, unspecified: Secondary | ICD-10-CM

## 2013-09-10 DIAGNOSIS — E1165 Type 2 diabetes mellitus with hyperglycemia: Secondary | ICD-10-CM

## 2013-09-10 DIAGNOSIS — S72309A Unspecified fracture of shaft of unspecified femur, initial encounter for closed fracture: Secondary | ICD-10-CM

## 2013-09-10 DIAGNOSIS — I10 Essential (primary) hypertension: Secondary | ICD-10-CM

## 2013-09-13 ENCOUNTER — Non-Acute Institutional Stay (SKILLED_NURSING_FACILITY): Payer: Medicare Other | Admitting: Adult Health

## 2013-09-13 DIAGNOSIS — I6350 Cerebral infarction due to unspecified occlusion or stenosis of unspecified cerebral artery: Secondary | ICD-10-CM

## 2013-09-13 DIAGNOSIS — I4891 Unspecified atrial fibrillation: Secondary | ICD-10-CM

## 2013-09-13 DIAGNOSIS — I639 Cerebral infarction, unspecified: Secondary | ICD-10-CM

## 2013-09-13 DIAGNOSIS — Z7901 Long term (current) use of anticoagulants: Secondary | ICD-10-CM

## 2013-09-13 LAB — BASIC METABOLIC PANEL
BUN: 19 mg/dL (ref 4–21)
CREATININE: 0.8 mg/dL (ref 0.5–1.1)
Glucose: 83 mg/dL
Potassium: 4.3 mmol/L (ref 3.4–5.3)
Sodium: 143 mmol/L (ref 137–147)

## 2013-09-13 LAB — CBC AND DIFFERENTIAL
HEMATOCRIT: 41 % (ref 36–46)
Hemoglobin: 12.2 g/dL (ref 12.0–16.0)
PLATELETS: 223 10*3/uL (ref 150–399)
WBC: 4.5 10^3/mL

## 2013-09-13 LAB — HEMOGLOBIN A1C: Hgb A1c MFr Bld: 6.1 % — AB (ref 4.0–6.0)

## 2013-09-16 ENCOUNTER — Encounter: Payer: Self-pay | Admitting: Adult Health

## 2013-09-16 ENCOUNTER — Other Ambulatory Visit: Payer: Self-pay | Admitting: *Deleted

## 2013-09-16 MED ORDER — HYDROCODONE-ACETAMINOPHEN 5-325 MG PO TABS: ORAL_TABLET | ORAL | Status: AC

## 2013-09-16 NOTE — Progress Notes (Signed)
Patient ID: Tammy Sharp, female   DOB: Feb 10, 1925, 80 y.o.   MRN: [id number]     [facility]  No Known Allergies   Chief Complaint  Patient presents with  . Medical Managment of Chronic Issues    HPI:  She is being seen for the management of her chronic illnesses. Overall her status is doing well. She is able to participate more in therapy as her weight bearing improves. There are no concerns being voiced by the nursing staff at this time. She is not voicing any concerns at this time.   Past Medical History  Diagnosis Date  . Hypertension   . Diabetes mellitus without complication   . Stroke   . A-fib     Past Surgical History  Procedure Laterality Date  . Joint replacement    . Fracture surgery    . Orif femur decompression Right 2014    R mid femur fracture, between R hip and kneee prosthesis  . Orif femur fracture Right 06/17/2013    Procedure: OPEN REDUCTION INTERNAL FIXATION (ORIF) DISTAL FEMUR FRACTURE;  Surgeon: Sheral Apley, MD;  Location: [facility] OR;  Service: Orthopedics;  Laterality: Right;    VITAL SIGNS BP 119/87  Pulse 79  Ht 5\' 7"  (1.702 m)  Wt 188 lb (85.276 kg)  BMI 29.44 kg/m2   Patient's Medications  New Prescriptions   No medications on file  Previous Medications   ACETAMINOPHEN (TYLENOL) 325 MG TABLET    Take 650 mg by mouth every 6 (six) hours as needed for mild pain.   ATENOLOL (TENORMIN) 50 MG TABLET    Take 50 mg by mouth daily.   DOCUSATE SODIUM (COLACE) 100 MG CAPSULE    Take 100 mg by mouth 2 (two) times daily.   HYDROCODONE-ACETAMINOPHEN (NORCO/VICODIN) 5-325 MG PER TABLET    Take one tablet by mouth twice daily at 8am and 8pm for post surgery pain. Hold for sedation   INSULIN DETEMIR (LEVEMIR) 100 UNIT/ML INJECTION    Inject 15 Units into the skin 2 (two) times daily.   INSULIN LISPRO (HUMALOG) 100 UNIT/ML INJECTION    Inject 5 Units into the skin 3 (three) times daily before meals.   IRON POLYSACCHARIDES (NIFEREX) 150 MG  CAPSULE    Take 150 mg by mouth daily.   LATANOPROST (XALATAN) 0.005 % OPHTHALMIC SOLUTION    Place 1 drop into both eyes at bedtime.   MAGNESIUM OXIDE (MAG-OX) 400 MG TABLET    Take 400 mg by mouth daily.   METHOCARBAMOL (ROBAXIN) 500 MG TABLET    Take 500 mg by mouth 3 (three) times daily as needed for muscle spasms.   NITROGLYCERIN (NITROSTAT) 0.4 MG SL TABLET    Place 0.4 mg under the tongue every 5 (five) minutes as needed for chest pain.   OMEPRAZOLE (PRILOSEC) 20 MG CAPSULE    Take 20 mg by mouth daily.   ONDANSETRON (ZOFRAN) 4 MG TABLET    Take 4 mg by mouth every 8 (eight) hours as needed for nausea or vomiting.   POLYETHYLENE GLYCOL (MIRALAX / GLYCOLAX) PACKET    Take 17 g by mouth daily as needed for moderate constipation or severe constipation.   WARFARIN (COUMADIN) 5 MG TABLET    Take 4.5 mg by mouth at bedtime.   Modified Medications   No medications on file  Discontinued Medications   No medications on file    SIGNIFICANT DIAGNOSTIC EXAMS  06-16-13: right knee x-ray: Lateral displacement of femoral fracture fixation  hardware indicating hardware failure, with protrusion of a surgical screw through the skin. Comminuted distal femoral fracture with 1 full shaft width overlap of the fracture fragments.  06-16-13: right femur x-ray: Prior right hip and knee replacements. Prior ORIF of a distal femoral diaphyseal fracture with displacement of the dominant distal femoral fragment from the plate. Inferior most screw extends has withdrawn from the plate and extends external to distal femoral soft tissues.  06-16-13: 2-d echo: Left ventricle: The cavity size was normal. Wall thickness was normal. Systolic function was normal. The estimated ejection fraction was in the range of 55% to 60%. Wall motion was normal; there were no regional wall motion abnormalities. - Mitral valve: Mild regurgitation. - Right atrium: The atrium was mildly dilated.  06-16-13: chest x-ray: No active  disease.     LABS REVIEWED:   06-14-13: wbc 12.-; hgb 10.8; hct 35.7; mcv 102.9; plt 585; glucose 145; bun 16; creat 0.7; k+3.8; na++141 liver normal albumin 3.1  06-16-13: hgb a1c 5.5 06-18-13: wbc 9.2; hgb 9.8; hct 29.9; mcv 94.3; plt 387; glucose 157; bun 11; creat 0.53; k+4.4; na++136 06-25-13: wbc 5.6; hgb 9.1; hct 32.4 ;mcv 100.3; plt 412; glucose 129; bun 12; creat 0.5; k+3.6; na++144; liver normal albumin 2.8; mag 1.8  INR 1.5  07-12-13: mag 1.9  08-11-13: urine culture: e-coli: septra      Review of Systems  Constitutional: Negative for malaise/fatigue.  Eyes: Negative for blurred vision.  Respiratory: Negative for cough and shortness of breath.   Cardiovascular: Negative for chest pain, palpitations and leg swelling.  Gastrointestinal: Negative for heartburn, abdominal pain and constipation.  Musculoskeletal: Negative for joint pain and myalgias.  Skin: Negative.   Neurological: Negative for dizziness, weakness and headaches.  Psychiatric/Behavioral: Negative for depression. The patient is not nervous/anxious.      Physical Exam  Constitutional: She is oriented to person, place, and time. She appears well-developed and well-nourished. No distress.  Neck: Neck supple. No JVD present.  Cardiovascular: Normal rate, regular rhythm and intact distal pulses.   Respiratory: Effort normal and breath sounds normal. No respiratory distress. She has no wheezes.  GI: Soft. Bowel sounds are normal. She exhibits no distension. There is no tenderness.  Musculoskeletal: She exhibits no edema.  Is able to move all extremities; right leg with splint in place   Neurological: She is alert and oriented to person, place, and time.  Skin: Skin is warm and dry. She is not diaphoretic.     ASSESSMENT/ PLAN:   1. Afib/inr management: will continue coumadin 4.5 mg for her inr of 2.1. Her heart rate is under control at this time.   2. Hypertension: is stable will continue tenormin 50 mg  daily and will monitor  3. Right femur fracture: continue with therapy as directed; will continue percocet 5/325 mg twice daily and will continue robaxin 500 mg three times daily las needed and will continue to monitor her status.  4. Anemia: is stable will continue nu-iron daily   5. Diabetes: her readings are variable; will continue levemir 15 units twice daily; will change her humalog to 5 units at breakfast and lunch and 7 units at supper and will monitor her status   6. Genella RifeGerd: will continue prilosec 20 mg daily   7. Constipation: will continue colace twice daily and miralax daily as needed  8. Glaucoma: will continue xalatan to both eyes nightly       Synthia Innocenteborah Green NP [facility]  Contact [phone] Monday through  Friday 8am- 5pm  After hours call [phone]

## 2013-09-17 ENCOUNTER — Non-Acute Institutional Stay (SKILLED_NURSING_FACILITY): Payer: Medicare Other | Admitting: Adult Health

## 2013-09-17 DIAGNOSIS — I10 Essential (primary) hypertension: Secondary | ICD-10-CM

## 2013-09-17 DIAGNOSIS — K5900 Constipation, unspecified: Secondary | ICD-10-CM

## 2013-09-17 DIAGNOSIS — E1165 Type 2 diabetes mellitus with hyperglycemia: Secondary | ICD-10-CM

## 2013-09-17 DIAGNOSIS — S72301A Unspecified fracture of shaft of right femur, initial encounter for closed fracture: Secondary | ICD-10-CM

## 2013-09-17 DIAGNOSIS — IMO0001 Reserved for inherently not codable concepts without codable children: Secondary | ICD-10-CM

## 2013-09-17 DIAGNOSIS — K219 Gastro-esophageal reflux disease without esophagitis: Secondary | ICD-10-CM

## 2013-09-17 DIAGNOSIS — I4891 Unspecified atrial fibrillation: Secondary | ICD-10-CM

## 2013-09-17 DIAGNOSIS — S72309A Unspecified fracture of shaft of unspecified femur, initial encounter for closed fracture: Secondary | ICD-10-CM

## 2013-09-17 DIAGNOSIS — D62 Acute posthemorrhagic anemia: Secondary | ICD-10-CM

## 2013-09-18 ENCOUNTER — Encounter: Payer: Self-pay | Admitting: Adult Health

## 2013-09-18 NOTE — Progress Notes (Signed)
Patient ID: Tammy Sharp, female   DOB: 03/10/1925, 80 y.o.   MRN: [id number]     [facility]  No Known Allergies   Chief Complaint  Patient presents with  . Acute Visit    coumadin management     HPI:  She is on long term coumadin therapy for her afib and cva. She is presently stable. She is tolerating coumadin without difficulty. Her inr today is 1.6 and she is taking coumadin 4.5 mg daily    Past Medical History  Diagnosis Date  . Hypertension   . Diabetes mellitus without complication   . Stroke   . A-fib   . 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 Joint replacement    . Fracture surgery    . Orif femur decompression Right 2014    R mid femur fracture, between R hip and kneee prosthesis  . Orif femur fracture Right 06/17/2013    Procedure: OPEN REDUCTION INTERNAL FIXATION (ORIF) DISTAL FEMUR FRACTURE;  Surgeon: Sheral Apley, MD;  Location: [facility] OR;  Service: Orthopedics;  Laterality: Right;    VITAL SIGNS BP 128/76  Pulse 68  Ht 5\' 7"  (1.702 m)  Wt 191 lb 12.8 oz (87 kg)  BMI 30.03 kg/m2   Patient's Medications  New Prescriptions   No medications on file  Previous Medications   ACETAMINOPHEN (TYLENOL) 325 MG TABLET    Take 650 mg by mouth every 6 (six) hours as needed for mild pain.   ATENOLOL (TENORMIN) 50 MG TABLET    Take 50 mg by mouth daily.   DOCUSATE SODIUM (COLACE) 100 MG CAPSULE    Take 100 mg by mouth 2 (two) times daily.   HYDROCODONE-ACETAMINOPHEN (NORCO/VICODIN) 5-325 MG PER TABLET    Take one tablet by mouth twice daily at 8am and 8pm for post surgery pain. Hold for sedation   INSULIN DETEMIR (LEVEMIR) 100 UNIT/ML INJECTION    Inject 15 Units into the skin 2 (two) times daily.   INSULIN LISPRO (HUMALOG) 100 UNIT/ML INJECTION    Inject 5 Units into the skin 3 (three) times daily before meals.   IRON POLYSACCHARIDES (NIFEREX) 150 MG CAPSULE    Take 150 mg by mouth daily.   LATANOPROST (XALATAN) 0.005 % OPHTHALMIC SOLUTION    Place 1 drop into both eyes at bedtime.   MAGNESIUM OXIDE (MAG-OX) 400 MG TABLET    Take 400 mg by mouth daily.   METHOCARBAMOL (ROBAXIN) 500 MG TABLET    Take 500 mg by mouth 3 (three) times daily as needed for muscle spasms.   NITROGLYCERIN (NITROSTAT) 0.4 MG SL TABLET    Place 0.4 mg under the tongue every 5 (five) minutes as needed for chest pain.   OMEPRAZOLE (PRILOSEC) 20 MG CAPSULE    Take 20 mg by mouth daily.   ONDANSETRON (ZOFRAN) 4 MG TABLET    Take 4 mg by mouth every 8 (eight) hours as needed for nausea or vomiting.   POLYETHYLENE GLYCOL (MIRALAX / GLYCOLAX) PACKET    Take 17 g by mouth daily as needed for moderate constipation or severe constipation.   WARFARIN (COUMADIN) 5 MG TABLET    Take 4.5 mg by mouth at bedtime.   Modified Medications   No medications on file  Discontinued Medications   No medications on file    SIGNIFICANT DIAGNOSTIC EXAMS  06-16-13: right knee x-ray: Lateral displacement of femoral fracture fixation hardware indicating hardware failure, with  protrusion of a surgical screw through the skin. Comminuted distal femoral fracture with 1 full shaft width overlap of the fracture fragments.  06-16-13: right femur x-ray: Prior right hip and knee replacements. Prior ORIF of a distal femoral diaphyseal fracture with displacement of the dominant distal femoral fragment from the plate. Inferior most screw extends has withdrawn from the plate and extends external to distal femoral soft tissues.  06-16-13: 2-d echo: Left ventricle: The cavity size was normal. Wall thickness was normal. Systolic function was normal. The estimated ejection fraction was in the range of 55% to 60%. Wall motion was normal; there were no regional wall motion abnormalities. - Mitral valve: Mild regurgitation. - Right atrium: The atrium was mildly dilated.  06-16-13: chest x-ray: No active disease.     LABS REVIEWED:   06-14-13: wbc  12.-; hgb 10.8; hct 35.7; mcv 102.9; plt 585; glucose 145; bun 16; creat 0.7; k+3.8; na++141 liver normal albumin 3.1  06-16-13: hgb a1c 5.5 06-18-13: wbc 9.2; hgb 9.8; hct 29.9; mcv 94.3; plt 387; glucose 157; bun 11; creat 0.53; k+4.4; na++136 06-25-13: wbc 5.6; hgb 9.1; hct 32.4 ;mcv 100.3; plt 412; glucose 129; bun 12; creat 0.5; k+3.6; na++144; liver normal albumin 2.8; mag 1.8  INR 1.5  07-12-13: mag 1.9  08-11-13: urine culture: e-coli: septra      Review of Systems  Constitutional: Negative for malaise/fatigue.  Eyes: Negative for blurred vision.  Respiratory: Negative for cough and shortness of breath.   Cardiovascular: Negative for chest pain, palpitations and leg swelling.  Gastrointestinal: Negative for heartburn, abdominal pain and constipation.  Musculoskeletal: Negative for joint pain and myalgias.  Skin: Negative.   Neurological: Negative for dizziness, weakness and headaches.  Psychiatric/Behavioral: Negative for depression. The patient is not nervous/anxious.      Physical Exam  Constitutional: She is oriented to person, place, and time. She appears well-developed and well-nourished. No distress.  Neck: Neck supple. No JVD present.  Cardiovascular: Normal rate, regular rhythm and intact distal pulses.   Respiratory: Effort normal and breath sounds normal. No respiratory distress. She has no wheezes.  GI: Soft. Bowel sounds are normal. She exhibits no distension. There is no tenderness.  Musculoskeletal: She exhibits no edema.  Is able to move all extremities;   Neurological: She is alert and oriented to person, place, and time.  Skin: Skin is warm and dry. She is not diaphoretic.     ASSESSMENT/ PLAN:  1. Afib/cva/anticoagulation management: for her inr of 1.6; will increase her coumadin to 5 mg daily and will check inr on Thursday and will monitor        Synthia Innocenteborah Brick Ketcher NP [facility]  Contact [phone] Monday through Friday 8am- 5pm  After  hours call [phone]

## 2013-09-20 ENCOUNTER — Encounter: Payer: Self-pay | Admitting: Adult Health

## 2013-09-20 ENCOUNTER — Non-Acute Institutional Stay (SKILLED_NURSING_FACILITY): Payer: Medicare Other | Admitting: Adult Health

## 2013-09-20 DIAGNOSIS — Z7901 Long term (current) use of anticoagulants: Secondary | ICD-10-CM

## 2013-09-20 DIAGNOSIS — I639 Cerebral infarction, unspecified: Secondary | ICD-10-CM

## 2013-09-20 DIAGNOSIS — I4891 Unspecified atrial fibrillation: Secondary | ICD-10-CM

## 2013-09-20 DIAGNOSIS — I6350 Cerebral infarction due to unspecified occlusion or stenosis of unspecified cerebral artery: Secondary | ICD-10-CM

## 2013-09-20 NOTE — Progress Notes (Signed)
Patient ID: Tammy Sharp, female   DOB: Nov 11, 1924, 80 y.o.   MRN: [id number]     [facility]  No Known Allergies   Chief Complaint  Patient presents with  . Medical Managment of Chronic Issues    HPI:  She is being seen for the management of her chronic illnesses. Her brace has been discontinued and she continues to participate with therapy. There are no concerns being voiced by the nursing staff at this time. She is not voicing any complaints or concerns at this time.    Past Medical History  Diagnosis Date  . Hypertension   . Diabetes mellitus without complication   . Stroke   . A-fib   . 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 Joint replacement    . Fracture surgery    . Orif femur decompression Right 2014    R mid femur fracture, between R hip and kneee prosthesis  . Orif femur fracture Right 06/17/2013    Procedure: OPEN REDUCTION INTERNAL FIXATION (ORIF) DISTAL FEMUR FRACTURE;  Surgeon: Sheral Apleyimothy D Murphy, MD;  Location: [facility] OR;  Service: Orthopedics;  Laterality: Right;    VITAL SIGNS BP 135/75  Pulse 82  Ht 5\' 7"  (1.702 m)  Wt 191 lb 12.8 oz (87 kg)  BMI 30.03 kg/m2   Patient's Medications  New Prescriptions   No medications on file  Previous Medications   ACETAMINOPHEN (TYLENOL) 325 MG TABLET    Take 650 mg by mouth every 6 (six) hours as needed for mild pain.   ATENOLOL (TENORMIN) 50 MG TABLET    Take 50 mg by mouth daily.   DOCUSATE SODIUM (COLACE) 100 MG CAPSULE    Take 100 mg by mouth 2 (two) times daily.   HYDROCODONE-ACETAMINOPHEN (NORCO/VICODIN) 5-325 MG PER TABLET    Take one tablet by mouth twice daily at 8am and 8pm for post surgery pain. Hold for sedation   INSULIN DETEMIR (LEVEMIR) 100 UNIT/ML INJECTION    Inject 15 Units into the skin 2 (two) times daily.   INSULIN LISPRO (HUMALOG) 100 UNIT/ML INJECTION    Inject 5 Units into the skin 3 (three) times daily before meals.   IRON  POLYSACCHARIDES (NIFEREX) 150 MG CAPSULE    Take 150 mg by mouth daily.   LATANOPROST (XALATAN) 0.005 % OPHTHALMIC SOLUTION    Place 1 drop into both eyes at bedtime.   MAGNESIUM OXIDE (MAG-OX) 400 MG TABLET    Take 400 mg by mouth daily.   METHOCARBAMOL (ROBAXIN) 500 MG TABLET    Take 500 mg by mouth 3 (three) times daily as needed for muscle spasms.   NITROGLYCERIN (NITROSTAT) 0.4 MG SL TABLET    Place 0.4 mg under the tongue every 5 (five) minutes as needed for chest pain.   OMEPRAZOLE (PRILOSEC) 20 MG CAPSULE    Take 20 mg by mouth daily.   ONDANSETRON (ZOFRAN) 4 MG TABLET    Take 4 mg by mouth every 8 (eight) hours as needed for nausea or vomiting.   POLYETHYLENE GLYCOL (MIRALAX / GLYCOLAX) PACKET    Take 17 g by mouth daily as needed for moderate constipation or severe constipation.   WARFARIN (COUMADIN) 5 MG TABLET    Take 4.5 mg by mouth at bedtime.   Modified Medications   No medications on file  Discontinued Medications   No medications on file    SIGNIFICANT DIAGNOSTIC EXAMS  06-16-13: right knee x-ray: Lateral  displacement of femoral fracture fixation hardware indicating hardware failure, with protrusion of a surgical screw through the skin. Comminuted distal femoral fracture with 1 full shaft width overlap of the fracture fragments.  06-16-13: right femur x-ray: Prior right hip and knee replacements. Prior ORIF of a distal femoral diaphyseal fracture with displacement of the dominant distal femoral fragment from the plate. Inferior most screw extends has withdrawn from the plate and extends external to distal femoral soft tissues.  06-16-13: 2-d echo: Left ventricle: The cavity size was normal. Wall thickness was normal. Systolic function was normal. The estimated ejection fraction was in the range of 55% to 60%. Wall motion was normal; there were no regional wall motion abnormalities. - Mitral valve: Mild regurgitation. - Right atrium: The atrium was mildly dilated.  06-16-13:  chest x-ray: No active disease.     LABS REVIEWED:   06-14-13: wbc 12.-; hgb 10.8; hct 35.7; mcv 102.9; plt 585; glucose 145; bun 16; creat 0.7; k+3.8; na++141 liver normal albumin 3.1  06-16-13: hgb a1c 5.5 06-18-13: wbc 9.2; hgb 9.8; hct 29.9; mcv 94.3; plt 387; glucose 157; bun 11; creat 0.53; k+4.4; na++136 06-25-13: wbc 5.6; hgb 9.1; hct 32.4 ;mcv 100.3; plt 412; glucose 129; bun 12; creat 0.5; k+3.6; na++144; liver normal albumin 2.8; mag 1.8  INR 1.5  07-12-13: mag 1.9  08-11-13: urine culture: e-coli: septra  09-13-13: wbc 4.5; hgb 12.2; hct 41.0; mcv 95.6.; plt 223; glucose 83; bun 19; creat 0.8; k+4.3; na++143; liver normal albumin 3.1; hgb a1c 6.1      Review of Systems  Constitutional: Negative for malaise/fatigue.  Eyes: Negative for blurred vision.  Respiratory: Negative for cough and shortness of breath.   Cardiovascular: Negative for chest pain, palpitations and leg swelling.  Gastrointestinal: Negative for heartburn, abdominal pain and constipation.  Musculoskeletal: Negative for joint pain and myalgias.  Skin: Negative.   Neurological: Negative for dizziness, weakness and headaches.  Psychiatric/Behavioral: Negative for depression. The patient is not nervous/anxious.      Physical Exam  Constitutional: She is oriented to person, place, and time. She appears well-developed and well-nourished. No distress.  Neck: Neck supple. No JVD present.  Cardiovascular: Normal rate, regular rhythm and intact distal pulses.   Respiratory: Effort normal and breath sounds normal. No respiratory distress. She has no wheezes.  GI: Soft. Bowel sounds are normal. She exhibits no distension. There is no tenderness.  Musculoskeletal: She exhibits no edema.  Is able to move all extremities;   Neurological: She is alert and oriented to person, place, and time.  Skin: Skin is warm and dry. She is not diaphoretic.      ASSESSMENT/ PLAN:  1. Hypertension: is stable will continue  atenolol 50 mg daily and will monitor her status  2. Diabetes: she remains stable will continue her levemir 15 units twice daily and humalog 5 units tid and will monitor   3. Anemia: she is stable will continue nu-iron daily  4. Constipation; will continue colace twice daily and miralax daily prn  5. Right femur fracture: she continues to improve; will continue therapy as directed; will continue her vicodin /325 mg twice daily and robaxin 500 mg tid prn and will continue to monitor her status   6. Afib: her heart rate is stable; will continue long term coumadin therapy  7. Gerd: will continue prilosec 20 mg daily          Synthia Innocent NP [facility]  Contact [phone] Monday through Friday 8am- 5pm  After hours call [phone]

## 2013-09-20 NOTE — Progress Notes (Signed)
Patient ID: Tammy Sharp, female   DOB: 13-Feb-1925, 80 y.o.   MRN: [id number]     [facility]  No Known Allergies   Chief Complaint  Patient presents with  . Acute Visit    coumadin management     HPI:  She is on long term coumadin therapy for afib and status post cva. She is neurologically stable and her heart rate is under control. She is tolerating her coumadin therapy. There are no concerns being voiced by the nursing staff at this time. Her inr today is 1.7 and she is taking coumadin 6 mg daily.    Past Medical History  Diagnosis Date  . Hypertension   . Diabetes mellitus without complication   . Stroke   . A-fib   . 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 Joint replacement    . Fracture surgery    . Orif femur decompression Right 2014    R mid femur fracture, between R hip and kneee prosthesis  . Orif femur fracture Right 06/17/2013    Procedure: OPEN REDUCTION INTERNAL FIXATION (ORIF) DISTAL FEMUR FRACTURE;  Surgeon: Sheral Apleyimothy D Murphy, MD;  Location: [facility] OR;  Service: Orthopedics;  Laterality: Right;    VITAL SIGNS BP 136/68  Pulse 79  Ht 5\' 7"  (1.702 m)  Wt 191 lb 12.8 oz (87 kg)  BMI 30.03 kg/m2   Patient's Medications  New Prescriptions   No medications on file  Previous Medications   ACETAMINOPHEN (TYLENOL) 325 MG TABLET    Take 650 mg by mouth every 6 (six) hours as needed for mild pain.   ATENOLOL (TENORMIN) 50 MG TABLET    Take 50 mg by mouth daily.   DOCUSATE SODIUM (COLACE) 100 MG CAPSULE    Take 100 mg by mouth 2 (two) times daily.   HYDROCODONE-ACETAMINOPHEN (NORCO/VICODIN) 5-325 MG PER TABLET    Take one tablet by mouth twice daily at 8am and 8pm for post surgery pain. Hold for sedation   INSULIN DETEMIR (LEVEMIR) 100 UNIT/ML INJECTION    Inject 15 Units into the skin 2 (two) times daily.   INSULIN LISPRO (HUMALOG) 100 UNIT/ML INJECTION    Inject 5 Units into the skin 3 (three) times  daily before meals.   IRON POLYSACCHARIDES (NIFEREX) 150 MG CAPSULE    Take 150 mg by mouth daily.   LATANOPROST (XALATAN) 0.005 % OPHTHALMIC SOLUTION    Place 1 drop into both eyes at bedtime.   MAGNESIUM OXIDE (MAG-OX) 400 MG TABLET    Take 400 mg by mouth daily.   METHOCARBAMOL (ROBAXIN) 500 MG TABLET    Take 500 mg by mouth 3 (three) times daily as needed for muscle spasms.   NITROGLYCERIN (NITROSTAT) 0.4 MG SL TABLET    Place 0.4 mg under the tongue every 5 (five) minutes as needed for chest pain.   OMEPRAZOLE (PRILOSEC) 20 MG CAPSULE    Take 20 mg by mouth daily.   ONDANSETRON (ZOFRAN) 4 MG TABLET    Take 4 mg by mouth every 8 (eight) hours as needed for nausea or vomiting.   POLYETHYLENE GLYCOL (MIRALAX / GLYCOLAX) PACKET    Take 17 g by mouth daily as needed for moderate constipation or severe constipation.   WARFARIN (COUMADIN) 5 MG TABLET    Take 6 mg by mouth at bedtime.   Modified Medications   No medications on file  Discontinued Medications   No medications on file  SIGNIFICANT DIAGNOSTIC EXAMS  06-16-13: right knee x-ray: Lateral displacement of femoral fracture fixation hardware indicating hardware failure, with protrusion of a surgical screw through the skin. Comminuted distal femoral fracture with 1 full shaft width overlap of the fracture fragments.  06-16-13: right femur x-ray: Prior right hip and knee replacements. Prior ORIF of a distal femoral diaphyseal fracture with displacement of the dominant distal femoral fragment from the plate. Inferior most screw extends has withdrawn from the plate and extends external to distal femoral soft tissues.  06-16-13: 2-d echo: Left ventricle: The cavity size was normal. Wall thickness was normal. Systolic function was normal. The estimated ejection fraction was in the range of 55% to 60%. Wall motion was normal; there were no regional wall motion abnormalities. - Mitral valve: Mild regurgitation. - Right atrium: The atrium was  mildly dilated.  06-16-13: chest x-ray: No active disease.     LABS REVIEWED:   06-14-13: wbc 12.-; hgb 10.8; hct 35.7; mcv 102.9; plt 585; glucose 145; bun 16; creat 0.7; k+3.8; na++141 liver normal albumin 3.1  06-16-13: hgb a1c 5.5 06-18-13: wbc 9.2; hgb 9.8; hct 29.9; mcv 94.3; plt 387; glucose 157; bun 11; creat 0.53; k+4.4; na++136 06-25-13: wbc 5.6; hgb 9.1; hct 32.4 ;mcv 100.3; plt 412; glucose 129; bun 12; creat 0.5; k+3.6; na++144; liver normal albumin 2.8; mag 1.8  INR 1.5  07-12-13: mag 1.9  08-11-13: urine culture: e-coli: septra  09-13-13: wbc 4.5; hgb 12.2; hct 41.0; mcv 95.6.; plt 223; glucose 83; bun 19; creat 0.8; k+4.3; na++143; liver normal albumin 3.1; hgb a1c 6.1      Review of Systems  Constitutional: Negative for malaise/fatigue.  Eyes: Negative for blurred vision.  Respiratory: Negative for cough and shortness of breath.   Cardiovascular: Negative for chest pain, palpitations and leg swelling.  Gastrointestinal: Negative for heartburn, abdominal pain and constipation.  Musculoskeletal: Negative for joint pain and myalgias.  Skin: Negative.   Neurological: Negative for dizziness, weakness and headaches.  Psychiatric/Behavioral: Negative for depression. The patient is not nervous/anxious.      Physical Exam  Constitutional: She is oriented to person, place, and time. She appears well-developed and well-nourished. No distress.  Neck: Neck supple. No JVD present.  Cardiovascular: Normal rate, regular rhythm and intact distal pulses.   Respiratory: Effort normal and breath sounds normal. No respiratory distress. She has no wheezes.  GI: Soft. Bowel sounds are normal. She exhibits no distension. There is no tenderness.  Musculoskeletal: She exhibits no edema.  Is able to move all extremities;   Neurological: She is alert and oriented to person, place, and time.  Skin: Skin is warm and dry. She is not diaphoretic.     ASSESSMENT/ PLAN:  1.  Afi/cva/anticoagulation management: for her inr of 1.7 will increase her coumadin to 7 mg daily and will check inr in one week; will monitor her status.      Synthia Innocent NP [facility]  Contact [phone] Monday through Friday 8am- 5pm  After hours call [phone]

## 2013-09-21 ENCOUNTER — Non-Acute Institutional Stay (SKILLED_NURSING_FACILITY): Payer: Medicare Other | Admitting: Adult Health

## 2013-09-21 ENCOUNTER — Encounter: Payer: Self-pay | Admitting: Adult Health

## 2013-09-21 DIAGNOSIS — E1165 Type 2 diabetes mellitus with hyperglycemia: Secondary | ICD-10-CM

## 2013-09-21 DIAGNOSIS — IMO0001 Reserved for inherently not codable concepts without codable children: Secondary | ICD-10-CM

## 2013-09-21 DIAGNOSIS — I10 Essential (primary) hypertension: Secondary | ICD-10-CM

## 2013-09-21 DIAGNOSIS — S72301A Unspecified fracture of shaft of right femur, initial encounter for closed fracture: Secondary | ICD-10-CM

## 2013-09-21 DIAGNOSIS — S72309A Unspecified fracture of shaft of unspecified femur, initial encounter for closed fracture: Secondary | ICD-10-CM

## 2013-09-21 DIAGNOSIS — D62 Acute posthemorrhagic anemia: Secondary | ICD-10-CM

## 2013-09-21 NOTE — Progress Notes (Signed)
Patient ID: Tammy Sharp, female   DOB: Jul 01, 1925, 80 y.o.   MRN: [id number]     [facility]  No Known Allergies   Chief Complaint  Patient presents with  . Discharge Note    HPI:  She is being discharged to home with home health for pt/ot/nursing/couomadin management/aid after a prolonged rehab for a right femur fracture with complications. She will not need dme. She has a wheelchair and walker at home. She will need prescriptions to be written.    Past Medical History  Diagnosis Date  . Hypertension   . Diabetes mellitus without complication   . Stroke   . A-fib   . 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 Joint replacement    . Fracture surgery    . Orif femur decompression Right 2014    R mid femur fracture, between R hip and kneee prosthesis  . Orif femur fracture Right 06/17/2013    Procedure: OPEN REDUCTION INTERNAL FIXATION (ORIF) DISTAL FEMUR FRACTURE;  Surgeon: Tammy Apleyimothy D Murphy, MD;  Location: [facility] OR;  Service: Orthopedics;  Laterality: Right;    VITAL SIGNS BP 119/80  Pulse 80  Ht 5\' 7"  (1.702 m)  Wt 191 lb 12 oz (86.977 kg)  BMI 30.03 kg/m2   Patient's Medications  New Prescriptions   No medications on file  Previous Medications   ACETAMINOPHEN (TYLENOL) 325 MG TABLET    Take 650 mg by mouth every 6 (six) hours as needed for mild pain.   ATENOLOL (TENORMIN) 50 MG TABLET    Take 50 mg by mouth daily.   DOCUSATE SODIUM (COLACE) 100 MG CAPSULE    Take 100 mg by mouth 2 (two) times daily.   HYDROCODONE-ACETAMINOPHEN (NORCO/VICODIN) 5-325 MG PER TABLET    Take one tablet by mouth twice daily at 8am and 8pm for post surgery pain. Hold for sedation   INSULIN DETEMIR (LEVEMIR) 100 UNIT/ML INJECTION    Inject 15 Units into the skin 2 (two) times daily.   INSULIN LISPRO (HUMALOG) 100 UNIT/ML INJECTION    Inject 5 Units into the skin 3 (three) times daily before meals.   IRON POLYSACCHARIDES  (NIFEREX) 150 MG CAPSULE    Take 150 mg by mouth daily.   LATANOPROST (XALATAN) 0.005 % OPHTHALMIC SOLUTION    Place 1 drop into both eyes at bedtime.   MAGNESIUM OXIDE (MAG-OX) 400 MG TABLET    Take 400 mg by mouth daily.   METHOCARBAMOL (ROBAXIN) 500 MG TABLET    Take 500 mg by mouth 3 (three) times daily as needed for muscle spasms.   NITROGLYCERIN (NITROSTAT) 0.4 MG SL TABLET    Place 0.4 mg under the tongue every 5 (five) minutes as needed for chest pain.   OMEPRAZOLE (PRILOSEC) 20 MG CAPSULE    Take 20 mg by mouth daily.   ONDANSETRON (ZOFRAN) 4 MG TABLET    Take 4 mg by mouth every 8 (eight) hours as needed for nausea or vomiting.   POLYETHYLENE GLYCOL (MIRALAX / GLYCOLAX) PACKET    Take 17 g by mouth daily as needed for moderate constipation or severe constipation.   WARFARIN (COUMADIN) 5 MG TABLET    Take 7 mg by mouth at bedtime.   Modified Medications   No medications on file  Discontinued Medications   No medications on file    SIGNIFICANT DIAGNOSTIC EXAMS  06-16-13: right knee x-ray: Lateral displacement of femoral fracture fixation hardware indicating  hardware failure, with protrusion of a surgical screw through the skin. Comminuted distal femoral fracture with 1 full shaft width overlap of the fracture fragments.  06-16-13: right femur x-ray: Prior right hip and knee replacements. Prior ORIF of a distal femoral diaphyseal fracture with displacement of the dominant distal femoral fragment from the plate. Inferior most screw extends has withdrawn from the plate and extends external to distal femoral soft tissues.  06-16-13: 2-d echo: Left ventricle: The cavity size was normal. Wall thickness was normal. Systolic function was normal. The estimated ejection fraction was in the range of 55% to 60%. Wall motion was normal; there were no regional wall motion abnormalities. - Mitral valve: Mild regurgitation. - Right atrium: The atrium was mildly dilated.  06-16-13: chest x-ray: No  active disease.     LABS REVIEWED:   06-14-13: wbc 12.-; hgb 10.8; hct 35.7; mcv 102.9; plt 585; glucose 145; bun 16; creat 0.7; k+3.8; na++141 liver normal albumin 3.1  06-16-13: hgb a1c 5.5 06-18-13: wbc 9.2; hgb 9.8; hct 29.9; mcv 94.3; plt 387; glucose 157; bun 11; creat 0.53; k+4.4; na++136 06-25-13: wbc 5.6; hgb 9.1; hct 32.4 ;mcv 100.3; plt 412; glucose 129; bun 12; creat 0.5; k+3.6; na++144; liver normal albumin 2.8; mag 1.8  INR 1.5  07-12-13: mag 1.9  08-11-13: urine culture: e-coli: septra  09-13-13: wbc 4.5; hgb 12.2; hct 41.0; mcv 95.6.; plt 223; glucose 83; bun 19; creat 0.8; k+4.3; na++143; liver normal albumin 3.1; hgb a1c 6.1      Review of Systems  Constitutional: Negative for malaise/fatigue.  Eyes: Negative for blurred vision.  Respiratory: Negative for cough and shortness of breath.   Cardiovascular: Negative for chest pain, palpitations and leg swelling.  Gastrointestinal: Negative for heartburn, abdominal pain and constipation.  Musculoskeletal: Negative for joint pain and myalgias.  Skin: Negative.   Neurological: Negative for dizziness, weakness and headaches.  Psychiatric/Behavioral: Negative for depression. The patient is not nervous/anxious.      Physical Exam  Constitutional: She is oriented to person, place, and time. She appears well-developed and well-nourished. No distress.  Neck: Neck supple. No JVD present.  Cardiovascular: Normal rate, regular rhythm and intact distal pulses.   Respiratory: Effort normal and breath sounds normal. No respiratory distress. She has no wheezes.  GI: Soft. Bowel sounds are normal. She exhibits no distension. There is no tenderness.  Musculoskeletal: She exhibits no edema.  Is able to move all extremities;   Neurological: She is alert and oriented to person, place, and time.  Skin: Skin is warm and dry. She is not diaphoretic.      ASSESSMENT/ PLAN:  Will discharge to home with home health for  pt/ot/nursing/coumadin management/aid. Will not need dme; prescriptions have been written.   Time spent with patient 40 minutes.     Synthia Innocent NP [facility]  Contact [phone] Monday through Friday 8am- 5pm  After hours call [phone]

## 2014-06-15 DIAGNOSIS — I83009 Varicose veins of unspecified lower extremity with ulcer of unspecified site: Secondary | ICD-10-CM

## 2014-06-15 DIAGNOSIS — L97909 Non-pressure chronic ulcer of unspecified part of unspecified lower leg with unspecified severity: Secondary | ICD-10-CM

## 2014-06-15 DIAGNOSIS — G8929 Other chronic pain: Secondary | ICD-10-CM

## 2014-06-15 DIAGNOSIS — E119 Type 2 diabetes mellitus without complications: Secondary | ICD-10-CM

## 2014-06-15 DIAGNOSIS — F0391 Unspecified dementia with behavioral disturbance: Secondary | ICD-10-CM

## 2014-06-15 DIAGNOSIS — N183 Chronic kidney disease, stage 3 (moderate): Secondary | ICD-10-CM

## 2014-06-15 DIAGNOSIS — K219 Gastro-esophageal reflux disease without esophagitis: Secondary | ICD-10-CM

## 2014-11-22 IMAGING — CR DG FEMUR 2+V*R*
4 series · 4 of 4 positions shown · non-contrast
Comparison: None

CLINICAL DATA: Recent surgery, fall on 06/01/2013, screw poking os
skin, draining pus and blood, history hypertension, diabetes

EXAM:
RIGHT FEMUR - 2 VIEW

[x femur distal lat right (1 of 2)]
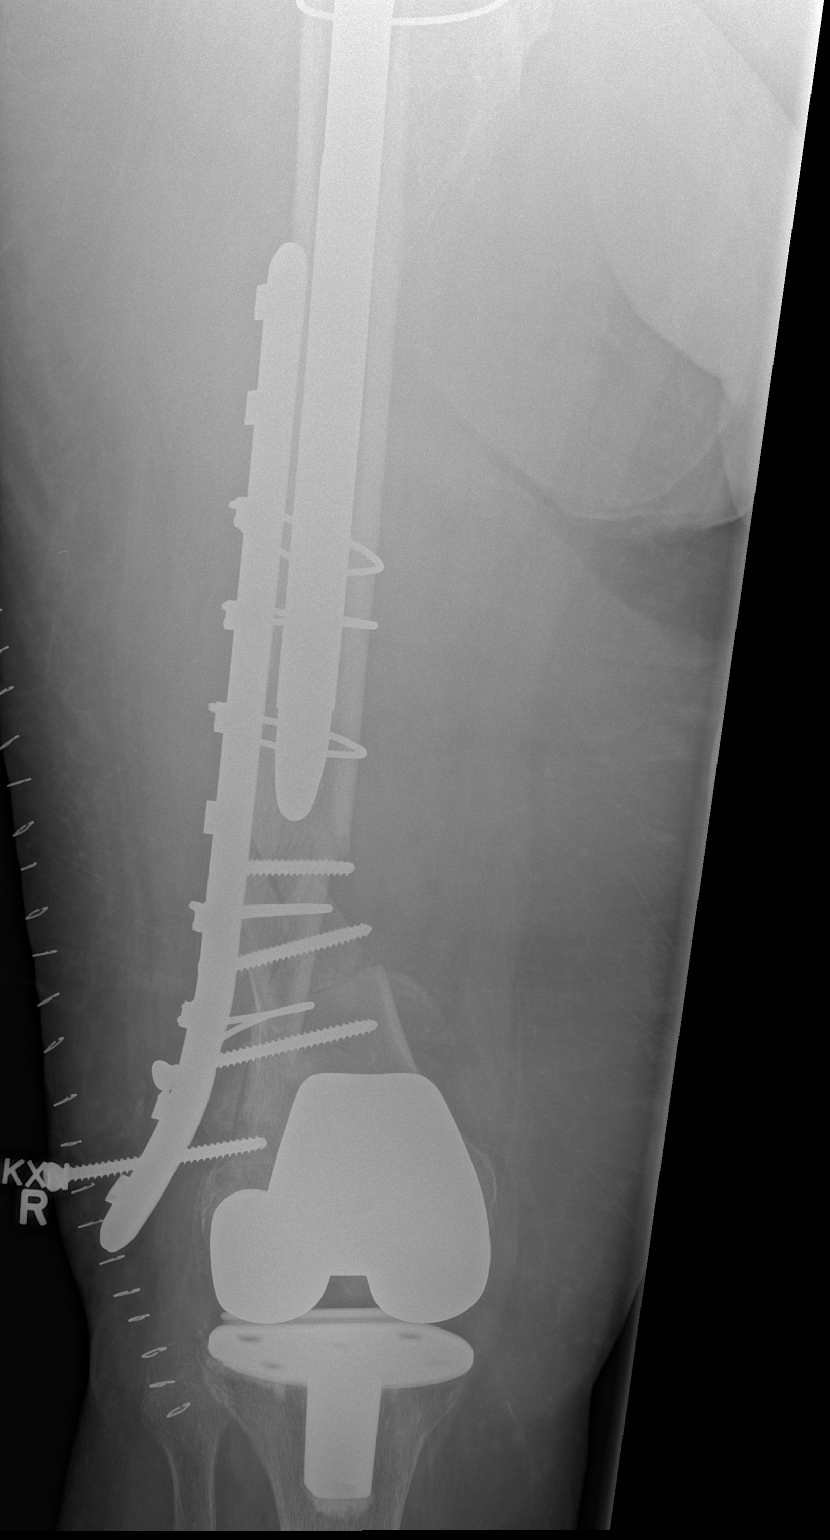

[x femur distal lat right (2 of 2)]
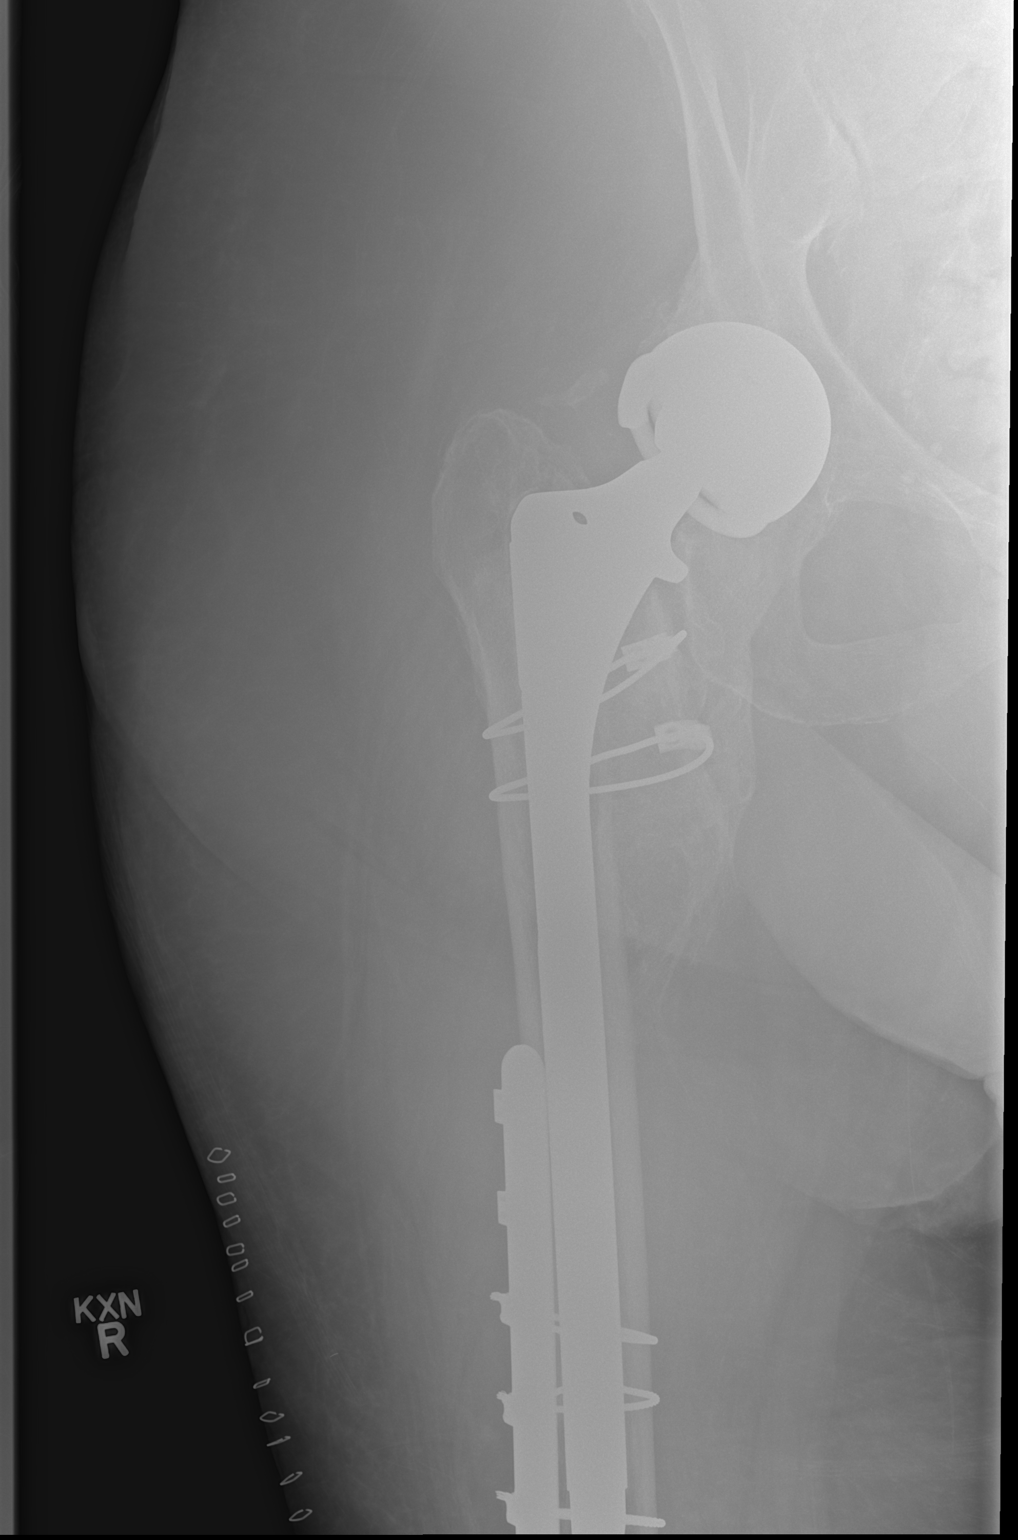

[w femur distal lat right (1 of 2)]
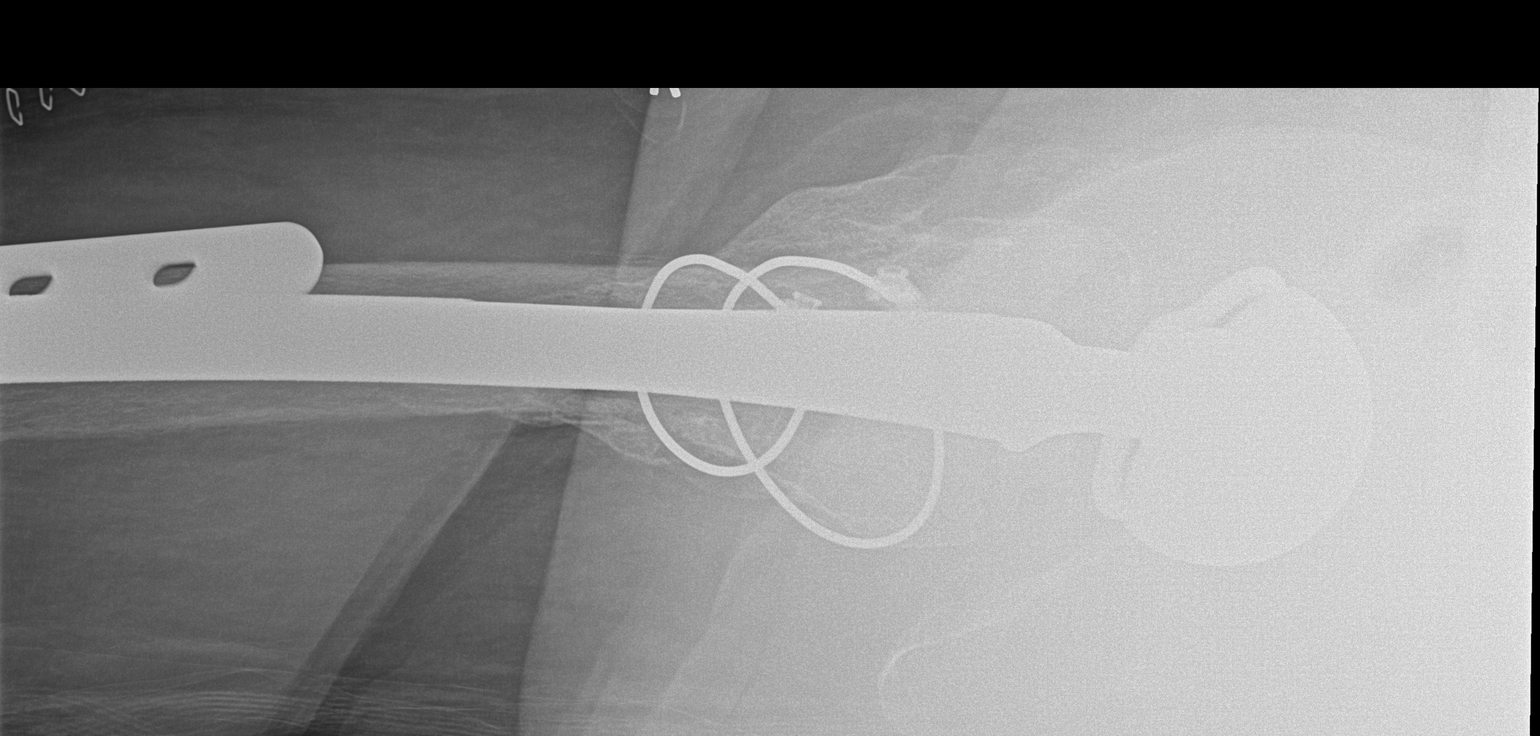

[w femur distal lat right (2 of 2)]
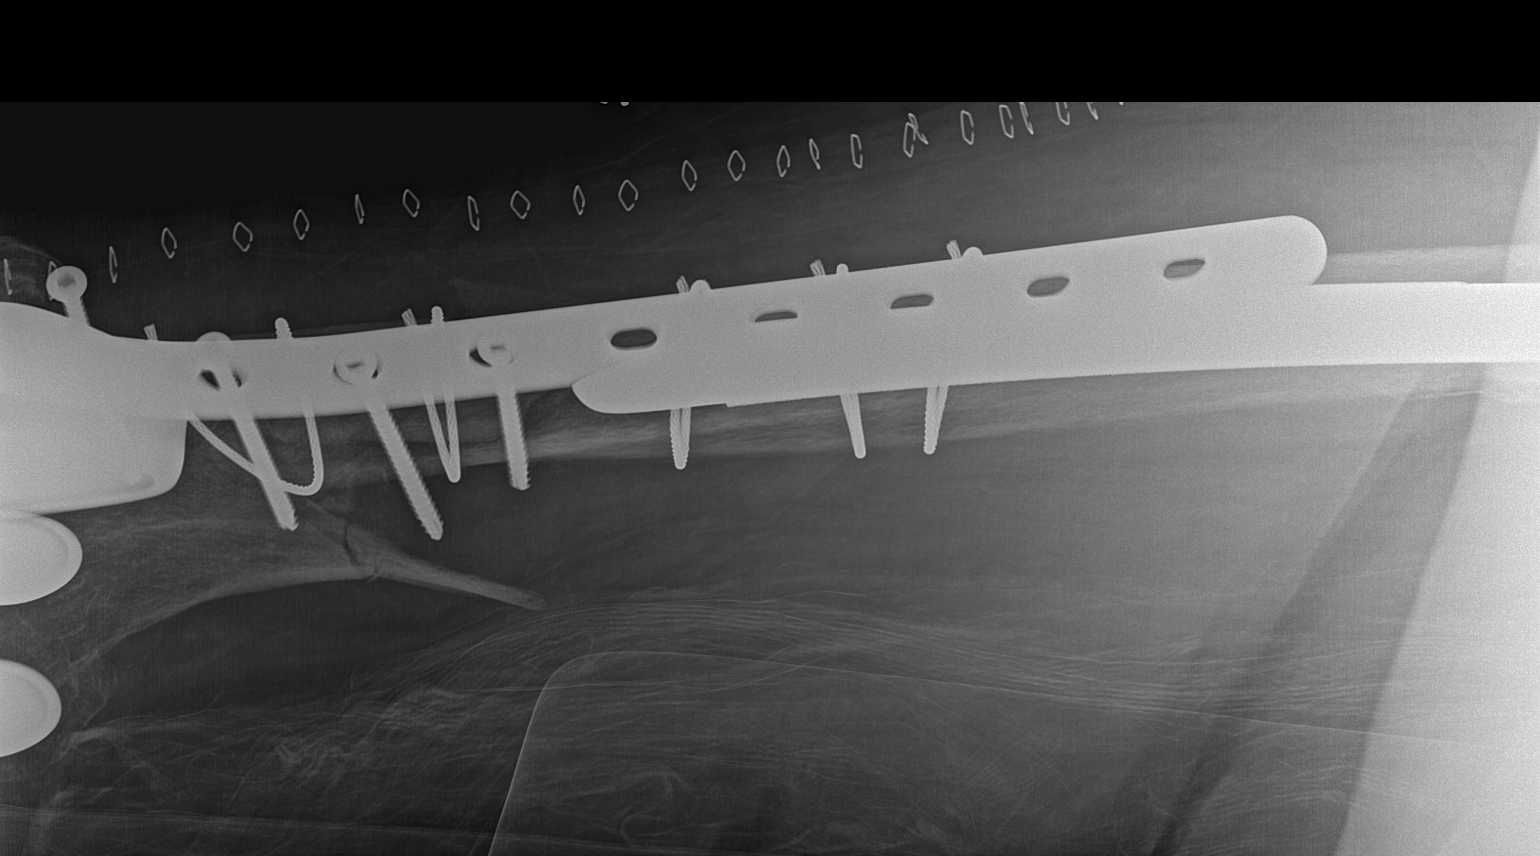

[4 of 4 positions shown; findings below may reference images not displayed]

FINDINGS: Long stem of a right femoral prosthesis extends to the distal femur.

Components of the right knee prosthesis are identified in expected
positions.

Lateral plate with cerclage wires and 4 distal screws identified
post ORIF of a distal femoral diaphyseal fracture.

Fracture demonstrates mild apex lateral angulation and slight medial
displacement.

Inferior most screw has withdrawn from the plate and extends to the
skin surface.

A gap is present between the lateral plate and the distal femur.

Visualized right pelvis intact.

Soft tissue swelling and skin clips identified at the distal thigh
to the knee.
IMPRESSION: Prior right hip and knee replacements.

Prior ORIF of a distal femoral diaphyseal fracture with displacement
of the dominant distal femoral fragment from the plate.

Inferior most screw extends has withdrawn from the plate and extends
external to distal femoral soft tissues.

## 2014-11-23 IMAGING — CR DG FEMUR 2+V PORT*R*
1 series · 1 of 1 positions shown · non-contrast
Comparison: Intraoperative radiographs obtained earlier today,
preoperative radiopaque grafts 06/16/2013

CLINICAL DATA: Postoperative evaluation

EXAM:
PORTABLE RIGHT FEMUR - 2 VIEW

[AP]
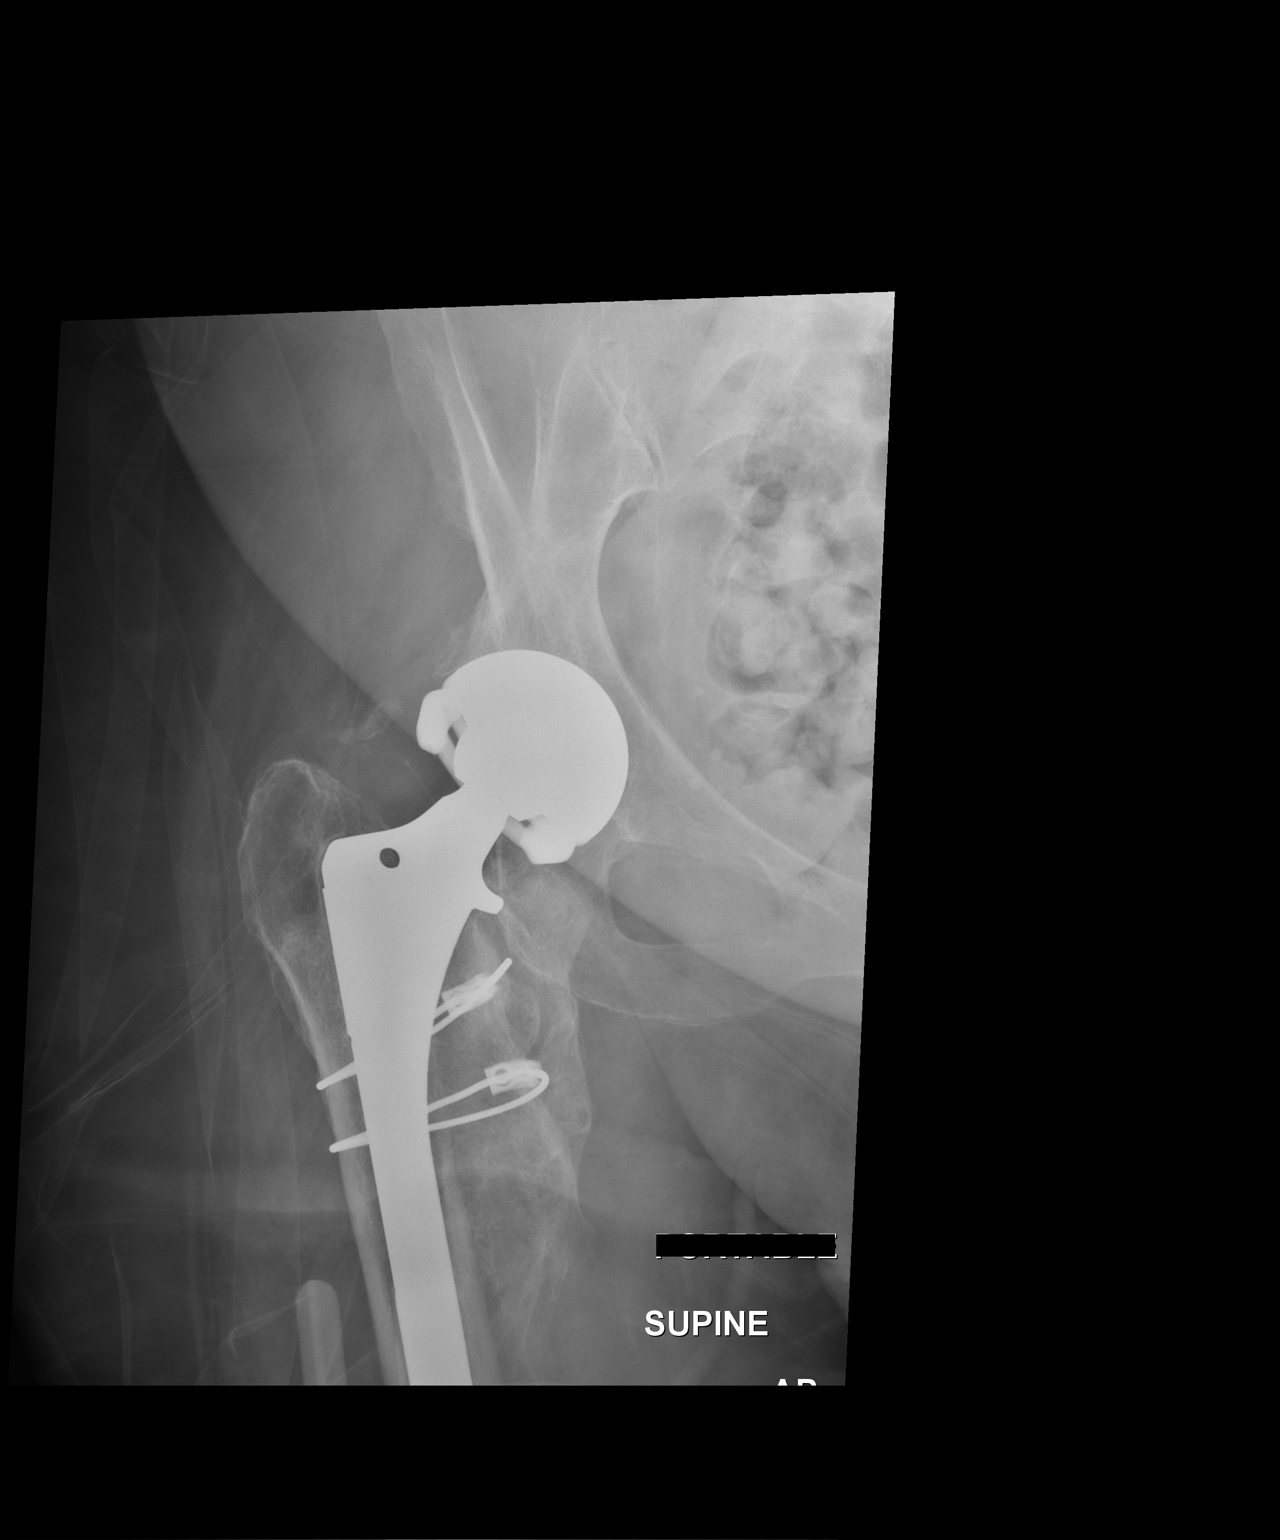

[1 of 1 positions shown; findings below may reference images not displayed]

FINDINGS: Revision ORIF of complex and comminuted distal femoral fracture with
a new lateral buttress plate, screw and cerclage wire construct.
Displacement of the fracture fragments has improved compared to the
preoperative study. Additional surgical changes of total knee
arthroplasty and total knee arthroplasty are again noted.
Heterotopic ossification about the lesser trochanter of the femur
has not significantly changed in the short interval since the prior
study. Overall, the bones appear osteopenic.
IMPRESSION: Revision ORIF of complex comminuted distal femoral fracture with a
new lateral buttress plate, screw and cerclage construct. Alignment
of the displaced fracture fragments has improved compared to the
preoperative radiographs.

## 2016-01-06 DEATH — deceased

## 2020-08-22 ENCOUNTER — Encounter (INDEPENDENT_AMBULATORY_CARE_PROVIDER_SITE_OTHER): Payer: Self-pay
